# Patient Record
Sex: Female | Born: 1975 | Race: Black or African American | Hispanic: No | Marital: Married | Smoking: Never smoker
Health system: Southern US, Community
[De-identification: ages and names within clinical notes are randomized; demographics above are authoritative.]

## PROBLEM LIST (undated history)

## (undated) DIAGNOSIS — R011 Cardiac murmur, unspecified: Secondary | ICD-10-CM

## (undated) DIAGNOSIS — I10 Essential (primary) hypertension: Secondary | ICD-10-CM

## (undated) DIAGNOSIS — I341 Nonrheumatic mitral (valve) prolapse: Secondary | ICD-10-CM

## (undated) DIAGNOSIS — R87619 Unspecified abnormal cytological findings in specimens from cervix uteri: Secondary | ICD-10-CM

## (undated) DIAGNOSIS — K219 Gastro-esophageal reflux disease without esophagitis: Secondary | ICD-10-CM

## (undated) DIAGNOSIS — E669 Obesity, unspecified: Secondary | ICD-10-CM

## (undated) DIAGNOSIS — IMO0002 Reserved for concepts with insufficient information to code with codable children: Secondary | ICD-10-CM

## (undated) DIAGNOSIS — D649 Anemia, unspecified: Secondary | ICD-10-CM

## (undated) DIAGNOSIS — D259 Leiomyoma of uterus, unspecified: Secondary | ICD-10-CM

## (undated) HISTORY — DX: Anemia, unspecified: D64.9

## (undated) HISTORY — DX: Cardiac murmur, unspecified: R01.1

## (undated) HISTORY — DX: Unspecified abnormal cytological findings in specimens from cervix uteri: R87.619

## (undated) HISTORY — DX: Obesity, unspecified: E66.9

## (undated) HISTORY — DX: Essential (primary) hypertension: I10

## (undated) HISTORY — PX: LEEP: SHX91

## (undated) HISTORY — DX: Reserved for concepts with insufficient information to code with codable children: IMO0002

## (undated) HISTORY — DX: Nonrheumatic mitral (valve) prolapse: I34.1

## (undated) HISTORY — DX: Leiomyoma of uterus, unspecified: D25.9

## (undated) HISTORY — PX: WISDOM TOOTH EXTRACTION: SHX21

## (undated) HISTORY — DX: Gastro-esophageal reflux disease without esophagitis: K21.9

---

## 2005-03-28 ENCOUNTER — Observation Stay: Payer: Self-pay | Admitting: Obstetrics and Gynecology

## 2005-03-28 ENCOUNTER — Ambulatory Visit: Payer: Self-pay | Admitting: Obstetrics and Gynecology

## 2005-05-17 ENCOUNTER — Observation Stay: Payer: Self-pay | Admitting: Obstetrics and Gynecology

## 2005-05-20 ENCOUNTER — Inpatient Hospital Stay: Payer: Self-pay | Admitting: Obstetrics and Gynecology

## 2006-04-22 ENCOUNTER — Ambulatory Visit: Payer: Self-pay | Admitting: Family Medicine

## 2006-05-01 ENCOUNTER — Ambulatory Visit (HOSPITAL_COMMUNITY): Admission: RE | Admit: 2006-05-01 | Discharge: 2006-05-01 | Payer: Self-pay | Admitting: Gynecology

## 2006-05-19 ENCOUNTER — Ambulatory Visit: Payer: Self-pay | Admitting: Family Medicine

## 2006-06-11 ENCOUNTER — Ambulatory Visit (HOSPITAL_COMMUNITY): Admission: RE | Admit: 2006-06-11 | Discharge: 2006-06-11 | Payer: Self-pay | Admitting: Family Medicine

## 2006-06-16 ENCOUNTER — Ambulatory Visit: Payer: Self-pay | Admitting: Family Medicine

## 2006-07-10 ENCOUNTER — Ambulatory Visit: Payer: Self-pay | Admitting: Family Medicine

## 2006-07-14 ENCOUNTER — Ambulatory Visit: Payer: Self-pay | Admitting: Family Medicine

## 2006-07-17 ENCOUNTER — Ambulatory Visit (HOSPITAL_COMMUNITY): Admission: RE | Admit: 2006-07-17 | Discharge: 2006-07-17 | Payer: Self-pay | Admitting: Gynecology

## 2006-08-11 ENCOUNTER — Ambulatory Visit: Payer: Self-pay | Admitting: Family Medicine

## 2006-09-10 ENCOUNTER — Ambulatory Visit: Payer: Self-pay | Admitting: Obstetrics & Gynecology

## 2006-09-10 ENCOUNTER — Ambulatory Visit (HOSPITAL_COMMUNITY): Admission: RE | Admit: 2006-09-10 | Discharge: 2006-09-10 | Payer: Self-pay | Admitting: Family Medicine

## 2006-09-21 ENCOUNTER — Ambulatory Visit: Payer: Self-pay | Admitting: Gynecology

## 2006-09-30 ENCOUNTER — Ambulatory Visit: Payer: Self-pay | Admitting: Obstetrics and Gynecology

## 2006-09-30 ENCOUNTER — Inpatient Hospital Stay (HOSPITAL_COMMUNITY): Admission: AD | Admit: 2006-09-30 | Discharge: 2006-09-30 | Payer: Self-pay | Admitting: Family Medicine

## 2006-10-06 ENCOUNTER — Ambulatory Visit: Payer: Self-pay | Admitting: Gynecology

## 2006-10-17 ENCOUNTER — Inpatient Hospital Stay (HOSPITAL_COMMUNITY): Admission: AD | Admit: 2006-10-17 | Discharge: 2006-10-18 | Payer: Self-pay | Admitting: Obstetrics & Gynecology

## 2006-10-17 ENCOUNTER — Ambulatory Visit: Payer: Self-pay | Admitting: Physician Assistant

## 2006-10-19 ENCOUNTER — Ambulatory Visit: Payer: Self-pay | Admitting: Gynecology

## 2006-11-11 ENCOUNTER — Ambulatory Visit: Payer: Self-pay | Admitting: Obstetrics & Gynecology

## 2006-11-18 ENCOUNTER — Ambulatory Visit: Payer: Self-pay | Admitting: Obstetrics & Gynecology

## 2006-11-26 ENCOUNTER — Ambulatory Visit: Payer: Self-pay | Admitting: Obstetrics & Gynecology

## 2006-12-03 ENCOUNTER — Ambulatory Visit: Payer: Self-pay | Admitting: Obstetrics & Gynecology

## 2006-12-04 ENCOUNTER — Ambulatory Visit (HOSPITAL_COMMUNITY): Admission: RE | Admit: 2006-12-04 | Discharge: 2006-12-04 | Payer: Self-pay | Admitting: Obstetrics & Gynecology

## 2006-12-08 ENCOUNTER — Ambulatory Visit: Payer: Self-pay | Admitting: Physician Assistant

## 2006-12-08 ENCOUNTER — Inpatient Hospital Stay (HOSPITAL_COMMUNITY): Admission: AD | Admit: 2006-12-08 | Discharge: 2006-12-10 | Payer: Self-pay | Admitting: Obstetrics & Gynecology

## 2007-01-21 ENCOUNTER — Ambulatory Visit: Payer: Self-pay | Admitting: Obstetrics & Gynecology

## 2007-02-16 ENCOUNTER — Ambulatory Visit: Payer: Self-pay | Admitting: Family Medicine

## 2007-02-25 ENCOUNTER — Ambulatory Visit: Payer: Self-pay | Admitting: Obstetrics & Gynecology

## 2008-01-10 ENCOUNTER — Ambulatory Visit: Payer: Self-pay | Admitting: Family Medicine

## 2008-04-03 ENCOUNTER — Ambulatory Visit: Payer: Self-pay | Admitting: Family Medicine

## 2008-04-13 ENCOUNTER — Encounter: Admission: RE | Admit: 2008-04-13 | Discharge: 2008-04-13 | Payer: Self-pay | Admitting: Family Medicine

## 2008-04-21 ENCOUNTER — Encounter: Admission: RE | Admit: 2008-04-21 | Discharge: 2008-04-21 | Payer: Self-pay | Admitting: Family Medicine

## 2008-04-21 ENCOUNTER — Ambulatory Visit: Payer: Self-pay | Admitting: Family Medicine

## 2008-07-19 ENCOUNTER — Ambulatory Visit: Payer: Self-pay | Admitting: Obstetrics and Gynecology

## 2008-10-05 ENCOUNTER — Ambulatory Visit: Payer: Self-pay | Admitting: Family Medicine

## 2008-11-06 ENCOUNTER — Ambulatory Visit: Payer: Self-pay | Admitting: Family Medicine

## 2009-01-03 ENCOUNTER — Ambulatory Visit: Payer: Self-pay | Admitting: Obstetrics and Gynecology

## 2009-04-10 ENCOUNTER — Ambulatory Visit: Payer: Self-pay | Admitting: Obstetrics & Gynecology

## 2009-05-22 ENCOUNTER — Ambulatory Visit: Payer: Self-pay | Admitting: Family Medicine

## 2010-06-12 ENCOUNTER — Ambulatory Visit: Payer: Self-pay | Admitting: Family Medicine

## 2010-06-17 ENCOUNTER — Ambulatory Visit: Payer: Self-pay | Admitting: Obstetrics and Gynecology

## 2010-07-18 ENCOUNTER — Ambulatory Visit: Payer: Self-pay

## 2010-11-19 NOTE — Assessment & Plan Note (Signed)
NAME:  [name], [name]                 ACCOUNT NO.:  [id number]   MEDICAL RECORD NO.:  [id number]          PATIENT TYPE:  POB   LOCATION:  [facility] at [facility]         FACILITY:  [facility]   PHYSICIAN:  Tinnie Gens, MD        DATE OF BIRTH:  [birth year]   DATE OF SERVICE:  06/12/2010                                  CLINIC NOTE   CHIEF COMPLAINT:  Yearly exam.   HISTORY OF PRESENT ILLNESS:  The patient is a 35 year old gravida 5,  para 5 who comes in today for yearly exam.  She reports that her cycles  are coming every 2-3 months and then she has heavy cycles, which she  says she has always had heavy cycles and these are not new, just is  skipping a month or two in between them is new.  She does have a known  history of fibroid uterus.  The patient also has a strong family history  of breast cancer.  The patient's partner has had a vasectomy and so  birth control is no longer needed.   PAST MEDICAL HISTORY:  She has a history of heart murmur with a normal  echo and cardiac eval.   PAST SURGICAL HISTORY:  Negative.   MEDICATIONS:  None.   ALLERGIES:  None known.   OBSTETRICAL HISTORY:  She is a G5, P5 with 5 vaginal deliveries.   GYNECOLOGIC HISTORY:  History of a LEEP in 1990 with subsequently normal  Pap smears.  No history of STDs.   FAMILY HISTORY:  Mom was diagnosed with breast cancer at age 15,  deceased at age 54.  Maternal aunt diagnosed with breast cancer, also  deceased at age 10.  The patient has regular mammograms, the last of  which was October 2010.  She is due for this again.   REVIEW OF SYSTEMS:  A 14-point review of systems reviewed.  She denies  chest pain, shortness of breath, nausea, vomiting, diarrhea,  constipation, trouble with her vision, trouble with hearing, swelling of  feet or ankles, masses in her breast.   PHYSICAL EXAMINATION:  VITAL SIGNS:  Her vitals are as noted in the  chart.  Weight is 171 pounds.  GENERAL:  She is well-developed, well-nourished  female in no acute  distress.  HEENT:  Normocephalic, atraumatic.  Sclerae anicteric.  NECK:  Supple.  Normal thyroid.  LUNGS:  Clear bilaterally.  CV:  Regular rate and rhythm without rubs, gallops, or murmurs.  ABDOMEN:  Soft, nontender, nondistended.  EXTREMITIES:  No cyanosis, clubbing, or edema.  BREASTS:  Symmetric with everted nipples.  No supraclavicular or  axillary adenopathy.  GU:  Normal external female genitalia.  BUS is normal.  Vagina is pink  and rugated.  Cervix is anterior.  The uterus is enlarged and firm, it  is probably about 8 weeks' size, was retroverted.  There are no adnexal  mass or tenderness.   IMPRESSION:  1. Gynecologic exam.  2. Strong family history of breast cancer.  3. Oligomenorrhea of unclear etiology.   PLAN:  1. Fasting TSH, lipid, CBC, and CMP.  2. Pap smear today.  3. Routine mammogram.  4.  Provera 10 mg one p.o. daily for 5 days every other month to ensure      she has a cycle.  5. After lengthy discussion has had with her, I think she should have      BRCA testing.  She is given information on this and hopefully, she      will return for that testing.           ______________________________  Tinnie Gens, MD     TP/MEDQ  D:  06/12/2010  T:  06/13/2010  Job:  [id number]

## 2010-11-19 NOTE — Assessment & Plan Note (Signed)
[name], [name]                 ACCOUNT NO.:  [id number]   MEDICAL RECORD NO.:  [id number]          PATIENT TYPE:  POB   LOCATION:  [facility]         FACILITY:  [facility]   PHYSICIAN:  Jaynie Collins, MD     DATE OF BIRTH:  1975/08/28   DATE OF SERVICE:  04/10/2009                                  CLINIC NOTE   The patient is a 35 year old gravida 5, para 5 who is here for her  annual examination.  The patient was last seen in January 03, 2009 when she  received her Depo-Provera injection that was her last ejection given  that her husband received a vasectomy in July.  Since around September,  the patient had noted that which her last menstrual period has started  on March 12, 2009, she had 3 weeks of spotting and increased  abdominal cramping and she continues to have abdominal cramping right  now.  No rebound or guarding but the spotting has resolved.  She is  worried about this irregular bleeding.  The patient is scheduled for her  next mammogram on April 27, 2009.  She does have significant family  history of early onset breast cancer.   PAST OB/GYN HISTORY:  The patient is a gravida 5, para 5 with five  vaginal deliveries.  She had regular menstrual periods until she has  been off Depo-Provera in the last few months.  She has a history of a  LEEP in 1990 and subsequent Pap smears were normal including the one on  April 03, 2008.  Her husband got a vasectomy for contraception.  She  is sexually active only with her husband and denies any history of  sexually transmitted infections.   PAST MEDICAL HISTORY:  History of heart murmur but had a normal  echocardiogram and cardiac evaluation.   PAST SURGICAL HISTORY:  Negative.   MEDICATIONS:  None.   ALLERGIES:  No known drug allergies.   SOCIAL HISTORY:  The patient works for [employer].  She denies any tobacco,  alcohol or illicit drug use.  She denies any physical, emotional or  sexual abuse.   FAMILY HISTORY:   Remarkable for her mom being diagnosed with breast  cancer in her 29s and passing away at age 69 and her maternal aunt being  diagnosed with breast cancer and also died of it at age 57.  The patient  has been having regular mammograms.  Her last one was in April 13, 2008, which was normal.  She is scheduled for one on April 27, 2009.  The patient denies any other family history of ovarian cancer or colon  cancer or any other gynecologic cancer.  There is also an extensive  family history of diabetes and hypertension.   REVIEW OF SYSTEMS:  A comprehensive review of systems was reviewed with  the patient and was entirely negative apart from what was mentioned in  the history of present illness.   PHYSICAL EXAMINATION:  VITAL SIGNS:  Pulse is 88, blood pressure 140/77,  weight 169, height 5 feet.  GENERAL:  No apparent distress.  HEENT:  Normocephalic, atraumatic.  NECK:  Supple.  No masses.  Normal thyroid.  LUNGS:  Clear to auscultation bilaterally.  HEART:  Regular rate and rhythm.  BREASTS:  Symmetric and soft, nontender, no abnormal masses, skin  changes, nipple discharge or lymphadenopathy noted.  ABDOMEN:  Soft,  nontender, nondistended.  No organomegaly.  There was mild suprapubic  tenderness on palpation.  EXTREMITIES:  No cyanosis, clubbing or edema.  PELVIC:  Normal external genitalia, pink, well-rugated vagina,  multiparous cervix noted.  Pap smear was obtained.  We will check a  reflex gonorrhea and chlamydia from the Pap smear sample.  On bimanual  exam, the patient has small retroverted mobile uterus and nontender  adnexae.  No abnormal masses palpated.  There was some mild tenderness  on palpation of her uterus.   ASSESSMENT AND PLAN:  The patient is a 33-year gravida 5, para 5, here  for annual exam.  The patient had a normal breast examination.  We will  follow up on the mammogram especially giving her family history of  breast cancer.  The patient is  contemplating BRCA analysis but has not  made a discussion about this yet.  We will also follow up on the Pap  smear and gonorrhea and chlamydia screen and the patient's abnormal  bleeding is likely due to her bodies getting back to normal after being  on Depo-Provera for a while.  She was told that it was an unusual to  have abnormal bleeding or cramping as the body tries to get back to  normal after a prolonged course of hormonal therapy.  However, we will  check a urinalysis and also check a pelvic ultrasound to rule out any  structural anomalies that he causing her symptoms.  The patient is  agreeable to this plan.  The patient was told to follow up for any  further gynecologic concerns or in 1 year for annual examination.           ______________________________  Jaynie Collins, MD     UA/MEDQ  D:  04/10/2009  T:  04/11/2009  Job:  [id number]

## 2010-11-19 NOTE — Assessment & Plan Note (Signed)
[name], [name]                 ACCOUNT NO.:  [id number]   MEDICAL RECORD NO.:  [id number]           PATIENT TYPE:   LOCATION:  [facility]           FACILITY:   PHYSICIAN:  Tinnie Gens, MD        DATE OF BIRTH:  11-22-1975   DATE OF SERVICE:  11/06/2008                                  CLINIC NOTE   CHIEF COMPLAINT:  Tubal consult.   HISTORY OF PRESENT ILLNESS:  The patient is a 35 year old, gravida 5,  para 5, who desires permanent sterility.  She has been counseled  regarding risks and benefits of procedure and desires to have it done.  The patient has been on Depo, her last shot was on October 05, 2008.   PAST MEDICAL HISTORY:  History of heart murmur with a normal echo.   PAST SURGICAL HISTORY:  Negative.   MEDICATIONS:  Depo-Provera 150 mg every 3 months.   ALLERGIES:  None known.   OBSTETRICAL HISTORY:  G5, P5, 5 vaginal deliveries.   GYNECOLOGIC HISTORY:  History of LEEP in 1990.  Last Pap on September  2009 which was within normal limits.   FAMILY HISTORY:  Diabetes, hypertension, breast cancer.   SOCIAL HISTORY:  No tobacco, alcohol, or drug use.  She works for  [employer].  She has her own insurance.   On exam, vitals are as noted in the chart.  She is a well-developed,  well-nourished female in no acute distress.  Blood pressure 140/75.  Her  abdomen is soft, nontender, nondistended.   IMPRESSION:  Tubal consult.   PLAN:  We will schedule for tubal.  The patient was counseled regarding  risks and benefits of the procedure including risk of bleeding,  infection, injury to surrounding organs, what to expect during surgery,  risk of failure as 1  in 100, increased risk of ectopic should it fail, and the permanency of  the procedure.  The patient understand these risks.  She will be  scheduled in the next 1-2 weeks prior to __________.            ______________________________  Tinnie Gens, MD     TP/MEDQ  D:  11/06/2008  T:  11/07/2008  Job:   [id number]

## 2010-11-19 NOTE — Assessment & Plan Note (Signed)
NAMEJASMEN, [name]                 ACCOUNT NO.:  [id number]   MEDICAL RECORD NO.:  [id number]          PATIENT TYPE:  POB   LOCATION:  [facility]         FACILITY:  [facility]   PHYSICIAN:  Tinnie Gens, MD        DATE OF BIRTH:  02/01/1976   DATE OF SERVICE:  04/03/2008                                  CLINIC NOTE   CHIEF COMPLAINT:  Yearly exam.   HISTORY OF PRESENT ILLNESS:  The patient is a 35 year old gravida 5,  para 5 who is here for yearly exam.  She is without significant  complaint today.  The patient is using Depo for contraception which  seems to be working well since her IUD fell out in July 2009.  The  patient has a strong family history of breast cancer in her mother and a  maternal aunt, both of whom died over diagnosed prior to age 37.   PAST MEDICAL HISTORY:  History of heart murmur.  She had an echo which  was reportedly normal.   PAST SURGICAL HISTORY:  Negative.   MEDICATIONS:  She is on iron one p.o. daily, Depo-Provera 150 mg every 3  months.   ALLERGIES:  None known.   OBSTETRICAL HISTORY:  She is a G5, P 5 with 5 vaginal deliveries.   GYN HISTORY:  History of LEEP in 1990, last Pap was May 19, 2006  which was within normal limits.   FAMILY HISTORY:  Diabetes, hypertension, breast cancer.  Mom is deceased  at age 74 diagnosed at 36-15 years of age.  Aunt deceased at age 62 also  from her mother's side.  No ovarian cancer.  No BRCA testing done.   SOCIAL HISTORY:  No tobacco, alcohol, or drug use.  She works for  [employer].   REVIEW OF SYSTEMS:  A 14-point review of systems is reviewed and is  positive for constipation, hair falling out for approximately 1 year,  hot and cold flashes with increased fatigue recently.  The patient is  unsure if her constipation is related to iron shots.  The patient denies  chest pain, shortness of breath, breast lump or masses, vaginal  discharge.  She has had no cycle since starting her Depo-Provera.  She  denies dysuria, hematuria, blood in her stools, numbness, weakness,  fevers, chills.   PHYSICAL EXAMINATION:  VITAL SIGNS:  As noted in the chart.  Weight is  156 which is down 8 pounds from her IUD was inserted.  Blood pressure  139/75.  GENERAL:  She is a well-nourished and well-nourished female in no acute  distress.  NECK:  Supple.  Normal thyroid.  LUNGS:  Clear bilaterally.  CV:  Regular rate and rhythm. No rubs, gallops, or murmurs.  ABDOMEN:  Soft, nontender, and nondistended.  EXTREMITIES:  No cyanosis, clubbing, or edema.  BREASTS:  Symmetric with everted nipples.  No masses.  No  supraclavicular or axillary adenopathy.  GU:  Normal external female genitalia.  BUS is normal.  Vagina is pink  and rugated.  Cervix is parous without lesions.  Uterus is small,  anteverted.  No adnexal mass or tenderness with good exam possible.  IMPRESSION:  1. Yearly exam.  2. Fatigue and alopecia  3. Strong family history of breast cancer.   PLAN:  1. Pap smear today.  2. Check CBC and TSH.  3. Screening mammogram.  4. The patient should continue with yearly pelvic so she was given      information about BRCA testing.  5. The patient was told strictly to do self-breast check monthly.           ______________________________  Tinnie Gens, MD     TP/MEDQ  D:  04/03/2008  T:  04/04/2008  Job:  [id number]

## 2010-11-19 NOTE — Assessment & Plan Note (Signed)
NAMEELISABEL, [name]                 ACCOUNT NO.:  [id number]   MEDICAL RECORD NO.:  [id number]          PATIENT TYPE:  POB   LOCATION:  [facility]         FACILITY:  [facility]   PHYSICIAN:  Elsie Lincoln, MD      DATE OF BIRTH:  1975-12-20   DATE OF SERVICE:  01/21/2007                                  CLINIC NOTE   HISTORY OF PRESENT ILLNESS:  The patient is a 35 year old female who  delivered vaginally, 8+ pound infant approximately 6 weeks ago. She had,  what we thought was gestational hypertension, however, now her blood  pressure is 137/85, so she could be a borderline hypertensive. The  patient is instructed to followup yearly with her primary care physician  to make sure that she does not develop hypertension. She is also  supposed to get a tubal ligation, however, she kept getting bumped for  emergency cesarean section, if she opted for a Mirena. We discussed that  today in detail and she thinks that it would be a good option for her.  The patient is not experiencing any depression. She is not breast  feeding. She is amenorrheic secondary to having a shot of Depo before  discharge.   PHYSICAL EXAMINATION:  ABDOMEN:  Soft, nontender, and nondistended. No  rebound, no guarding.  PELVIC:  Genitalia tender at 5. Vagina slightly atrophic. Cervix closed  and nontender .Uterus slightly enlarged, nontender. Adnexa, no masses,  nontender.   ASSESSMENT/PLAN:  A 35 year old female with normal postpartum  examination.  1. The patient for Mirena IUD. GC and chlamydia cultures were negative      in May. We will not repeat those.  2. Return to clinic in 2 to 3 weeks and pre-medicate with Motrin prior      to getting the Mirena.           ______________________________  Elsie Lincoln, MD     KL/MEDQ  D:  01/21/2007  T:  01/21/2007  Job:  [id number]

## 2010-11-19 NOTE — Assessment & Plan Note (Signed)
NAMEROBINA, HAMOR                 ACCOUNT NO.:  [id number]   MEDICAL RECORD NO.:  [id number]          PATIENT TYPE:  POB   LOCATION:  [facility]         FACILITY:  [facility]   PHYSICIAN:  Tinnie Gens, MD        DATE OF BIRTH:  05/17/1976   DATE OF SERVICE:  05/22/2009                                  CLINIC NOTE   CHIEF COMPLAINT:  Followup results.   HISTORY OF PRESENT ILLNESS:  The patient is a 35 year old gravida 5,  para 5 who had come in previously with some cramping and spotting that  was thought to be may be related to her Depo-Provera.  She did undergo  pelvic sonogram and is here for followup of results of that.  The  patient additionally had a TSH checked which was normal, hemoglobin was  normal at 12.7.  Pelvic sonography showed 2 small fibroids one in the  mid posterior uterus measuring 2.8 x 2.0 x 2.2 and the second lying in  the mid posterior body that measured 2.1 x 1.5 x 1.9.  It is unclear if  this is the exact cause of her cramping.  She reports that she is having  to take ibuprofen every day.  She is having painful intercourse.  Lengthy discussion was had with the patient regarding the possibility of  this being the cause.  It may be that the Depo-Provera had previously  masked the symptoms of leiomyomata or maybe this is just related to the  Depo-Provera wearing off.  Discussion of different treatment options  were discussed with her including myomectomy, hysterectomy, uterine  artery embolization, MRI guided ultrasound and lots of information was  printed about te different techniques, different treatment options as  well as just doing nothing and watchful waiting attitude which the  patient has elected to do for now.  She will follow up in 2 to 3 months  to see how things are going.  Additionally, she can call back in and  return sooner.  Medical treatments were also discussed although the  futility of those were discussed at length.     ______________________________  Tinnie Gens, MD     TP/MEDQ  D:  05/22/2009  T:  05/23/2009  Job:  [id number]

## 2010-11-19 NOTE — Assessment & Plan Note (Signed)
[name], [name]                 ACCOUNT NO.:  [id number]   MEDICAL RECORD NO.:  [id number]          PATIENT TYPE:  POB   LOCATION:  [facility]         FACILITY:  [facility]   PHYSICIAN:  Tinnie Gens, MD        DATE OF BIRTH:  Jun 25, 1976   DATE OF SERVICE:                                  CLINIC NOTE   CHIEF COMPLAINT:  IUD insertion.   HISTORY OF PRESENT ILLNESS:  The patient is a 34 year old gravida 5,  para 5 who is 8 weeks postpartum from a vaginal delivery.  She had Depo-  Provera prior to discharge from the hospital.  She had received her  postpartum exam.  She desires long term sterilization including IUD  insertion  she has been counseled on the risks and  benefits of the  procedure and agrees to proceed.   PROCEDURE:  The speculum is placed in the vagina and the cervix is  visualized, cleaned with Betadine x2, grasped anteriorly with the single  tooth tenaculum. The uterus was sounded to 7 cm.  IUD is inserted  without difficulty.  Strings are trimmed to 1.5 cm.  All instruments are  removed from the vagina. The patient tolerated the procedure well.   IMPRESSION:  Undesired fertility, status post IUD insertion.   PLAN:  Followup in 2 weeks for string check.           ______________________________  Tinnie Gens, MD     TP/MEDQ  D:  02/16/2007  T:  02/17/2007  Job:  [id number]

## 2010-11-22 NOTE — Assessment & Plan Note (Signed)
NAMEDOSHIE, MAGGI                 ACCOUNT NO.:  [id number]   MEDICAL RECORD NO.:  [id number]          PATIENT TYPE:  POB   LOCATION:  [facility]         FACILITY:  [facility]   PHYSICIAN:  Tinnie Gens, MD        DATE OF BIRTH:  07-06-1976   DATE OF SERVICE:                                    CLINIC NOTE   Real time transvaginal ultrasound was performed on this patient.  The uterus  was noted to have a single living intrauterine pregnancy with a __________  consistent with her dating, measuring 10 weeks 6 days.  There was no free  fluid.  The uterus was 8.6 cm, cervix 4.1 cm together with a measurement of  13 cm.  The patient's right ovary measured 3.6 x 3.5 cm.  The patient's left  ovary measured 2.6 x 2.5 cm.  There was no free fluid in the cul-de-sac.  IUP was noted to have spontaneous movement with regular fetal heart rate.           ______________________________  Tinnie Gens, MD     TP/MEDQ  D:  05/19/2006  T:  05/19/2006  Job:  [id number]

## 2011-04-24 LAB — CBC
HCT: 33.4 — ABNORMAL LOW
Hemoglobin: 10.9 — ABNORMAL LOW
MCHC: 32.5
MCV: 88.7
Platelets: 298
RBC: 3.77 — ABNORMAL LOW
RDW: 15.4 — ABNORMAL HIGH
WBC: 14.7 — ABNORMAL HIGH

## 2011-04-24 LAB — RPR: RPR Ser Ql: NONREACTIVE

## 2011-06-10 ENCOUNTER — Telehealth: Payer: Self-pay | Admitting: Family Medicine

## 2011-06-10 NOTE — Telephone Encounter (Signed)
[name] has a yeast infection and would like to have some diflucan called in.

## 2011-06-11 MED ORDER — FLUCONAZOLE 150 MG PO TABS: 150.0000 mg | ORAL_TABLET | Freq: Once | ORAL | Status: AC

## 2011-06-11 NOTE — Telephone Encounter (Signed)
Addended by: Barbara Cower on: 06/11/2011 11:38 AM   Modules accepted: Orders

## 2011-06-11 NOTE — Telephone Encounter (Signed)
Rx called to pharmacy for patient.

## 2011-06-16 LAB — LIPID PANEL
Cholesterol: 183 mg/dL (ref 0–200)
HDL: 55 mg/dL (ref 35–70)
LDL Cholesterol: 20 mg/dL

## 2011-12-29 ENCOUNTER — Other Ambulatory Visit: Payer: Self-pay | Admitting: Family Medicine

## 2011-12-29 ENCOUNTER — Ambulatory Visit (INDEPENDENT_AMBULATORY_CARE_PROVIDER_SITE_OTHER): Payer: Private Health Insurance - Indemnity | Admitting: Family Medicine

## 2011-12-29 ENCOUNTER — Encounter: Payer: Self-pay | Admitting: Family Medicine

## 2011-12-29 VITALS — BP 131/88 | HR 80 | Ht 60.0 in | Wt 162.0 lb

## 2011-12-29 DIAGNOSIS — Z1151 Encounter for screening for human papillomavirus (HPV): Secondary | ICD-10-CM

## 2011-12-29 DIAGNOSIS — D259 Leiomyoma of uterus, unspecified: Secondary | ICD-10-CM | POA: Insufficient documentation

## 2011-12-29 DIAGNOSIS — Z124 Encounter for screening for malignant neoplasm of cervix: Secondary | ICD-10-CM

## 2011-12-29 DIAGNOSIS — R011 Cardiac murmur, unspecified: Secondary | ICD-10-CM | POA: Insufficient documentation

## 2011-12-29 DIAGNOSIS — N946 Dysmenorrhea, unspecified: Secondary | ICD-10-CM

## 2011-12-29 DIAGNOSIS — Z01419 Encounter for gynecological examination (general) (routine) without abnormal findings: Secondary | ICD-10-CM

## 2011-12-29 DIAGNOSIS — I10 Essential (primary) hypertension: Secondary | ICD-10-CM

## 2011-12-29 DIAGNOSIS — Z803 Family history of malignant neoplasm of breast: Secondary | ICD-10-CM

## 2011-12-29 NOTE — Progress Notes (Signed)
  Subjective:     [name] is a 36 y.o. female and is here for a comprehensive physical exam. The patient reports problems - shooting pain in vagina and lower abdomen for several days prior to onset menses.  Cycles are slightly heavy but not increasing and only heavy since off hormonal contraception.Marland Kitchen  History   Social History  . Marital Status: Married    Spouse Name: N/A    Number of Children: N/A  . Years of Education: N/A   Occupational History  . Not on file.   Social History Main Topics  . Smoking status: Never Smoker   . Smokeless tobacco: Not on file  . Alcohol Use: No  . Drug Use: No  . Sexually Active: Yes -- Female partner(s)     vasectomy   Other Topics Concern  . Not on file   Social History Narrative  . No narrative on file   Health Maintenance  Topic Date Due  . Pap Smear  03/12/1992  . Tetanus/tdap  03/12/1993  . Influenza Vaccine  04/06/2012    The following portions of the patient's history were reviewed and updated as appropriate: allergies, current medications, past family history, past medical history, past social history, past surgical history and problem list.  Review of Systems A comprehensive review of systems was negative.   Objective:    BP 131/88  Pulse 80  Ht 5' (1.524 m)  Wt 162 lb (73.483 kg)  BMI 31.64 kg/m2  LMP 12/15/2011 General appearance: alert, cooperative and appears stated age Head: Normocephalic, without obvious abnormality, atraumatic Neck: no adenopathy, supple, symmetrical, trachea midline and thyroid not enlarged, symmetric, no tenderness/mass/nodules Lungs: clear to auscultation bilaterally Breasts: normal appearance, no masses or tenderness Heart: regular rate and rhythm, S1, S2 normal, no murmur, click, rub or gallop Abdomen: soft, non-tender; bowel sounds normal; no masses,  no organomegaly Pelvic: cervix normal in appearance, external genitalia normal, no adnexal masses or tenderness, no cervical motion  tenderness, vagina normal without discharge and uterus firm and 8 wk size Extremities: extremities normal, atraumatic, no cyanosis or edema Pulses: 2+ and symmetric Skin: Skin color, texture, turgor normal. No rashes or lesions Lymph nodes: Cervical, supraclavicular, and axillary nodes normal. Neurologic: Grossly normal    Assessment:    Healthy female exam.   Fibroids.  Minimal dysmenorrhea.  Family h/o breast Ca-BRCA -ve.   Plan:    Pap smear and mammogram Trial of NSAIDS when onset of menses.  RTC if no improvement. See After Visit Summary for Counseling Recommendations

## 2011-12-29 NOTE — Patient Instructions (Signed)
Preventive Care for Adults, Female A healthy lifestyle and preventive care can promote health and wellness. Preventive health guidelines for women include the following key practices.  A routine yearly physical is a good way to check with your caregiver about your health and preventive screening. It is a chance to share any concerns and updates on your health, and to receive a thorough exam.   Visit your dentist for a routine exam and preventive care every 6 months. Brush your teeth twice a day and floss once a day. Good oral hygiene prevents tooth decay and gum disease.   The frequency of eye exams is based on your age, health, family medical history, use of contact lenses, and other factors. Follow your caregiver's recommendations for frequency of eye exams.   Eat a healthy diet. Foods like vegetables, fruits, whole grains, low-fat dairy products, and lean protein foods contain the nutrients you need without too many calories. Decrease your intake of foods high in solid fats, added sugars, and salt. Eat the right amount of calories for you.Get information about a proper diet from your caregiver, if necessary.   Regular physical exercise is one of the most important things you can do for your health. Most adults should get at least 150 minutes of moderate-intensity exercise (any activity that increases your heart rate and causes you to sweat) each week. In addition, most adults need muscle-strengthening exercises on 2 or more days a week.   Maintain a healthy weight. The body mass index (BMI) is a screening tool to identify possible weight problems. It provides an estimate of body fat based on height and weight. Your caregiver can help determine your BMI, and can help you achieve or maintain a healthy weight.For adults 20 years and older:   A BMI below 18.5 is considered underweight.   A BMI of 18.5 to 24.9 is normal.   A BMI of 25 to 29.9 is considered overweight.   A BMI of 30 and above is  considered obese.   Maintain normal blood lipids and cholesterol levels by exercising and minimizing your intake of saturated fat. Eat a balanced diet with plenty of fruit and vegetables. Blood tests for lipids and cholesterol should begin at age 20 and be repeated every 5 years. If your lipid or cholesterol levels are high, you are over 50, or you are at high risk for heart disease, you may need your cholesterol levels checked more frequently.Ongoing high lipid and cholesterol levels should be treated with medicines if diet and exercise are not effective.   If you smoke, find out from your caregiver how to quit. If you do not use tobacco, do not start.   If you are pregnant, do not drink alcohol. If you are breastfeeding, be very cautious about drinking alcohol. If you are not pregnant and choose to drink alcohol, do not exceed 1 drink per day. One drink is considered to be 12 ounces (355 mL) of beer, 5 ounces (148 mL) of wine, or 1.5 ounces (44 mL) of liquor.   Avoid use of street drugs. Do not share needles with anyone. Ask for help if you need support or instructions about stopping the use of drugs.   High blood pressure causes heart disease and increases the risk of stroke. Your blood pressure should be checked at least every 1 to 2 years. Ongoing high blood pressure should be treated with medicines if weight loss and exercise are not effective.   If you are 55 to 38   years old, ask your caregiver if you should take aspirin to prevent strokes.   Diabetes screening involves taking a blood sample to check your fasting blood sugar level. This should be done once every 3 years, after age 45, if you are within normal weight and without risk factors for diabetes. Testing should be considered at a younger age or be carried out more frequently if you are overweight and have at least 1 risk factor for diabetes.   Breast cancer screening is essential preventive care for women. You should practice "breast  self-awareness." This means understanding the normal appearance and feel of your breasts and may include breast self-examination. Any changes detected, no matter how small, should be reported to a caregiver. Women in their 20s and 30s should have a clinical breast exam (CBE) by a caregiver as part of a regular health exam every 1 to 3 years. After age 40, women should have a CBE every year. Starting at age 40, women should consider having a mammography (breast X-Sjogren test) every year. Women who have a family history of breast cancer should talk to their caregiver about genetic screening. Women at a high risk of breast cancer should talk to their caregivers about having magnetic resonance imaging (MRI) and a mammography every year.   The Pap test is a screening test for cervical cancer. A Pap test can show cell changes on the cervix that might become cervical cancer if left untreated. A Pap test is a procedure in which cells are obtained and examined from the lower end of the uterus (cervix).   Women should have a Pap test starting at age 21.   Between ages 21 and 29, Pap tests should be repeated every 2 years.   Beginning at age 30, you should have a Pap test every 3 years as long as the past 3 Pap tests have been normal.   Some women have medical problems that increase the chance of getting cervical cancer. Talk to your caregiver about these problems. It is especially important to talk to your caregiver if a new problem develops soon after your last Pap test. In these cases, your caregiver may recommend more frequent screening and Pap tests.   The above recommendations are the same for women who have or have not gotten the vaccine for human papillomavirus (HPV).   If you had a hysterectomy for a problem that was not cancer or a condition that could lead to cancer, then you no longer need Pap tests. Even if you no longer need a Pap test, a regular exam is a good idea to make sure no other problems are  starting.   If you are between ages 65 and 70, and you have had normal Pap tests going back 10 years, you no longer need Pap tests. Even if you no longer need a Pap test, a regular exam is a good idea to make sure no other problems are starting.   If you have had past treatment for cervical cancer or a condition that could lead to cancer, you need Pap tests and screening for cancer for at least 20 years after your treatment.   If Pap tests have been discontinued, risk factors (such as a new sexual partner) need to be reassessed to determine if screening should be resumed.   The HPV test is an additional test that may be used for cervical cancer screening. The HPV test looks for the virus that can cause the cell changes on the cervix.   The cells collected during the Pap test can be tested for HPV. The HPV test could be used to screen women aged 30 years and older, and should be used in women of any age who have unclear Pap test results. After the age of 30, women should have HPV testing at the same frequency as a Pap test.   Colorectal cancer can be detected and often prevented. Most routine colorectal cancer screening begins at the age of 50 and continues through age 75. However, your caregiver may recommend screening at an earlier age if you have risk factors for colon cancer. On a yearly basis, your caregiver may provide home test kits to check for hidden blood in the stool. Use of a small camera at the end of a tube, to directly examine the colon (sigmoidoscopy or colonoscopy), can detect the earliest forms of colorectal cancer. Talk to your caregiver about this at age 50, when routine screening begins. Direct examination of the colon should be repeated every 5 to 10 years through age 75, unless early forms of pre-cancerous polyps or small growths are found.   Hepatitis C blood testing is recommended for all people born from 1945 through 1965 and any individual with known risks for hepatitis C.    Practice safe sex. Use condoms and avoid high-risk sexual practices to reduce the spread of sexually transmitted infections (STIs). STIs include gonorrhea, chlamydia, syphilis, trichomonas, herpes, HPV, and human immunodeficiency virus (HIV). Herpes, HIV, and HPV are viral illnesses that have no cure. They can result in disability, cancer, and death. Sexually active women aged 25 and younger should be checked for chlamydia. Older women with new or multiple partners should also be tested for chlamydia. Testing for other STIs is recommended if you are sexually active and at increased risk.   Osteoporosis is a disease in which the bones lose minerals and strength with aging. This can result in serious bone fractures. The risk of osteoporosis can be identified using a bone density scan. Women ages 65 and over and women at risk for fractures or osteoporosis should discuss screening with their caregivers. Ask your caregiver whether you should take a calcium supplement or vitamin D to reduce the rate of osteoporosis.   Menopause can be associated with physical symptoms and risks. Hormone replacement therapy is available to decrease symptoms and risks. You should talk to your caregiver about whether hormone replacement therapy is right for you.   Use sunscreen with sun protection factor (SPF) of 30 or more. Apply sunscreen liberally and repeatedly throughout the day. You should seek shade when your shadow is shorter than you. Protect yourself by wearing long sleeves, pants, a wide-brimmed hat, and sunglasses year round, whenever you are outdoors.   Once a month, do a whole body skin exam, using a mirror to look at the skin on your back. Notify your caregiver of new moles, moles that have irregular borders, moles that are larger than a pencil eraser, or moles that have changed in shape or color.   Stay current with required immunizations.   Influenza. You need a dose every fall (or winter). The composition of  the flu vaccine changes each year, so being vaccinated once is not enough.   Pneumococcal polysaccharide. You need 1 to 2 doses if you smoke cigarettes or if you have certain chronic medical conditions. You need 1 dose at age 65 (or older) if you have never been vaccinated.   Tetanus, diphtheria, pertussis (Tdap, Td). Get 1 dose of   Tdap vaccine if you are younger than age 65, are over 65 and have contact with an infant, are a healthcare worker, are pregnant, or simply want to be protected from whooping cough. After that, you need a Td booster dose every 10 years. Consult your caregiver if you have not had at least 3 tetanus and diphtheria-containing shots sometime in your life or have a deep or dirty wound.   HPV. You need this vaccine if you are a woman age 26 or younger. The vaccine is given in 3 doses over 6 months.   Measles, mumps, rubella (MMR). You need at least 1 dose of MMR if you were born in 1957 or later. You may also need a second dose.   Meningococcal. If you are age 19 to 21 and a first-year college student living in a residence hall, or have one of several medical conditions, you need to get vaccinated against meningococcal disease. You may also need additional booster doses.   Zoster (shingles). If you are age 60 or older, you should get this vaccine.   Varicella (chickenpox). If you have never had chickenpox or you were vaccinated but received only 1 dose, talk to your caregiver to find out if you need this vaccine.   Hepatitis A. You need this vaccine if you have a specific risk factor for hepatitis A virus infection or you simply wish to be protected from this disease. The vaccine is usually given as 2 doses, 6 to 18 months apart.   Hepatitis B. You need this vaccine if you have a specific risk factor for hepatitis B virus infection or you simply wish to be protected from this disease. The vaccine is given in 3 doses, usually over 6 months.  Preventive Services /  Frequency Ages 19 to 39  Blood pressure check.** / Every 1 to 2 years.   Lipid and cholesterol check.** / Every 5 years beginning at age 20.   Clinical breast exam.** / Every 3 years for women in their 20s and 30s.   Pap test.** / Every 2 years from ages 21 through 29. Every 3 years starting at age 30 through age 65 or 70 with a history of 3 consecutive normal Pap tests.   HPV screening.** / Every 3 years from ages 30 through ages 65 to 70 with a history of 3 consecutive normal Pap tests.   Hepatitis C blood test.** / For any individual with known risks for hepatitis C.   Skin self-exam. / Monthly.   Influenza immunization.** / Every year.   Pneumococcal polysaccharide immunization.** / 1 to 2 doses if you smoke cigarettes or if you have certain chronic medical conditions.   Tetanus, diphtheria, pertussis (Tdap, Td) immunization. / A one-time dose of Tdap vaccine. After that, you need a Td booster dose every 10 years.   HPV immunization. / 3 doses over 6 months, if you are 26 and younger.   Measles, mumps, rubella (MMR) immunization. / You need at least 1 dose of MMR if you were born in 1957 or later. You may also need a second dose.   Meningococcal immunization. / 1 dose if you are age 19 to 21 and a first-year college student living in a residence hall, or have one of several medical conditions, you need to get vaccinated against meningococcal disease. You may also need additional booster doses.   Varicella immunization.** / Consult your caregiver.   Hepatitis A immunization.** / Consult your caregiver. 2 doses, 6 to 18 months   apart.   Hepatitis B immunization.** / Consult your caregiver. 3 doses usually over 6 months.  Ages 40 to 64  Blood pressure check.** / Every 1 to 2 years.   Lipid and cholesterol check.** / Every 5 years beginning at age 20.   Clinical breast exam.** / Every year after age 40.   Mammogram.** / Every year beginning at age 40 and continuing for as  long as you are in good health. Consult with your caregiver.   Pap test.** / Every 3 years starting at age 30 through age 65 or 70 with a history of 3 consecutive normal Pap tests.   HPV screening.** / Every 3 years from ages 30 through ages 65 to 70 with a history of 3 consecutive normal Pap tests.   Fecal occult blood test (FOBT) of stool. / Every year beginning at age 50 and continuing until age 75. You may not need to do this test if you get a colonoscopy every 10 years.   Flexible sigmoidoscopy or colonoscopy.** / Every 5 years for a flexible sigmoidoscopy or every 10 years for a colonoscopy beginning at age 50 and continuing until age 75.   Hepatitis C blood test.** / For all people born from 1945 through 1965 and any individual with known risks for hepatitis C.   Skin self-exam. / Monthly.   Influenza immunization.** / Every year.   Pneumococcal polysaccharide immunization.** / 1 to 2 doses if you smoke cigarettes or if you have certain chronic medical conditions.   Tetanus, diphtheria, pertussis (Tdap, Td) immunization.** / A one-time dose of Tdap vaccine. After that, you need a Td booster dose every 10 years.   Measles, mumps, rubella (MMR) immunization. / You need at least 1 dose of MMR if you were born in 1957 or later. You may also need a second dose.   Varicella immunization.** / Consult your caregiver.   Meningococcal immunization.** / Consult your caregiver.   Hepatitis A immunization.** / Consult your caregiver. 2 doses, 6 to 18 months apart.   Hepatitis B immunization.** / Consult your caregiver. 3 doses, usually over 6 months.  Ages 65 and over  Blood pressure check.** / Every 1 to 2 years.   Lipid and cholesterol check.** / Every 5 years beginning at age 20.   Clinical breast exam.** / Every year after age 40.   Mammogram.** / Every year beginning at age 40 and continuing for as long as you are in good health. Consult with your caregiver.   Pap test.** /  Every 3 years starting at age 30 through age 65 or 70 with a 3 consecutive normal Pap tests. Testing can be stopped between 65 and 70 with 3 consecutive normal Pap tests and no abnormal Pap or HPV tests in the past 10 years.   HPV screening.** / Every 3 years from ages 30 through ages 65 or 70 with a history of 3 consecutive normal Pap tests. Testing can be stopped between 65 and 70 with 3 consecutive normal Pap tests and no abnormal Pap or HPV tests in the past 10 years.   Fecal occult blood test (FOBT) of stool. / Every year beginning at age 50 and continuing until age 75. You may not need to do this test if you get a colonoscopy every 10 years.   Flexible sigmoidoscopy or colonoscopy.** / Every 5 years for a flexible sigmoidoscopy or every 10 years for a colonoscopy beginning at age 50 and continuing until age 75.   Hepatitis   C blood test.** / For all people born from 1945 through 1965 and any individual with known risks for hepatitis C.   Osteoporosis screening.** / A one-time screening for women ages 65 and over and women at risk for fractures or osteoporosis.   Skin self-exam. / Monthly.   Influenza immunization.** / Every year.   Pneumococcal polysaccharide immunization.** / 1 dose at age 65 (or older) if you have never been vaccinated.   Tetanus, diphtheria, pertussis (Tdap, Td) immunization. / A one-time dose of Tdap vaccine if you are over 65 and have contact with an infant, are a healthcare worker, or simply want to be protected from whooping cough. After that, you need a Td booster dose every 10 years.   Varicella immunization.** / Consult your caregiver.   Meningococcal immunization.** / Consult your caregiver.   Hepatitis A immunization.** / Consult your caregiver. 2 doses, 6 to 18 months apart.   Hepatitis B immunization.** / Check with your caregiver. 3 doses, usually over 6 months.  ** Family history and personal history of risk and conditions may change your caregiver's  recommendations. Document Released: 08/19/2001 Document Revised: 06/12/2011 Document Reviewed: 11/18/2010 [employer] Patient Information 2012 [employer]. 

## 2012-01-06 LAB — HPV, LOW VOLUME (REFLEX): HPV low volume reflex: NEGATIVE

## 2012-01-06 LAB — PAP LB AND HPV HIGH-RISK: PAP Smear Comment: 0

## 2012-10-19 ENCOUNTER — Ambulatory Visit: Payer: Private Health Insurance - Indemnity | Admitting: Obstetrics and Gynecology

## 2012-10-26 ENCOUNTER — Ambulatory Visit (INDEPENDENT_AMBULATORY_CARE_PROVIDER_SITE_OTHER): Payer: Private Health Insurance - Indemnity | Admitting: Family Medicine

## 2012-10-26 ENCOUNTER — Encounter: Payer: Self-pay | Admitting: Family Medicine

## 2012-10-26 VITALS — BP 122/84 | HR 93 | Ht 60.0 in | Wt 168.0 lb

## 2012-10-26 DIAGNOSIS — R102 Pelvic and perineal pain: Secondary | ICD-10-CM | POA: Insufficient documentation

## 2012-10-26 DIAGNOSIS — N949 Unspecified condition associated with female genital organs and menstrual cycle: Secondary | ICD-10-CM

## 2012-10-26 NOTE — Progress Notes (Signed)
  Subjective:    Patient ID: [name], female    DOB: Jun 02, 1976, 37 y.o.   MRN: [id number]  HPI  1 mo. H/o crampy, midline, lower abdominal pain.  No change with cycle.  LMP 09/27/12, WNL.  Has not taken anything for pain.  Notes no relieving or exacerbating circumstances.  Has h/o fibroid uterus.  No inter-menstrual bleeding.   Review of Systems  Constitutional: Negative for fever.  HENT: Negative for congestion.   Respiratory: Negative for shortness of breath.   Cardiovascular: Negative for chest pain.  Gastrointestinal: Negative for abdominal pain.  Genitourinary: Positive for urgency.  Musculoskeletal: Negative for joint swelling.       Objective:   Physical Exam  Vitals reviewed. Constitutional: She appears well-developed and well-nourished. No distress.  HENT:  Head: Normocephalic and atraumatic.  Eyes: No scleral icterus.  Neck: Neck supple.  Cardiovascular: Normal rate.   Pulmonary/Chest: Effort normal.  Abdominal: Soft. There is no tenderness.  Genitourinary:  NEFG, BUS is normal, vagina is pink and ruggated.  Uterus is firm, enlarged, non-tender, approx. 14 wk size.  No adnexal mass or tenderness.          Assessment & Plan:

## 2012-10-26 NOTE — Patient Instructions (Addendum)
Pelvic Pain Pelvic pain is pain below the belly button and located between your hips. Acute pain may last a few hours or days. Chronic pelvic pain may last weeks and months. The cause may be different for different types of pain. The pain may be dull or sharp, mild or severe and can interfere with your daily activities. Write down and tell your caregiver:   Exactly where the pain is located.  If it comes and goes or is there all the time.  When it happens (with sex, urination, bowel movement, etc.)  If the pain is related to your menstrual period or stress. Your caregiver will take a full history and do a complete physical exam and Pap test. CAUSES   Painful menstrual periods (dysmenorrhea).  Normal ovulation (Mittelschmertz) that occurs in the middle of the menstrual cycle every month.  The pelvic organs get engorged with blood just before the menstrual period (pelvic congestive syndrome).  Scar tissue from an infection or past surgery (pelvic adhesions).  Cancer of the female pelvic organs. When there is pain with cancer, it has been there for a long time.  The lining of the uterus (endometrium) abnormally grows in places like the pelvis and on the pelvic organs (endometriosis).  A form of endometriosis with the lining of the uterus present inside of the muscle tissue of the uterus (adenomyosis).  Fibroid tumor (noncancerous) in the uterus.  Bladder problems such as infection, bladder spasms of the muscle tissue of the bladder.  Intestinal problems (irritable bowel syndrome, colitis, an ulcer or gastrointestinal infection).  Polyps of the cervix or uterus.  Pregnancy in the tube (ectopic pregnancy).  The opening of the cervix is too small for the menstrual blood to flow through it (cervical stenosis).  Physical or sexual abuse (past or present).  Musculo-skeletal problems from poor posture, problems with the vertebrae of the lower back or the uterine pelvic muscles falling  (prolapse).  Psychological problems such as depression or stress.  IUD (intrauterine device) in the uterus. DIAGNOSIS  Tests to make a diagnosis depends on the type, location, severity and what causes the pain to occur. Tests that may be needed include:  Blood tests.  Urine tests  Ultrasound.  X-rays.  CT Scan.  MRI.  Laparoscopy.  Major surgery. TREATMENT  Treatment will depend on the cause of the pain, which includes:  Prescription or over-the-counter pain medication.  Antibiotics.  Birth control pills.  Hormone treatment.  Nerve blocking injections.  Physical therapy.  Antidepressants.  Counseling with a psychiatrist or psychologist.  Minor or major surgery. HOME CARE INSTRUCTIONS   Only take over-the-counter or prescription medicines for pain, discomfort or fever as directed by your caregiver.  Follow your caregiver's advice to treat your pain.  Rest.  Avoid sexual intercourse if it causes the pain.  Apply warm or cold compresses (which ever works best) to the pain area.  Do relaxation exercises such as yoga or meditation.  Try acupuncture.  Avoid stressful situations.  Try group therapy.  If the pain is because of a stomach/intestinal upset, drink clear liquids, eat a bland light food diet until the symptoms go away. SEEK MEDICAL CARE IF:   You need stronger prescription pain medication.  You develop pain with sexual intercourse.  You have pain with urination.  You develop a temperature of 102 F (38.9 C) with the pain.  You are still in pain after 4 hours of taking prescription medication for the pain.  You need depression medication.    Your IUD is causing pain and you want it removed. SEEK IMMEDIATE MEDICAL CARE IF:  You develop very severe pain or tenderness.  You faint, have chills, severe weakness or dehydration.  You develop heavy vaginal bleeding or passing solid tissue.  You develop a temperature of 102 F (38.9 C)  with the pain.  You have blood in the urine.  You are being physically or sexually abused.  You have uncontrolled vomiting and diarrhea.  You are depressed and afraid of harming yourself or someone else. Document Released: 07/31/2004 Document Revised: 09/15/2011 Document Reviewed: 04/27/2008 [facility] Patient Information 2013 [place], [place]. Fibroids Fibroids are lumps (tumors) that can occur any place in a woman's body. These lumps are not cancerous. Fibroids vary in size, weight, and where they grow. HOME CARE  Do not take aspirin.  Write down the number of pads or tampons you use during your period. Tell your doctor. This can help determine the best treatment for you. GET HELP RIGHT AWAY IF:  You have pain in your lower belly (abdomen) that is not helped with medicine.  You have cramps that are not helped with medicine.  You have more bleeding between or during your period.  You feel lightheaded or pass out (faint).  Your lower belly pain gets worse. MAKE SURE YOU:  Understand these instructions.  Will watch your condition.  Will get help right away if you are not doing well or get worse. Document Released: 07/26/2010 Document Revised: 09/15/2011 Document Reviewed: 07/26/2010 [facility] Patient Information 2013 [place], [place].

## 2012-10-26 NOTE — Progress Notes (Signed)
Pelvic pain x one month, no vaginal discharge.

## 2012-10-26 NOTE — Assessment & Plan Note (Signed)
?   Enlarging fibroids vs. Degeneration.  Will schedule pelvic sono.

## 2012-10-28 LAB — URINE CULTURE: Colony Count: 15000

## 2012-11-04 ENCOUNTER — Ambulatory Visit (HOSPITAL_COMMUNITY): Payer: Private Health Insurance - Indemnity

## 2012-11-08 ENCOUNTER — Other Ambulatory Visit: Payer: Self-pay | Admitting: Family Medicine

## 2012-11-08 ENCOUNTER — Ambulatory Visit (HOSPITAL_COMMUNITY)
Admission: RE | Admit: 2012-11-08 | Discharge: 2012-11-08 | Disposition: A | Discharge status: Home / Self Care | Payer: Private Health Insurance - Indemnity | Source: Ambulatory Visit | Attending: Family Medicine | Admitting: Family Medicine

## 2012-11-08 DIAGNOSIS — R102 Pelvic and perineal pain: Secondary | ICD-10-CM

## 2012-11-08 DIAGNOSIS — N949 Unspecified condition associated with female genital organs and menstrual cycle: Secondary | ICD-10-CM | POA: Insufficient documentation

## 2012-11-08 DIAGNOSIS — N854 Malposition of uterus: Secondary | ICD-10-CM | POA: Insufficient documentation

## 2013-05-12 ENCOUNTER — Other Ambulatory Visit: Payer: Self-pay

## 2013-06-23 ENCOUNTER — Ambulatory Visit: Payer: Self-pay | Admitting: Unknown Physician Specialty

## 2014-02-14 ENCOUNTER — Ambulatory Visit (INDEPENDENT_AMBULATORY_CARE_PROVIDER_SITE_OTHER): Payer: Private Health Insurance - Indemnity | Admitting: Family Medicine

## 2014-02-14 ENCOUNTER — Encounter: Payer: Self-pay | Admitting: Family Medicine

## 2014-02-14 VITALS — BP 124/74 | HR 88 | Ht 60.0 in | Wt 164.2 lb

## 2014-02-14 DIAGNOSIS — N898 Other specified noninflammatory disorders of vagina: Secondary | ICD-10-CM

## 2014-02-14 MED ORDER — METRONIDAZOLE 500 MG PO TABS: 500.0000 mg | ORAL_TABLET | Freq: Two times a day (BID) | ORAL | Status: DC

## 2014-02-14 NOTE — Patient Instructions (Signed)

## 2014-02-14 NOTE — Progress Notes (Signed)
Patient is having increased vaginal irritation and yellowish discharge for one month.

## 2014-02-14 NOTE — Progress Notes (Addendum)
    Subjective:    Patient ID: [name] is a 38 y.o. female presenting with Vaginal Discharge  on 02/14/2014  HPI: Vaginal discharge with odor x 1 month.  Has douched. No irritation.  No new sex partners. Married. He has no symptoms.  Has not tried any treatment other than douching.  Review of Systems  Constitutional: Negative for fever and chills.  Gastrointestinal: Negative for nausea, vomiting and abdominal pain.  Genitourinary: Negative for menstrual problem and pelvic pain.      Objective:    BP 124/74  Pulse 88  Ht 5' (1.524 m)  Wt 164 lb 3.2 oz (74.481 kg)  BMI 32.07 kg/m2  LMP 01/24/2014 Physical Exam  Constitutional: She appears well-developed and well-nourished.  Eyes: No scleral icterus.  Neck: Neck supple.  Cardiovascular: Normal rate.   Pulmonary/Chest: Effort normal.  Abdominal: Soft.  Genitourinary: Uterus is enlarged. Uterus is not tender. Cervix exhibits no motion tenderness. Vaginal discharge (white) found.  Musculoskeletal: She exhibits no edema.        Assessment & Plan:   Vaginal discharge - Plan: Wet prep, genital, metroNIDAZOLE (FLAGYL) 500 MG tablet   Return if symptoms worsen or fail to improve.

## 2014-02-15 LAB — WET PREP, GENITAL
Clue Cells Wet Prep HPF POC: NONE SEEN
Trich, Wet Prep: NONE SEEN

## 2014-03-21 ENCOUNTER — Encounter: Payer: Private Health Insurance - Indemnity | Admitting: Family Medicine

## 2014-03-21 ENCOUNTER — Encounter: Payer: Self-pay | Admitting: Family Medicine

## 2014-03-21 NOTE — Progress Notes (Signed)
This encounter was created in error - please disregard.

## 2014-04-04 ENCOUNTER — Ambulatory Visit (INDEPENDENT_AMBULATORY_CARE_PROVIDER_SITE_OTHER): Payer: Private Health Insurance - Indemnity | Admitting: Family Medicine

## 2014-04-04 ENCOUNTER — Encounter: Payer: Self-pay | Admitting: Family Medicine

## 2014-04-04 VITALS — BP 127/87 | HR 79 | Ht 60.0 in | Wt 165.0 lb

## 2014-04-04 DIAGNOSIS — N898 Other specified noninflammatory disorders of vagina: Secondary | ICD-10-CM

## 2014-04-04 DIAGNOSIS — Z23 Encounter for immunization: Secondary | ICD-10-CM

## 2014-04-04 DIAGNOSIS — Z124 Encounter for screening for malignant neoplasm of cervix: Secondary | ICD-10-CM

## 2014-04-04 DIAGNOSIS — Z01419 Encounter for gynecological examination (general) (routine) without abnormal findings: Secondary | ICD-10-CM

## 2014-04-04 NOTE — Patient Instructions (Signed)
Preventive Care for Adults A healthy lifestyle and preventive care can promote health and wellness. Preventive health guidelines for women include the following key practices.  A routine yearly physical is a good way to check with your health care provider about your health and preventive screening. It is a chance to share any concerns and updates on your health and to receive a thorough exam.  Visit your dentist for a routine exam and preventive care every 6 months. Brush your teeth twice a day and floss once a day. Good oral hygiene prevents tooth decay and gum disease.  The frequency of eye exams is based on your age, health, family medical history, use of contact lenses, and other factors. Follow your health care provider's recommendations for frequency of eye exams.  Eat a healthy diet. Foods like vegetables, fruits, whole grains, low-fat dairy products, and lean protein foods contain the nutrients you need without too many calories. Decrease your intake of foods high in solid fats, added sugars, and salt. Eat the right amount of calories for you.Get information about a proper diet from your health care provider, if necessary.  Regular physical exercise is one of the most important things you can do for your health. Most adults should get at least 150 minutes of moderate-intensity exercise (any activity that increases your heart rate and causes you to sweat) each week. In addition, most adults need muscle-strengthening exercises on 2 or more days a week.  Maintain a healthy weight. The body mass index (BMI) is a screening tool to identify possible weight problems. It provides an estimate of body fat based on height and weight. Your health care provider can find your BMI and can help you achieve or maintain a healthy weight.For adults 20 years and older:  A BMI below 18.5 is considered underweight.  A BMI of 18.5 to 24.9 is normal.  A BMI of 25 to 29.9 is considered overweight.  A BMI of  30 and above is considered obese.  Maintain normal blood lipids and cholesterol levels by exercising and minimizing your intake of saturated fat. Eat a balanced diet with plenty of fruit and vegetables. Blood tests for lipids and cholesterol should begin at age 76 and be repeated every 5 years. If your lipid or cholesterol levels are high, you are over 50, or you are at high risk for heart disease, you may need your cholesterol levels checked more frequently.Ongoing high lipid and cholesterol levels should be treated with medicines if diet and exercise are not working.  If you smoke, find out from your health care provider how to quit. If you do not use tobacco, do not start.  Lung cancer screening is recommended for adults aged 22-80 years who are at high risk for developing lung cancer because of a history of smoking. A yearly low-dose CT scan of the lungs is recommended for people who have at least a 30-pack-year history of smoking and are a current smoker or have quit within the past 15 years. A pack year of smoking is smoking an average of 1 pack of cigarettes a day for 1 year (for example: 1 pack a day for 30 years or 2 packs a day for 15 years). Yearly screening should continue until the smoker has stopped smoking for at least 15 years. Yearly screening should be stopped for people who develop a health problem that would prevent them from having lung cancer treatment.  If you are pregnant, do not drink alcohol. If you are breastfeeding,  be very cautious about drinking alcohol. If you are not pregnant and choose to drink alcohol, do not have more than 1 drink per day. One drink is considered to be 12 ounces (355 mL) of beer, 5 ounces (148 mL) of wine, or 1.5 ounces (44 mL) of liquor.  Avoid use of street drugs. Do not share needles with anyone. Ask for help if you need support or instructions about stopping the use of drugs.  High blood pressure causes heart disease and increases the risk of  stroke. Your blood pressure should be checked at least every 1 to 2 years. Ongoing high blood pressure should be treated with medicines if weight loss and exercise do not work.  If you are 75-52 years old, ask your health care provider if you should take aspirin to prevent strokes.  Diabetes screening involves taking a blood sample to check your fasting blood sugar level. This should be done once every 3 years, after age 15, if you are within normal weight and without risk factors for diabetes. Testing should be considered at a younger age or be carried out more frequently if you are overweight and have at least 1 risk factor for diabetes.  Breast cancer screening is essential preventive care for women. You should practice "breast self-awareness." This means understanding the normal appearance and feel of your breasts and may include breast self-examination. Any changes detected, no matter how small, should be reported to a health care provider. Women in their 58s and 30s should have a clinical breast exam (CBE) by a health care provider as part of a regular health exam every 1 to 3 years. After age 16, women should have a CBE every year. Starting at age 53, women should consider having a mammogram (breast X-Bouillon test) every year. Women who have a family history of breast cancer should talk to their health care provider about genetic screening. Women at a high risk of breast cancer should talk to their health care providers about having an MRI and a mammogram every year.  Breast cancer gene (BRCA)-related cancer risk assessment is recommended for women who have family members with BRCA-related cancers. BRCA-related cancers include breast, ovarian, tubal, and peritoneal cancers. Having family members with these cancers may be associated with an increased risk for harmful changes (mutations) in the breast cancer genes BRCA1 and BRCA2. Results of the assessment will determine the need for genetic counseling and  BRCA1 and BRCA2 testing.  Routine pelvic exams to screen for cancer are no longer recommended for nonpregnant women who are considered low risk for cancer of the pelvic organs (ovaries, uterus, and vagina) and who do not have symptoms. Ask your health care provider if a screening pelvic exam is right for you.  If you have had past treatment for cervical cancer or a condition that could lead to cancer, you need Pap tests and screening for cancer for at least 20 years after your treatment. If Pap tests have been discontinued, your risk factors (such as having a new sexual partner) need to be reassessed to determine if screening should be resumed. Some women have medical problems that increase the chance of getting cervical cancer. In these cases, your health care provider may recommend more frequent screening and Pap tests.  The HPV test is an additional test that may be used for cervical cancer screening. The HPV test looks for the virus that can cause the cell changes on the cervix. The cells collected during the Pap test can be  tested for HPV. The HPV test could be used to screen women aged 30 years and older, and should be used in women of any age who have unclear Pap test results. After the age of 30, women should have HPV testing at the same frequency as a Pap test.  Colorectal cancer can be detected and often prevented. Most routine colorectal cancer screening begins at the age of 50 years and continues through age 75 years. However, your health care provider may recommend screening at an earlier age if you have risk factors for colon cancer. On a yearly basis, your health care provider may provide home test kits to check for hidden blood in the stool. Use of a small camera at the end of a tube, to directly examine the colon (sigmoidoscopy or colonoscopy), can detect the earliest forms of colorectal cancer. Talk to your health care provider about this at age 50, when routine screening begins. Direct  exam of the colon should be repeated every 5-10 years through age 75 years, unless early forms of pre-cancerous polyps or small growths are found.  People who are at an increased risk for hepatitis B should be screened for this virus. You are considered at high risk for hepatitis B if:  You were born in a country where hepatitis B occurs often. Talk with your health care provider about which countries are considered high risk.  Your parents were born in a high-risk country and you have not received a shot to protect against hepatitis B (hepatitis B vaccine).  You have HIV or AIDS.  You use needles to inject street drugs.  You live with, or have sex with, someone who has hepatitis B.  You get hemodialysis treatment.  You take certain medicines for conditions like cancer, organ transplantation, and autoimmune conditions.  Hepatitis C blood testing is recommended for all people born from 1945 through 1965 and any individual with known risks for hepatitis C.  Practice safe sex. Use condoms and avoid high-risk sexual practices to reduce the spread of sexually transmitted infections (STIs). STIs include gonorrhea, chlamydia, syphilis, trichomonas, herpes, HPV, and human immunodeficiency virus (HIV). Herpes, HIV, and HPV are viral illnesses that have no cure. They can result in disability, cancer, and death.  You should be screened for sexually transmitted illnesses (STIs) including gonorrhea and chlamydia if:  You are sexually active and are younger than 24 years.  You are older than 24 years and your health care provider tells you that you are at risk for this type of infection.  Your sexual activity has changed since you were last screened and you are at an increased risk for chlamydia or gonorrhea. Ask your health care provider if you are at risk.  If you are at risk of being infected with HIV, it is recommended that you take a prescription medicine daily to prevent HIV infection. This is  called preexposure prophylaxis (PrEP). You are considered at risk if:  You are a heterosexual woman, are sexually active, and are at increased risk for HIV infection.  You take drugs by injection.  You are sexually active with a partner who has HIV.  Talk with your health care provider about whether you are at high risk of being infected with HIV. If you choose to begin PrEP, you should first be tested for HIV. You should then be tested every 3 months for as long as you are taking PrEP.  Osteoporosis is a disease in which the bones lose minerals and strength   with aging. This can result in serious bone fractures or breaks. The risk of osteoporosis can be identified using a bone density scan. Women ages 66 years and over and women at risk for fractures or osteoporosis should discuss screening with their health care providers. Ask your health care provider whether you should take a calcium supplement or vitamin D to reduce the rate of osteoporosis.  Menopause can be associated with physical symptoms and risks. Hormone replacement therapy is available to decrease symptoms and risks. You should talk to your health care provider about whether hormone replacement therapy is right for you.  Use sunscreen. Apply sunscreen liberally and repeatedly throughout the day. You should seek shade when your shadow is shorter than you. Protect yourself by wearing long sleeves, pants, a wide-brimmed hat, and sunglasses year round, whenever you are outdoors.  Once a month, do a whole body skin exam, using a mirror to look at the skin on your back. Tell your health care provider of new moles, moles that have irregular borders, moles that are larger than a pencil eraser, or moles that have changed in shape or color.  Stay current with required vaccines (immunizations).  Influenza vaccine. All adults should be immunized every year.  Tetanus, diphtheria, and acellular pertussis (Td, Tdap) vaccine. Pregnant women should  receive 1 dose of Tdap vaccine during each pregnancy. The dose should be obtained regardless of the length of time since the last dose. Immunization is preferred during the 27th-36th week of gestation. An adult who has not previously received Tdap or who does not know her vaccine status should receive 1 dose of Tdap. This initial dose should be followed by tetanus and diphtheria toxoids (Td) booster doses every 10 years. Adults with an unknown or incomplete history of completing a 3-dose immunization series with Td-containing vaccines should begin or complete a primary immunization series including a Tdap dose. Adults should receive a Td booster every 10 years.  Varicella vaccine. An adult without evidence of immunity to varicella should receive 2 doses or a second dose if she has previously received 1 dose. Pregnant females who do not have evidence of immunity should receive the first dose after pregnancy. This first dose should be obtained before leaving the health care facility. The second dose should be obtained 4-8 weeks after the first dose.  Human papillomavirus (HPV) vaccine. Females aged 13-26 years who have not received the vaccine previously should obtain the 3-dose series. The vaccine is not recommended for use in pregnant females. However, pregnancy testing is not needed before receiving a dose. If a female is found to be pregnant after receiving a dose, no treatment is needed. In that case, the remaining doses should be delayed until after the pregnancy. Immunization is recommended for any person with an immunocompromised condition through the age of 47 years if she did not get any or all doses earlier. During the 3-dose series, the second dose should be obtained 4-8 weeks after the first dose. The third dose should be obtained 24 weeks after the first dose and 16 weeks after the second dose.  Zoster vaccine. One dose is recommended for adults aged 25 years or older unless certain conditions are  present.  Measles, mumps, and rubella (MMR) vaccine. Adults born before 42 generally are considered immune to measles and mumps. Adults born in [birth year] or later should have 1 or more doses of MMR vaccine unless there is a contraindication to the vaccine or there is laboratory evidence of immunity to  each of the three diseases. A routine second dose of MMR vaccine should be obtained at least 28 days after the first dose for students attending postsecondary schools, health care workers, or international travelers. People who received inactivated measles vaccine or an unknown type of measles vaccine during 1963-1967 should receive 2 doses of MMR vaccine. People who received inactivated mumps vaccine or an unknown type of mumps vaccine before 1979 and are at high risk for mumps infection should consider immunization with 2 doses of MMR vaccine. For females of childbearing age, rubella immunity should be determined. If there is no evidence of immunity, females who are not pregnant should be vaccinated. If there is no evidence of immunity, females who are pregnant should delay immunization until after pregnancy. Unvaccinated health care workers born before 1957 who lack laboratory evidence of measles, mumps, or rubella immunity or laboratory confirmation of disease should consider measles and mumps immunization with 2 doses of MMR vaccine or rubella immunization with 1 dose of MMR vaccine.  Pneumococcal 13-valent conjugate (PCV13) vaccine. When indicated, a person who is uncertain of her immunization history and has no record of immunization should receive the PCV13 vaccine. An adult aged 19 years or older who has certain medical conditions and has not been previously immunized should receive 1 dose of PCV13 vaccine. This PCV13 should be followed with a dose of pneumococcal polysaccharide (PPSV23) vaccine. The PPSV23 vaccine dose should be obtained at least 8 weeks after the dose of PCV13 vaccine. An adult aged 19  years or older who has certain medical conditions and previously received 1 or more doses of PPSV23 vaccine should receive 1 dose of PCV13. The PCV13 vaccine dose should be obtained 1 or more years after the last PPSV23 vaccine dose.  Pneumococcal polysaccharide (PPSV23) vaccine. When PCV13 is also indicated, PCV13 should be obtained first. All adults aged 65 years and older should be immunized. An adult younger than age 65 years who has certain medical conditions should be immunized. Any person who resides in a nursing home or long-term care facility should be immunized. An adult smoker should be immunized. People with an immunocompromised condition and certain other conditions should receive both PCV13 and PPSV23 vaccines. People with human immunodeficiency virus (HIV) infection should be immunized as soon as possible after diagnosis. Immunization during chemotherapy or radiation therapy should be avoided. Routine use of PPSV23 vaccine is not recommended for American Indians, [place] Natives, or people younger than 65 years unless there are medical conditions that require PPSV23 vaccine. When indicated, people who have unknown immunization and have no record of immunization should receive PPSV23 vaccine. One-time revaccination 5 years after the first dose of PPSV23 is recommended for people aged 19-64 years who have chronic kidney failure, nephrotic syndrome, asplenia, or immunocompromised conditions. People who received 1-2 doses of PPSV23 before age 65 years should receive another dose of PPSV23 vaccine at age 65 years or later if at least 5 years have passed since the previous dose. Doses of PPSV23 are not needed for people immunized with PPSV23 at or after age 65 years.  Meningococcal vaccine. Adults with asplenia or persistent complement component deficiencies should receive 2 doses of quadrivalent meningococcal conjugate (MenACWY-D) vaccine. The doses should be obtained at least 2 months apart.  Microbiologists working with certain meningococcal bacteria, military recruits, people at risk during an outbreak, and people who travel to or live in countries with a high rate of meningitis should be immunized. A first-year college student up through age   21 years who is living in a residence hall should receive a dose if she did not receive a dose on or after her 16th birthday. Adults who have certain high-risk conditions should receive one or more doses of vaccine.  Hepatitis A vaccine. Adults who wish to be protected from this disease, have certain high-risk conditions, work with hepatitis A-infected animals, work in hepatitis A research labs, or travel to or work in countries with a high rate of hepatitis A should be immunized. Adults who were previously unvaccinated and who anticipate close contact with an international adoptee during the first 60 days after arrival in the [place] from a country with a high rate of hepatitis A should be immunized.  Hepatitis B vaccine. Adults who wish to be protected from this disease, have certain high-risk conditions, may be exposed to blood or other infectious body fluids, are household contacts or sex partners of hepatitis B positive people, are clients or workers in certain care facilities, or travel to or work in countries with a high rate of hepatitis B should be immunized.  Haemophilus influenzae type b (Hib) vaccine. A previously unvaccinated person with asplenia or sickle cell disease or having a scheduled splenectomy should receive 1 dose of Hib vaccine. Regardless of previous immunization, a recipient of a hematopoietic stem cell transplant should receive a 3-dose series 6-12 months after her successful transplant. Hib vaccine is not recommended for adults with HIV infection. Preventive Services / Frequency Ages 38 to 34 years  Blood pressure check.** / Every 1 to 2 years.  Lipid and cholesterol check.** / Every 5 years beginning at age  61.  Clinical breast exam.** / Every 3 years for women in their 75s and 70s.  BRCA-related cancer risk assessment.** / For women who have family members with a BRCA-related cancer (breast, ovarian, tubal, or peritoneal cancers).  Pap test.** / Every 2 years from ages 46 through 43. Every 3 years starting at age 71 through age 44 or 48 with a history of 3 consecutive normal Pap tests.  HPV screening.** / Every 3 years from ages 4 through ages 91 to 57 with a history of 3 consecutive normal Pap tests.  Hepatitis C blood test.** / For any individual with known risks for hepatitis C.  Skin self-exam. / Monthly.  Influenza vaccine. / Every year.  Tetanus, diphtheria, and acellular pertussis (Tdap, Td) vaccine.** / Consult your health care provider. Pregnant women should receive 1 dose of Tdap vaccine during each pregnancy. 1 dose of Td every 10 years.  Varicella vaccine.** / Consult your health care provider. Pregnant females who do not have evidence of immunity should receive the first dose after pregnancy.  HPV vaccine. / 3 doses over 6 months, if 4 and younger. The vaccine is not recommended for use in pregnant females. However, pregnancy testing is not needed before receiving a dose.  Measles, mumps, rubella (MMR) vaccine.** / You need at least 1 dose of MMR if you were born in 1957 or later. You may also need a 2nd dose. For females of childbearing age, rubella immunity should be determined. If there is no evidence of immunity, females who are not pregnant should be vaccinated. If there is no evidence of immunity, females who are pregnant should delay immunization until after pregnancy.  Pneumococcal 13-valent conjugate (PCV13) vaccine.** / Consult your health care provider.  Pneumococcal polysaccharide (PPSV23) vaccine.** / 1 to 2 doses if you smoke cigarettes or if you have certain conditions.  Meningococcal vaccine.** /  1 dose if you are age 19 to 21 years and a first-year college  student living in a residence hall, or have one of several medical conditions, you need to get vaccinated against meningococcal disease. You may also need additional booster doses.  Hepatitis A vaccine.** / Consult your health care provider.  Hepatitis B vaccine.** / Consult your health care provider.  Haemophilus influenzae type b (Hib) vaccine.** / Consult your health care provider. Ages 40 to 64 years  Blood pressure check.** / Every 1 to 2 years.  Lipid and cholesterol check.** / Every 5 years beginning at age 20 years.  Lung cancer screening. / Every year if you are aged 55-80 years and have a 30-pack-year history of smoking and currently smoke or have quit within the past 15 years. Yearly screening is stopped once you have quit smoking for at least 15 years or develop a health problem that would prevent you from having lung cancer treatment.  Clinical breast exam.** / Every year after age 40 years.  BRCA-related cancer risk assessment.** / For women who have family members with a BRCA-related cancer (breast, ovarian, tubal, or peritoneal cancers).  Mammogram.** / Every year beginning at age 40 years and continuing for as long as you are in good health. Consult with your health care provider.  Pap test.** / Every 3 years starting at age 30 years through age 65 or 70 years with a history of 3 consecutive normal Pap tests.  HPV screening.** / Every 3 years from ages 30 years through ages 65 to 70 years with a history of 3 consecutive normal Pap tests.  Fecal occult blood test (FOBT) of stool. / Every year beginning at age 50 years and continuing until age 75 years. You may not need to do this test if you get a colonoscopy every 10 years.  Flexible sigmoidoscopy or colonoscopy.** / Every 5 years for a flexible sigmoidoscopy or every 10 years for a colonoscopy beginning at age 50 years and continuing until age 75 years.  Hepatitis C blood test.** / For all people born from 1945 through  1965 and any individual with known risks for hepatitis C.  Skin self-exam. / Monthly.  Influenza vaccine. / Every year.  Tetanus, diphtheria, and acellular pertussis (Tdap/Td) vaccine.** / Consult your health care provider. Pregnant women should receive 1 dose of Tdap vaccine during each pregnancy. 1 dose of Td every 10 years.  Varicella vaccine.** / Consult your health care provider. Pregnant females who do not have evidence of immunity should receive the first dose after pregnancy.  Zoster vaccine.** / 1 dose for adults aged 60 years or older.  Measles, mumps, rubella (MMR) vaccine.** / You need at least 1 dose of MMR if you were born in 1957 or later. You may also need a 2nd dose. For females of childbearing age, rubella immunity should be determined. If there is no evidence of immunity, females who are not pregnant should be vaccinated. If there is no evidence of immunity, females who are pregnant should delay immunization until after pregnancy.  Pneumococcal 13-valent conjugate (PCV13) vaccine.** / Consult your health care provider.  Pneumococcal polysaccharide (PPSV23) vaccine.** / 1 to 2 doses if you smoke cigarettes or if you have certain conditions.  Meningococcal vaccine.** / Consult your health care provider.  Hepatitis A vaccine.** / Consult your health care provider.  Hepatitis B vaccine.** / Consult your health care provider.  Haemophilus influenzae type b (Hib) vaccine.** / Consult your health care provider. Ages 65   years and over  Blood pressure check.** / Every 1 to 2 years.  Lipid and cholesterol check.** / Every 5 years beginning at age 93 years.  Lung cancer screening. / Every year if you are aged 34-80 years and have a 30-pack-year history of smoking and currently smoke or have quit within the past 15 years. Yearly screening is stopped once you have quit smoking for at least 15 years or develop a health problem that would prevent you from having lung cancer  treatment.  Clinical breast exam.** / Every year after age 31 years.  BRCA-related cancer risk assessment.** / For women who have family members with a BRCA-related cancer (breast, ovarian, tubal, or peritoneal cancers).  Mammogram.** / Every year beginning at age 26 years and continuing for as long as you are in good health. Consult with your health care provider.  Pap test.** / Every 3 years starting at age 59 years through age 36 or 68 years with 3 consecutive normal Pap tests. Testing can be stopped between 65 and 70 years with 3 consecutive normal Pap tests and no abnormal Pap or HPV tests in the past 10 years.  HPV screening.** / Every 3 years from ages 38 years through ages 78 or 6 years with a history of 3 consecutive normal Pap tests. Testing can be stopped between 65 and 70 years with 3 consecutive normal Pap tests and no abnormal Pap or HPV tests in the past 10 years.  Fecal occult blood test (FOBT) of stool. / Every year beginning at age 53 years and continuing until age 46 years. You may not need to do this test if you get a colonoscopy every 10 years.  Flexible sigmoidoscopy or colonoscopy.** / Every 5 years for a flexible sigmoidoscopy or every 10 years for a colonoscopy beginning at age 27 years and continuing until age 90 years.  Hepatitis C blood test.** / For all people born from [birth year] through 1967 and any individual with known risks for hepatitis C.  Osteoporosis screening.** / A one-time screening for women ages 72 years and over and women at risk for fractures or osteoporosis.  Skin self-exam. / Monthly.  Influenza vaccine. / Every year.  Tetanus, diphtheria, and acellular pertussis (Tdap/Td) vaccine.** / 1 dose of Td every 10 years.  Varicella vaccine.** / Consult your health care provider.  Zoster vaccine.** / 1 dose for adults aged 65 years or older.  Pneumococcal 13-valent conjugate (PCV13) vaccine.** / Consult your health care provider.  Pneumococcal  polysaccharide (PPSV23) vaccine.** / 1 dose for all adults aged 46 years and older.  Meningococcal vaccine.** / Consult your health care provider.  Hepatitis A vaccine.** / Consult your health care provider.  Hepatitis B vaccine.** / Consult your health care provider.  Haemophilus influenzae type b (Hib) vaccine.** / Consult your health care provider. ** Family history and personal history of risk and conditions may change your health care provider's recommendations. Document Released: 08/19/2001 Document Revised: 11/07/2013 Document Reviewed: 11/18/2010 [facility] Patient Information 2015 [place], [place]. This information is not intended to replace advice given to you by your health care provider. Make sure you discuss any questions you have with your health care provider.

## 2014-04-04 NOTE — Progress Notes (Signed)
  Subjective:     [name] is a 38 y.o. female and is here for a comprehensive physical exam. The patient reports problems - vaginal irritation. Previously treated with flagyl but did not complete course secondary to . Blood work through her work. Desires flu shot.  History   Social History  . Marital Status: Married    Spouse Name: N/A    Number of Children: N/A  . Years of Education: N/A   Occupational History  . Not on file.   Social History Main Topics  . Smoking status: Never Smoker   . Smokeless tobacco: Never Used  . Alcohol Use: No  . Drug Use: No  . Sexual Activity: Yes    Partners: Male    Birth Control/ Protection: None     Comment: vasectomy   Other Topics Concern  . Not on file   Social History Narrative  . No narrative on file   Health Maintenance  Topic Date Due  . Tetanus/tdap  03/12/1993  . Influenza Vaccine  04/06/2014 (Originally 02/04/2014)  . Pap Smear  12/29/2014    The following portions of the patient's history were reviewed and updated as appropriate: allergies, current medications, past family history, past medical history, past social history, past surgical history and problem list.  Review of Systems A comprehensive review of systems was negative.   Objective:    BP 127/87  Pulse 79  Ht 5' (1.524 m)  Wt 165 lb (74.844 kg)  BMI 32.22 kg/m2  LMP 03/20/2014 General appearance: alert, cooperative and appears stated age Neck: no adenopathy, supple, symmetrical, trachea midline and thyroid not enlarged, symmetric, no tenderness/mass/nodules Lungs: clear to auscultation bilaterally Breasts: normal appearance, no masses or tenderness Heart: regular rate and rhythm and systolic murmur: early systolic 1/6, blowing at 2nd left intercostal space Abdomen: soft, non-tender; bowel sounds normal; no masses,  no organomegaly Pelvic: cervix normal in appearance, external genitalia normal, no adnexal masses or tenderness, no cervical motion  tenderness, uterus normal size, shape, and consistency, vagina normal without discharge and uterus is retroverted Extremities: extremities normal, atraumatic, no cyanosis or edema Pulses: 2+ and symmetric Skin: Skin color, texture, turgor normal. No rashes or lesions Lymph nodes: Cervical, supraclavicular, and axillary nodes normal. Neurologic: Grossly normal    Assessment:    Healthy female exam.      Plan:      Problem List Items Addressed This Visit   None    Visit Diagnoses   Screening for malignant neoplasm of the cervix    -  Primary    Relevant Orders       Pap Lb, rfx HPV all pth    Routine gynecological examination        Relevant Orders       MM DIGITAL SCREENING BILATERAL    Vaginal discharge        Relevant Orders       Wet Prep w/ Trich Cult Reflex    Needs flu shot        Relevant Orders       Flu Vaccine QUAD 36+ mos IM (Fluarix, Quad PF)       See After Visit Summary for Counseling Recommendations

## 2014-04-06 LAB — PAP LB, RFX HPV ALL PTH: PAP Smear Comment: 0

## 2014-04-06 LAB — WET PREP W/ TRICH CULT REFLEX

## 2014-04-11 LAB — NUSWAB VAGINITIS PLUS (VG+)
Chlamydia trachomatis, NAA: NEGATIVE
Neisseria gonorrhoeae, NAA: NEGATIVE

## 2014-04-11 LAB — SPECIMEN STATUS REPORT

## 2014-05-08 ENCOUNTER — Encounter: Payer: Self-pay | Admitting: Family Medicine

## 2014-05-24 ENCOUNTER — Ambulatory Visit: Payer: Self-pay

## 2014-05-24 LAB — HM MAMMOGRAPHY: HM Mammogram: NORMAL

## 2014-10-03 ENCOUNTER — Ambulatory Visit (INDEPENDENT_AMBULATORY_CARE_PROVIDER_SITE_OTHER): Payer: Private Health Insurance - Indemnity | Admitting: Family Medicine

## 2014-10-03 ENCOUNTER — Encounter: Payer: Self-pay | Admitting: Family Medicine

## 2014-10-03 VITALS — BP 127/81 | HR 94 | Wt 168.0 lb

## 2014-10-03 DIAGNOSIS — R102 Pelvic and perineal pain: Secondary | ICD-10-CM

## 2014-10-03 DIAGNOSIS — N912 Amenorrhea, unspecified: Secondary | ICD-10-CM

## 2014-10-03 DIAGNOSIS — N915 Oligomenorrhea, unspecified: Secondary | ICD-10-CM

## 2014-10-03 DIAGNOSIS — Z113 Encounter for screening for infections with a predominantly sexual mode of transmission: Secondary | ICD-10-CM

## 2014-10-03 LAB — HM PAP SMEAR: HM Pap smear: NORMAL

## 2014-10-03 LAB — POCT URINE PREGNANCY: Preg Test, Ur: NEGATIVE

## 2014-10-03 NOTE — Progress Notes (Signed)
    Subjective:    Patient ID: [name] is a 39 y.o. female presenting with Pelvic Pain  on 10/03/2014  HPI: Reports midline crampy abdominal pain.  Pain x 1 month. Painful intercourse.  No vaginal discharge. Reports skipping months between cycles. LNMP 08/2014. Partner with vasectomy. No vaginal discharge.  Denies fever, chills. Not cyclically related, no change with change in position. Trial ibuprofen which helps but takes infrequently.  Review of Systems  Constitutional: Negative for fever and chills.  Respiratory: Negative for shortness of breath.   Cardiovascular: Negative for chest pain.  Gastrointestinal: Negative for nausea, vomiting and abdominal pain.  Genitourinary: Negative for dysuria.  Skin: Negative for rash.      Objective:    BP 127/81 mmHg  Pulse 94  Wt 168 lb (76.204 kg)  LMP 08/11/2014 Physical Exam  Constitutional: She is oriented to person, place, and time. She appears well-developed and well-nourished. No distress.  HENT:  Head: Normocephalic and atraumatic.  Eyes: No scleral icterus.  Neck: Neck supple.  Cardiovascular: Normal rate.   Pulmonary/Chest: Effort normal.  Abdominal: Soft.  Genitourinary: Uterus is not tender. Cervix exhibits no motion tenderness. Right adnexum displays no mass and no tenderness. Left adnexum displays no mass and no tenderness. No vaginal discharge found.  Neurological: She is alert and oriented to person, place, and time.  Skin: Skin is warm and dry.  Psychiatric: She has a normal mood and affect.        Assessment & Plan:   Problem List Items Addressed This Visit    None    Visit Diagnoses    Hypomenorrhea/oligomenorrhea    -  Primary    unclear etiology--will check labs    Relevant Orders    POCT urine pregnancy (Completed)    HIV antibody (with reflex)    Prolactin    TSH    Chlamydia/GC NAA, Confirmation    Pelvic pain in female        Check cultures and pelvic sono    Relevant Orders    US  Transvaginal Non-OB    HIV antibody (with reflex)    Prolactin    TSH    Chlamydia/GC NAA, Confirmation    Ultrasound pelvis complete    Screen for STD (sexually transmitted disease)        Relevant Orders    HIV antibody (with reflex)    Prolactin    TSH    Chlamydia/GC NAA, Confirmation       Return in about 3 months (around 01/03/2015).

## 2014-10-04 LAB — PROLACTIN: Prolactin: 16.9 ng/mL (ref 4.8–23.3)

## 2014-10-04 LAB — HIV ANTIBODY (ROUTINE TESTING W REFLEX): HIV Screen 4th Generation wRfx: NONREACTIVE

## 2014-10-04 LAB — TSH: TSH: 2.8 u[IU]/mL (ref 0.450–4.500)

## 2014-10-05 LAB — CHLAMYDIA/GC NAA, CONFIRMATION
Chlamydia trachomatis, NAA: NEGATIVE
Neisseria gonorrhoeae, NAA: NEGATIVE

## 2014-10-11 ENCOUNTER — Ambulatory Visit (HOSPITAL_COMMUNITY)
Admission: RE | Admit: 2014-10-11 | Discharge: 2014-10-11 | Disposition: A | Discharge status: Home / Self Care | Payer: Private Health Insurance - Indemnity | Source: Ambulatory Visit | Attending: Family Medicine | Admitting: Family Medicine

## 2014-10-11 DIAGNOSIS — R109 Unspecified abdominal pain: Secondary | ICD-10-CM | POA: Diagnosis not present

## 2014-10-11 DIAGNOSIS — R102 Pelvic and perineal pain: Secondary | ICD-10-CM

## 2014-10-11 DIAGNOSIS — N941 Dyspareunia: Secondary | ICD-10-CM | POA: Diagnosis present

## 2014-10-19 ENCOUNTER — Telehealth: Payer: Self-pay | Admitting: *Deleted

## 2014-10-19 NOTE — Telephone Encounter (Signed)
Patient called to get results of ultrasound.  Her pain has subsided for about three days now.  Informed of normal pelvic ultrasound and she will call us back for follow up if her pain returns.

## 2015-01-09 ENCOUNTER — Other Ambulatory Visit: Payer: Self-pay | Admitting: Family Medicine

## 2015-01-09 DIAGNOSIS — R5383 Other fatigue: Secondary | ICD-10-CM

## 2015-01-09 DIAGNOSIS — Z131 Encounter for screening for diabetes mellitus: Secondary | ICD-10-CM

## 2015-01-09 DIAGNOSIS — Z13220 Encounter for screening for lipoid disorders: Secondary | ICD-10-CM

## 2015-01-10 LAB — CBC WITH DIFFERENTIAL/PLATELET
Basophils Absolute: 0 10*3/uL (ref 0.0–0.2)
Basos: 0 %
EOS (ABSOLUTE): 0.1 10*3/uL (ref 0.0–0.4)
Eos: 2 %
Hematocrit: 38.1 % (ref 34.0–46.6)
Hemoglobin: 12.5 g/dL (ref 11.1–15.9)
Immature Grans (Abs): 0 10*3/uL (ref 0.0–0.1)
Immature Granulocytes: 0 %
Lymphocytes Absolute: 2.1 10*3/uL (ref 0.7–3.1)
Lymphs: 40 %
MCH: 28 pg (ref 26.6–33.0)
MCHC: 32.8 g/dL (ref 31.5–35.7)
MCV: 85 fL (ref 79–97)
Monocytes Absolute: 0.5 10*3/uL (ref 0.1–0.9)
Monocytes: 10 %
Neutrophils Absolute: 2.5 10*3/uL (ref 1.4–7.0)
Neutrophils: 48 %
Platelets: 356 10*3/uL (ref 150–379)
RBC: 4.46 x10E6/uL (ref 3.77–5.28)
RDW: 12.6 % (ref 12.3–15.4)
WBC: 5.3 10*3/uL (ref 3.4–10.8)

## 2015-01-10 LAB — VITAMIN B12: Vitamin B-12: 274 pg/mL (ref 211–946)

## 2015-01-10 LAB — VITAMIN D 25 HYDROXY (VIT D DEFICIENCY, FRACTURES): Vit D, 25-Hydroxy: 13.9 ng/mL — ABNORMAL LOW (ref 30.0–100.0)

## 2015-01-10 LAB — LIPID PANEL
Chol/HDL Ratio: 3.5 ratio units (ref 0.0–4.4)
Cholesterol, Total: 188 mg/dL (ref 100–199)
HDL: 53 mg/dL (ref >39)
LDL Calculated: 108 mg/dL — ABNORMAL HIGH (ref 0–99)
Triglycerides: 136 mg/dL (ref 0–149)
VLDL Cholesterol Cal: 27 mg/dL (ref 5–40)

## 2015-01-10 LAB — TSH: TSH: 2.72 u[IU]/mL (ref 0.450–4.500)

## 2015-01-10 LAB — HEMOGLOBIN A1C
Est. average glucose Bld gHb Est-mCnc: 108 mg/dL
Hgb A1c MFr Bld: 5.4 % (ref 4.8–5.6)

## 2015-01-11 ENCOUNTER — Encounter: Payer: Self-pay | Admitting: Family Medicine

## 2015-01-11 ENCOUNTER — Other Ambulatory Visit: Payer: Self-pay | Admitting: Family Medicine

## 2015-01-11 DIAGNOSIS — E538 Deficiency of other specified B group vitamins: Secondary | ICD-10-CM | POA: Insufficient documentation

## 2015-01-11 DIAGNOSIS — E559 Vitamin D deficiency, unspecified: Secondary | ICD-10-CM | POA: Insufficient documentation

## 2015-01-11 MED ORDER — VITAMIN D (ERGOCALCIFEROL) 1.25 MG (50000 UNIT) PO CAPS: 50000.0000 [IU] | ORAL_CAPSULE | ORAL | Status: DC

## 2015-01-16 ENCOUNTER — Ambulatory Visit: Payer: Private Health Insurance - Indemnity | Admitting: Podiatry

## 2015-01-25 ENCOUNTER — Ambulatory Visit (INDEPENDENT_AMBULATORY_CARE_PROVIDER_SITE_OTHER): Payer: Private Health Insurance - Indemnity | Admitting: Podiatry

## 2015-01-25 ENCOUNTER — Ambulatory Visit (INDEPENDENT_AMBULATORY_CARE_PROVIDER_SITE_OTHER): Payer: Private Health Insurance - Indemnity

## 2015-01-25 VITALS — BP 129/86 | HR 94 | Resp 16

## 2015-01-25 DIAGNOSIS — M722 Plantar fascial fibromatosis: Secondary | ICD-10-CM | POA: Diagnosis not present

## 2015-01-25 DIAGNOSIS — R52 Pain, unspecified: Secondary | ICD-10-CM

## 2015-01-25 DIAGNOSIS — M216X1 Other acquired deformities of right foot: Secondary | ICD-10-CM

## 2015-01-25 NOTE — Progress Notes (Signed)
Subjective:     Patient ID: [name], female   DOB: 09/17/1975, 39 y.o.   MRN: [id number]  HPI  40 year old female presents the office with concerns of painful ball of her foot on the right side for which she mustn't point submetatarsal 2. She states this been ongoing for greater than one month and seems to worsen after she went to [employer] and doing a lot of walking in June. She states the pain is intermittent in nature. Most of her pain is after prolonged ambulation and standing. She denies any history of injury or trauma. Denies any swelling or redness. S denies any numbness or tingling. The pain does not wake her up at night. he's had no prior treatment. No other complaints at this time.  Review of Systems  All other systems reviewed and are negative.      Objective:   Physical Exam AAO x3, NAD DP/PT pulses palpable bilaterally, CRT less than 3 seconds Protective sensation intact with Simms Weinstein monofilament, vibratory sensation intact, Achilles tendon reflex intact There is mild discomfort along the right foot submetatarsal 2. There is, submetatarsal heads plantarly. There is no pain along the dorsal aspect of the metatarsal. There is no pain with vibratory sensation.  No other areas of tenderness to bilateral lower extremities. MMT 5/5, ROM WNL.  No open lesions or pre-ulcerative lesions.  No overlying edema, erythema, increase in warmth to bilateral lower extremities.  No pain with calf compression, swelling, warmth, erythema bilaterally.      Assessment:     39 year old female with right foot submetatarsal to pain likely biomechanical in nature with prominence of the metatarsal head plantarly.    Plan:     -Treatment options discussed including all alternatives, risks, and complications -X-rays were obtained and reviewed with the patient. Met adductus is present. -Discussed etiology.  -Dispensed metatarsal offloading pads. -Discussed that she'll likely benefit  from orthotics. She'll consider an over-the-counter.. I discussed what to look from purchasing these. -Shoe gear modifications. -Follow-up of symptoms are not resolved in the next 3-4 weeks or sooner if any problems are to arise. In the meantime call the office with any questions, concerns, change in symptoms.  Celesta Gentile, DPM

## 2015-01-29 ENCOUNTER — Other Ambulatory Visit: Payer: Self-pay | Admitting: Unknown Physician Specialty

## 2015-01-29 DIAGNOSIS — E041 Nontoxic single thyroid nodule: Secondary | ICD-10-CM

## 2015-01-31 ENCOUNTER — Ambulatory Visit: Payer: Private Health Insurance - Indemnity

## 2015-02-13 ENCOUNTER — Telehealth: Payer: Self-pay | Admitting: Family Medicine

## 2015-02-13 ENCOUNTER — Other Ambulatory Visit (INDEPENDENT_AMBULATORY_CARE_PROVIDER_SITE_OTHER): Payer: Managed Care, Other (non HMO) | Admitting: *Deleted

## 2015-02-13 DIAGNOSIS — R309 Painful micturition, unspecified: Secondary | ICD-10-CM

## 2015-02-13 LAB — POCT URINALYSIS DIPSTICK
Bilirubin, UA: NEGATIVE
Glucose, UA: NEGATIVE
Ketones, UA: NEGATIVE
Leukocytes, UA: NEGATIVE
Nitrite, UA: NEGATIVE
Protein, UA: NEGATIVE
Spec Grav, UA: 1.01
Urobilinogen, UA: NEGATIVE
pH, UA: 6

## 2015-02-13 NOTE — Telephone Encounter (Signed)
Pt wants to know if Dr. Kennon Rounds will call her in something for a UTI. She uses the [facility] on [address] in [place].

## 2015-02-13 NOTE — Addendum Note (Signed)
Addended by: Ricka Burdock on: 02/13/2015 02:08 PM   Modules accepted: Orders

## 2015-02-13 NOTE — Progress Notes (Signed)
Patient here today to drop off a urine sample.  I will send off for culture.

## 2015-02-15 ENCOUNTER — Encounter: Payer: Self-pay | Admitting: Family Medicine

## 2015-02-15 ENCOUNTER — Ambulatory Visit (INDEPENDENT_AMBULATORY_CARE_PROVIDER_SITE_OTHER): Payer: Managed Care, Other (non HMO) | Admitting: Family Medicine

## 2015-02-15 VITALS — BP 126/68 | HR 112 | Temp 98.2°F | Resp 18 | Ht 60.0 in | Wt 166.3 lb

## 2015-02-15 DIAGNOSIS — Z23 Encounter for immunization: Secondary | ICD-10-CM | POA: Diagnosis not present

## 2015-02-15 DIAGNOSIS — I341 Nonrheumatic mitral (valve) prolapse: Secondary | ICD-10-CM | POA: Insufficient documentation

## 2015-02-15 DIAGNOSIS — R319 Hematuria, unspecified: Secondary | ICD-10-CM

## 2015-02-15 DIAGNOSIS — E538 Deficiency of other specified B group vitamins: Secondary | ICD-10-CM | POA: Diagnosis not present

## 2015-02-15 DIAGNOSIS — Z862 Personal history of diseases of the blood and blood-forming organs and certain disorders involving the immune mechanism: Secondary | ICD-10-CM | POA: Insufficient documentation

## 2015-02-15 LAB — POCT URINALYSIS DIPSTICK
Bilirubin, UA: NEGATIVE
Glucose, UA: NEGATIVE
Ketones, UA: NEGATIVE
Leukocytes, UA: NEGATIVE
Nitrite, UA: NEGATIVE
Protein, UA: NEGATIVE
Spec Grav, UA: <=1.005
Urobilinogen, UA: 0.2
pH, UA: 6

## 2015-02-15 LAB — URINE CULTURE

## 2015-02-15 MED ORDER — CYANOCOBALAMIN 1000 MCG/ML IJ SOLN
1000.0000 ug | Freq: Once | INTRAMUSCULAR | Status: AC
  Administered 2015-02-15: 1000 ug via INTRAMUSCULAR

## 2015-02-15 MED ORDER — CIPROFLOXACIN HCL 250 MG PO TABS
250.0000 mg | ORAL_TABLET | Freq: Two times a day (BID) | ORAL | Status: DC
Start: 2015-02-15 — End: 2015-03-14

## 2015-02-15 NOTE — Progress Notes (Signed)
Name: [name]   MRN: [id number]    DOB: [birth year]   Date:02/15/2015       Progress Note  Subjective  Chief Complaint  Chief Complaint  Patient presents with  . Hematuria    Onset 5 days, having no other urinary symptoms. Period stopped on February 06, 2015  . Back Pain    Lower back pain on both side, onset 5 days same time patient noticied blood in urine.     HPI   Hematuria: she states that for the past five days she has hematuria and low back pain. She states has blood when she wipes after voiding, not during the voiding episode. Denies any vulva and vaginal lesions. LMP 02/01/2015 and ended on the 2nd. She has some hesitancy, but denies urinary frequency or urgency.   B12 deficiency: on labs done in July, never started supplementation, would like an injection today.  Patient Active Problem List   Diagnosis Date Noted  . MVP (mitral valve prolapse) 02/15/2015  . History of iron deficiency anemia 02/15/2015  . Vitamin D deficiency 01/11/2015  . Vitamin B12 deficiency 01/11/2015  . Heart murmur 12/29/2011  . Fibroid uterus 12/29/2011  . Family history of breast cancer 12/29/2011    Past Surgical History  Procedure Laterality Date  . Leep    . Wisdom tooth extraction      x4    Family History  Problem Relation Age of Onset  . Cancer Mother 64    deceased at age 69 (breast)  . Diabetes Mother   . Hypertension Mother   . Cancer Maternal Aunt 32    breast  . Cancer Maternal Grandmother 70    pancreatic  . Cancer Maternal Grandfather 67    throat  . Hypertension Father   . Cancer Father 70    prostate  . Hypertension Brother   . Heart disease Paternal Grandmother     heart attack  . Heart disease Paternal Grandfather     heart attack.    Social History   Social History  . Marital Status: Married    Spouse Name: N/A  . Number of Children: N/A  . Years of Education: N/A   Occupational History  . Not on file.   Social History Main Topics  .  Smoking status: Never Smoker   . Smokeless tobacco: Never Used  . Alcohol Use: No  . Drug Use: No  . Sexual Activity:    Partners: Male    Birth Control/ Protection: Surgical     Comment: vasectomy   Other Topics Concern  . Not on file   Social History Narrative     Current outpatient prescriptions:  .  omeprazole (PRILOSEC) 20 MG capsule, Take 1 capsule by mouth daily., Disp: , Rfl:  .  Vitamin D, Ergocalciferol, (DRISDOL) 50000 UNITS CAPS capsule, Take 1 capsule (50,000 Units total) by mouth every 7 (seven) days., Disp: 12 capsule, Rfl: 0 .  ciprofloxacin (CIPRO) 250 MG tablet, Take 1 tablet (250 mg total) by mouth 2 (two) times daily., Disp: 10 tablet, Rfl: 0  No Known Allergies   ROS  Constitutional: Negative for fever or weight change.  Respiratory: Negative for cough and shortness of breath.   Cardiovascular: Negative for chest pain or palpitations.  Gastrointestinal: Negative for abdominal pain, no bowel changes.  Musculoskeletal: Negative for gait problem or joint swelling.  Skin: Negative for rash.  Neurological: Negative for dizziness or headache.  No other specific complaints in a complete review  of systems (except as listed in HPI above).  Objective  Filed Vitals:   02/15/15 1104  BP: 126/68  Pulse: 112  Temp: 98.2 F (36.8 C)  TempSrc: Oral  Resp: 18  Height: 5' (1.524 m)  Weight: 166 lb 4.8 oz (75.433 kg)  SpO2: 98%    Body mass index is 32.48 kg/(m^2).  Physical Exam  Constitutional: Patient appears well-developed and well-nourished. Obese  No distress.  HEENT: head atraumatic, normocephalic, pupils equal and reactive to light, ears neck supple, throat within normal limits Cardiovascular: Normal rate, regular rhythm and normal heart sounds.  No murmur heard. No BLE edema. Pulmonary/Chest: Effort normal and breath sounds normal. No respiratory distress. Abdominal: Soft.  There is no tenderness. Negative CVA Genitalia: patient refused  exam Psychiatric: Patient has a normal mood and affect. behavior is normal. Judgment and thought content normal.  Recent Results (from the past 2160 hour(s))  CBC with Differential/Platelet     Status: None   Collection Time: 01/09/15 10:39 AM  Result Value Ref Range   WBC 5.3 3.4 - 10.8 x10E3/uL   RBC 4.46 3.77 - 5.28 x10E6/uL   Hemoglobin 12.5 11.1 - 15.9 g/dL   Hematocrit 38.1 34.0 - 46.6 %   MCV 85 79 - 97 fL   MCH 28.0 26.6 - 33.0 pg   MCHC 32.8 31.5 - 35.7 g/dL   RDW 12.6 12.3 - 15.4 %   Platelets 356 150 - 379 x10E3/uL   Neutrophils 48 %   Lymphs 40 %   Monocytes 10 %   Eos 2 %   Basos 0 %   Neutrophils Absolute 2.5 1.4 - 7.0 x10E3/uL   Lymphocytes Absolute 2.1 0.7 - 3.1 x10E3/uL   Monocytes Absolute 0.5 0.1 - 0.9 x10E3/uL   EOS (ABSOLUTE) 0.1 0.0 - 0.4 x10E3/uL   Basophils Absolute 0.0 0.0 - 0.2 x10E3/uL   Immature Granulocytes 0 %   Immature Grans (Abs) 0.0 0.0 - 0.1 x10E3/uL  Lipid panel     Status: Abnormal   Collection Time: 01/09/15 10:39 AM  Result Value Ref Range   Cholesterol, Total 188 100 - 199 mg/dL   Triglycerides 136 0 - 149 mg/dL   HDL 53 >39 mg/dL    Comment: According to ATP-III Guidelines, HDL-C >59 mg/dL is considered a negative risk factor for CHD.    VLDL Cholesterol Cal 27 5 - 40 mg/dL   LDL Calculated 108 (H) 0 - 99 mg/dL   Chol/HDL Ratio 3.5 0.0 - 4.4 ratio units    Comment:                                   T. Chol/HDL Ratio                                             Men  Women                               1/2 Avg.Risk  3.4    3.3                                   Avg.Risk  5.0    4.4  2X Avg.Risk  9.6    7.1                                3X Avg.Risk 23.4   11.0   Hemoglobin A1c     Status: None   Collection Time: 01/09/15 10:39 AM  Result Value Ref Range   Hgb A1c MFr Bld 5.4 4.8 - 5.6 %    Comment:          Pre-diabetes: 5.7 - 6.4          Diabetes: >6.4          Glycemic control for adults with  diabetes: <7.0    Est. average glucose Bld gHb Est-mCnc 108 mg/dL  TSH     Status: None   Collection Time: 01/09/15 10:39 AM  Result Value Ref Range   TSH 2.720 0.450 - 4.500 uIU/mL  Vit D  25 hydroxy (rtn osteoporosis monitoring)     Status: Abnormal   Collection Time: 01/09/15 10:39 AM  Result Value Ref Range   Vit D, 25-Hydroxy 13.9 (L) 30.0 - 100.0 ng/mL    Comment: Vitamin D deficiency has been defined by the [place] and an Endocrine Society practice guideline as a level of serum 25-OH vitamin D less than 20 ng/mL (1,2). The Endocrine Society went on to further define vitamin D insufficiency as a level between 21 and 29 ng/mL (2). 1. IOM (Institute of Medicine). 2010. Dietary reference    intakes for calcium and D. [place]: The    [employer]. 2. Holick MF, Binkley [place], Bischoff-Ferrari HA, et al.    Evaluation, treatment, and prevention of vitamin D    deficiency: an Endocrine Society clinical practice    guideline. JCEM. 2011 Jul; 96(7):1911-30.   Vitamin B12     Status: None   Collection Time: 01/09/15 10:39 AM  Result Value Ref Range   Vitamin B-12 274 211 - 946 pg/mL  Urine Culture     Status: None   Collection Time: 02/13/15 11:16 AM  Result Value Ref Range   Urine Culture, Routine Final report    Result 1 Comment     Comment: Mixed urogenital flora 10,000-25,000 colony forming units per mL   POCT Urinalysis Dipstick     Status: Abnormal   Collection Time: 02/13/15 11:22 AM  Result Value Ref Range   Color, UA YELLOW    Clarity, UA AMBER    Glucose, UA NEGATIVE    Bilirubin, UA NEGATIVE    Ketones, UA NEGATIVE    Spec Grav, UA 1.010    Blood, UA 3+    pH, UA 6.0    Protein, UA NEGATIVE    Urobilinogen, UA negative    Nitrite, UA NEGATIVE    Leukocytes, UA Negative Negative  POCT Urinalysis Dipstick     Status: None   Collection Time: 02/15/15 11:09 AM  Result Value Ref Range   Color, UA dark yellow    Clarity, UA cloudy     Glucose, UA neg    Bilirubin, UA neg    Ketones, UA neg    Spec Grav, UA <=1.005    Blood, UA Large    pH, UA 6.0    Protein, UA neg    Urobilinogen, UA 0.2    Nitrite, UA neg    Leukocytes, UA Negative Negative     PHQ2/9: Depression screen [facility] 2/9 02/15/2015  Decreased Interest  0  Down, Depressed, Hopeless 0  PHQ - 2 Score 0    Fall Risk: Fall Risk  02/15/2015  Falls in the past year? No     Assessment & Plan  1. Hematuria It may be from an vulva lesion or urethritis , possible cystitis. Patient refused gynecological exam. We will treat with Cipro and send for culture if negative refer to Urologist - POCT Urinalysis Dipstick - Urine Culture - ciprofloxacin (CIPRO) 250 MG tablet; Take 1 tablet (250 mg total) by mouth 2 (two) times daily.  Dispense: 10 tablet; Refill: 0  2. Vitamin B12 deficiency B12 injection today after that may take otc SL 1000 mg daily or return monthly for injection  3. Needs flu shot  - Flu Vaccine QUAD 36+ mos PF IM (Fluarix Quad PF)

## 2015-02-16 ENCOUNTER — Other Ambulatory Visit: Payer: Self-pay | Admitting: Family Medicine

## 2015-02-16 DIAGNOSIS — R319 Hematuria, unspecified: Secondary | ICD-10-CM

## 2015-02-16 LAB — URINE CULTURE: Organism ID, Bacteria: NO GROWTH

## 2015-03-14 ENCOUNTER — Encounter: Payer: Self-pay | Admitting: Obstetrics and Gynecology

## 2015-03-14 ENCOUNTER — Ambulatory Visit: Payer: Managed Care, Other (non HMO)

## 2015-03-14 ENCOUNTER — Ambulatory Visit (INDEPENDENT_AMBULATORY_CARE_PROVIDER_SITE_OTHER): Payer: Managed Care, Other (non HMO) | Admitting: Obstetrics and Gynecology

## 2015-03-14 VITALS — BP 139/89 | HR 98 | Ht 60.0 in | Wt 167.3 lb

## 2015-03-14 DIAGNOSIS — R312 Other microscopic hematuria: Secondary | ICD-10-CM

## 2015-03-14 DIAGNOSIS — R3129 Other microscopic hematuria: Secondary | ICD-10-CM

## 2015-03-14 LAB — MICROSCOPIC EXAMINATION: Bacteria, UA: NONE SEEN

## 2015-03-14 LAB — URINALYSIS, COMPLETE
Bilirubin, UA: NEGATIVE
Glucose, UA: NEGATIVE
Ketones, UA: NEGATIVE
Leukocytes, UA: NEGATIVE
Nitrite, UA: NEGATIVE
Protein, UA: NEGATIVE
Specific Gravity, UA: 1.025 (ref 1.005–1.030)
Urobilinogen, Ur: 0.2 mg/dL (ref 0.2–1.0)
pH, UA: 6 (ref 5.0–7.5)

## 2015-03-14 NOTE — Patient Instructions (Signed)
Follow up in 3 months to recheck a urinalysis. If symptoms return please come to our office and drop a urine specimen for microanalysis.

## 2015-03-14 NOTE — Progress Notes (Signed)
03/14/2015 4:13 PM   [name] 1976/02/11 [id number]  Referring provider: Steele Sizer, MD [address] [place], [place] [address]  Chief Complaint  Patient presents with  . Hematuria    New Patient    HPI: Patient is a 39 year old African-American female presenting today as a referral for hematuria. Patient states that she noticed blood when she wiped after voiding approximately 1 month ago. She also noted blood dripping into the toilet after she finished voiding. The symptoms continued for about a week. She did experience some lower back pain during this time and some urinary frequency. She was seen by her gynecologist as well as her primary care provider at that time. Urine dips was positive for blood but no microanalysis was performed. Urine cultures were sent and both were negative for infection. Patient denies any dysuria or fevers. No history of kidney stones. Patient was never a smoker. She reports all symptoms have resolved.  She reports her menstrual cycles are regular. She is not using any hormonal birth control. Her partner has had a vasectomy.  Fluid intake consist of at least 3 sodas per day as well as coffee and tea. She states that she usually only drinks 1-2 bottles of water per day.   PMH: Past Medical History  Diagnosis Date  . Uterine fibroid   . Heart murmur   . Hypertension     boderline  . Anemia     during pregnancy  . Abnormal Pap smear   . MVP (mitral valve prolapse)   . Obesity   . GERD (gastroesophageal reflux disease)     Surgical History: Past Surgical History  Procedure Laterality Date  . Leep    . Wisdom tooth extraction      x4    Home Medications:    Medication List       This list is accurate as of: 03/14/15  4:13 PM.  Always use your most recent med list.               omeprazole 20 MG capsule  Commonly known as:  PRILOSEC  Take 1 capsule by mouth daily.     Vitamin D (Ergocalciferol) 50000 UNITS  Caps capsule  Commonly known as:  DRISDOL  Take 1 capsule (50,000 Units total) by mouth every 7 (seven) days.        Allergies: No Known Allergies  Family History: Family History  Problem Relation Age of Onset  . Cancer Mother 56    deceased at age 87 (breast)  . Diabetes Mother   . Hypertension Mother   . Cancer Maternal Aunt 32    breast  . Cancer Maternal Grandmother 70    pancreatic  . Cancer Maternal Grandfather 67    throat  . Hypertension Father   . Cancer Father 39    prostate  . Hypertension Brother   . Heart disease Paternal Grandmother     heart attack  . Heart disease Paternal Grandfather     heart attack.    Social History:  reports that she has never smoked. She has never used smokeless tobacco. She reports that she does not drink alcohol or use illicit drugs.  ROS: UROLOGY Frequent Urination?: No Hard to postpone urination?: No Burning/pain with urination?: No Get up at night to urinate?: No Leakage of urine?: No Urine stream starts and stops?: No Trouble starting stream?: No Do you have to strain to urinate?: No Blood in urine?: Yes Urinary tract infection?: No  Sexually transmitted disease?: No Injury to kidneys or bladder?: No Painful intercourse?: No Weak stream?: No Currently pregnant?: No Vaginal bleeding?: No Last menstrual period?: 03/05/15  Gastrointestinal Nausea?: No Vomiting?: No Indigestion/heartburn?: Yes Diarrhea?: No Constipation?: No  Constitutional Fever: No Night sweats?: No Weight loss?: No Fatigue?: Yes  Skin Skin rash/lesions?: No Itching?: No  Eyes Blurred vision?: No Double vision?: No  Ears/Nose/Throat Sore throat?: No Sinus problems?: Yes  Hematologic/Lymphatic Swollen glands?: Yes Easy bruising?: Yes  Cardiovascular Leg swelling?: No Chest pain?: No  Respiratory Cough?: No Shortness of breath?: No  Endocrine Excessive thirst?: No  Musculoskeletal Back pain?: Yes Joint pain?:  No  Neurological Headaches?: Yes Dizziness?: No  Psychologic Depression?: No Anxiety?: No  Physical Exam: BP 139/89 mmHg  Pulse 98  Ht 5' (1.524 m)  Wt 167 lb 4.8 oz (75.887 kg)  BMI 32.67 kg/m2  LMP 03/05/2015  Constitutional:  Alert and oriented, No acute distress. HEENT: [place] AT, moist mucus membranes.  Trachea midline, no masses. Cardiovascular: No clubbing, cyanosis, or edema. Respiratory: Normal respiratory effort, no increased work of breathing. GI: Abdomen is soft, nontender, nondistended, no abdominal masses GU: No CVA tenderness.No vaginal lesions or discharge. Normal urethral meatus. No urethral discharge, masses or tenderness. Normal anus and perineum.  Vaginal mucosa pink, with good moisture and normal rugae.  No tenderness or inflammation. Vaginal vault with good support.  No significant pelvic organ prolapse with Valsalva Cervix present and normal in appearance.  No discharge or motion tenderness. No adnexal tenderness or palpable masses.  Skin: No rashes, bruises or suspicious lesions. Lymph: No cervical or inguinal adenopathy. Neurologic: Grossly intact, no focal deficits, moving all 4 extremities. Psychiatric: Normal mood and affect.  Laboratory Data: Lab Results  Component Value Date   WBC 5.3 01/09/2015   HGB 10.9* 12/08/2006   HCT 38.1 01/09/2015   MCV 88.7 12/08/2006   PLT 298 12/08/2006    No results found for: CREATININE  No results found for: PSA  No results found for: TESTOSTERONE  Lab Results  Component Value Date   HGBA1C 5.4 01/09/2015    Urinalysis    Component Value Date/Time   GLUCOSEU Negative 03/14/2015 1103   BILIRUBINUR Negative 03/14/2015 1103   BILIRUBINUR neg 02/15/2015 1109   PROTEINUR neg 02/15/2015 1109   UROBILINOGEN 0.2 02/15/2015 1109   NITRITE Negative 03/14/2015 1103   NITRITE neg 02/15/2015 1109   LEUKOCYTESUR Negative 03/14/2015 1103   LEUKOCYTESUR Negative 02/15/2015 1109    Pertinent  Imaging: 10/11/14 CLINICAL DATA: Midline abdominal cramping, dyspareunia x1 month EXAM: TRANSABDOMINAL AND TRANSVAGINAL ULTRASOUND OF PELVIS TECHNIQUE: Both transabdominal and transvaginal ultrasound examinations of the pelvis were performed. Transabdominal technique was performed for global imaging of the pelvis including uterus, ovaries, adnexal regions, and pelvic cul-de-sac. It was necessary to proceed with endovaginal exam following the transabdominal exam to visualize the Endometrium. COMPARISON: None FINDINGS: Uterus Measurements: 8.4 x 5.7 x 6.2 cm. No fibroids or other mass visualized. Endometrium Thickness: 5 mm. No focal abnormality visualized. Right ovary Measurements: 2.7 x 2.7 x 2.8 cm. Normal appearance/no adnexal mass. Left ovary Measurements: 2.9 x 2.2 x 2.2 cm. Normal appearance/no adnexal mass. Other findings No free fluid. IMPRESSION: Negative pelvic ultrasound.  Assessment & Plan:  39 year old African-American female with possible gross hematuria history most consistent with vaginal bleeding. She is experiencing no urinary symptoms currently and bleeding has resolved  1. Microscopic hematuria- Patient had positive blood on two previous urine dips as well as 2 negative urine cultures. No  microscopic urinalysis were performed. No microscopic hematuria noted on exam today. I discussed AUA guidelines for hematuria workup with patient. Given that symptoms have completely resolved I recommend the patient follow-up in 3 months and we will recheck a micro-urinalysis. I instructed patient that if symptoms return she needs to come to our office to provide a urine specimen at that time.  Dr. Pilar Jarvis was consulted regarding patient history and plan of care.  - Urinalysis, Complete   Return in about 3 months (around 06/13/2015) for Recheck UA/hematuria.  Herbert Moors, [facility] [address] [place], [place]  [address] [phone]

## 2015-03-30 ENCOUNTER — Other Ambulatory Visit: Payer: Self-pay | Admitting: Family Medicine

## 2015-03-30 MED ORDER — OMEPRAZOLE 20 MG PO CPDR: 20.0000 mg | DELAYED_RELEASE_CAPSULE | Freq: Every day | ORAL | Status: DC

## 2015-03-30 NOTE — Telephone Encounter (Signed)
Patient is requesting a refill on Prilosec. Please send to [facility]

## 2015-06-06 ENCOUNTER — Ambulatory Visit: Payer: Managed Care, Other (non HMO) | Admitting: Family Medicine

## 2015-06-13 ENCOUNTER — Ambulatory Visit: Payer: Managed Care, Other (non HMO) | Admitting: Obstetrics and Gynecology

## 2015-06-15 ENCOUNTER — Ambulatory Visit: Payer: Managed Care, Other (non HMO) | Admitting: Family Medicine

## 2015-06-25 ENCOUNTER — Encounter: Payer: Self-pay | Admitting: Family Medicine

## 2015-06-25 ENCOUNTER — Ambulatory Visit (INDEPENDENT_AMBULATORY_CARE_PROVIDER_SITE_OTHER): Payer: Managed Care, Other (non HMO) | Admitting: Family Medicine

## 2015-06-25 VITALS — BP 126/84 | HR 99 | Resp 18 | Ht 60.0 in | Wt 172.0 lb

## 2015-06-25 DIAGNOSIS — Z124 Encounter for screening for malignant neoplasm of cervix: Secondary | ICD-10-CM

## 2015-06-25 DIAGNOSIS — Z01419 Encounter for gynecological examination (general) (routine) without abnormal findings: Secondary | ICD-10-CM

## 2015-06-25 NOTE — Progress Notes (Signed)
  Subjective:     [name] is a 39 y.o. female and is here for a comprehensive physical exam. The patient reports problems - cramping in the 2 wks prior to her cycle.  She reports improvement with Tylenol.  Social History   Social History  . Marital Status: Married    Spouse Name: N/A  . Number of Children: N/A  . Years of Education: N/A   Occupational History  . Not on file.   Social History Main Topics  . Smoking status: Never Smoker   . Smokeless tobacco: Never Used  . Alcohol Use: No  . Drug Use: No  . Sexual Activity:    Partners: Male    Birth Control/ Protection: Surgical     Comment: vasectomy   Other Topics Concern  . Not on file   Social History Narrative   Health Maintenance  Topic Date Due  . INFLUENZA VACCINE  02/05/2016  . PAP SMEAR  10/02/2017  . TETANUS/TDAP  06/15/2021  . HIV Screening  Completed    The following portions of the patient's history were reviewed and updated as appropriate: allergies, current medications, past family history, past medical history, past social history, past surgical history and problem list.  Review of Systems Pertinent items noted in HPI and remainder of comprehensive ROS otherwise negative.   Objective:    BP 126/84 mmHg  Pulse 99  Resp 18  Ht 5' (1.524 m)  Wt 172 lb (78.019 kg)  BMI 33.59 kg/m2  LMP 06/10/2015 (Approximate) General appearance: alert, cooperative and appears stated age Head: Normocephalic, without obvious abnormality, atraumatic Neck: no adenopathy, supple, symmetrical, trachea midline and thyroid not enlarged, symmetric, no tenderness/mass/nodules Lungs: clear to auscultation bilaterally Breasts: normal appearance, no masses or tenderness Heart: regular rate and rhythm, S1, S2 normal, no murmur, click, rub or gallop Abdomen: soft, non-tender; bowel sounds normal; no masses,  no organomegaly Pelvic: cervix normal in appearance, external genitalia normal, no adnexal masses or  tenderness, no cervical motion tenderness, uterus normal size, shape, and consistency and vagina normal without discharge Extremities: extremities normal, atraumatic, no cyanosis or edema Pulses: 2+ and symmetric Skin: Skin color, texture, turgor normal. No rashes or lesions Lymph nodes: Cervical, supraclavicular, and axillary nodes normal. Neurologic: Grossly normal    Assessment:    Healthy female exam.      Plan:      Problem List Items Addressed This Visit    None    Visit Diagnoses    Encounter for routine gynecological examination    -  Primary    Relevant Orders    MM DIGITAL SCREENING BILATERAL    Screening for malignant neoplasm of cervix        Relevant Orders    Pap Lb, rfx HPV all pth      Return in 1 year (on 06/24/2016) for a CPE.  See After Visit Summary for Counseling Recommendations

## 2015-06-25 NOTE — Patient Instructions (Signed)
Preventive Care for Adults, Female A healthy lifestyle and preventive care can promote health and wellness. Preventive health guidelines for women include the following key practices.  A routine yearly physical is a good way to check with your health care provider about your health and preventive screening. It is a chance to share any concerns and updates on your health and to receive a thorough exam.  Visit your dentist for a routine exam and preventive care every 6 months. Brush your teeth twice a day and floss once a day. Good oral hygiene prevents tooth decay and gum disease.  The frequency of eye exams is based on your age, health, family medical history, use of contact lenses, and other factors. Follow your health care provider's recommendations for frequency of eye exams.  Eat a healthy diet. Foods like vegetables, fruits, whole grains, low-fat dairy products, and lean protein foods contain the nutrients you need without too many calories. Decrease your intake of foods high in solid fats, added sugars, and salt. Eat the right amount of calories for you.Get information about a proper diet from your health care provider, if necessary.  Regular physical exercise is one of the most important things you can do for your health. Most adults should get at least 150 minutes of moderate-intensity exercise (any activity that increases your heart rate and causes you to sweat) each week. In addition, most adults need muscle-strengthening exercises on 2 or more days a week.  Maintain a healthy weight. The body mass index (BMI) is a screening tool to identify possible weight problems. It provides an estimate of body fat based on height and weight. Your health care provider can find your BMI and can help you achieve or maintain a healthy weight.For adults 20 years and older:  A BMI below 18.5 is considered underweight.  A BMI of 18.5 to 24.9 is normal.  A BMI of 25 to 29.9 is considered overweight.  A  BMI of 30 and above is considered obese.  Maintain normal blood lipids and cholesterol levels by exercising and minimizing your intake of saturated fat. Eat a balanced diet with plenty of fruit and vegetables. Blood tests for lipids and cholesterol should begin at age 45 and be repeated every 5 years. If your lipid or cholesterol levels are high, you are over 50, or you are at high risk for heart disease, you may need your cholesterol levels checked more frequently.Ongoing high lipid and cholesterol levels should be treated with medicines if diet and exercise are not working.  If you smoke, find out from your health care provider how to quit. If you do not use tobacco, do not start.  Lung cancer screening is recommended for adults aged 45-80 years who are at high risk for developing lung cancer because of a history of smoking. A yearly low-dose CT scan of the lungs is recommended for people who have at least a 30-pack-year history of smoking and are a current smoker or have quit within the past 15 years. A pack year of smoking is smoking an average of 1 pack of cigarettes a day for 1 year (for example: 1 pack a day for 30 years or 2 packs a day for 15 years). Yearly screening should continue until the smoker has stopped smoking for at least 15 years. Yearly screening should be stopped for people who develop a health problem that would prevent them from having lung cancer treatment.  If you are pregnant, do not drink alcohol. If you are  breastfeeding, be very cautious about drinking alcohol. If you are not pregnant and choose to drink alcohol, do not have more than 1 drink per day. One drink is considered to be 12 ounces (355 mL) of beer, 5 ounces (148 mL) of wine, or 1.5 ounces (44 mL) of liquor.  Avoid use of street drugs. Do not share needles with anyone. Ask for help if you need support or instructions about stopping the use of drugs.  High blood pressure causes heart disease and increases the risk  of stroke. Your blood pressure should be checked at least every 1 to 2 years. Ongoing high blood pressure should be treated with medicines if weight loss and exercise do not work.  If you are 73-40 years old, ask your health care provider if you should take aspirin to prevent strokes.  Diabetes screening is done by taking a blood sample to check your blood glucose level after you have not eaten for a certain period of time (fasting). If you are not overweight and you do not have risk factors for diabetes, you should be screened once every 3 years starting at age 23. If you are overweight or obese and you are 69-68 years of age, you should be screened for diabetes every year as part of your cardiovascular risk assessment.  Breast cancer screening is essential preventive care for women. You should practice "breast self-awareness." This means understanding the normal appearance and feel of your breasts and may include breast self-examination. Any changes detected, no matter how small, should be reported to a health care provider. Women in their 74s and 30s should have a clinical breast exam (CBE) by a health care provider as part of a regular health exam every 1 to 3 years. After age 35, women should have a CBE every year. Starting at age 16, women should consider having a mammogram (breast X-Gartley test) every year. Women who have a family history of breast cancer should talk to their health care provider about genetic screening. Women at a high risk of breast cancer should talk to their health care providers about having an MRI and a mammogram every year.  Breast cancer gene (BRCA)-related cancer risk assessment is recommended for women who have family members with BRCA-related cancers. BRCA-related cancers include breast, ovarian, tubal, and peritoneal cancers. Having family members with these cancers may be associated with an increased risk for harmful changes (mutations) in the breast cancer genes BRCA1 and  BRCA2. Results of the assessment will determine the need for genetic counseling and BRCA1 and BRCA2 testing.  Your health care provider may recommend that you be screened regularly for cancer of the pelvic organs (ovaries, uterus, and vagina). This screening involves a pelvic examination, including checking for microscopic changes to the surface of your cervix (Pap test). You may be encouraged to have this screening done every 3 years, beginning at age 30.  For women ages 30-65, health care providers may recommend pelvic exams and Pap testing every 3 years, or they may recommend the Pap and pelvic exam, combined with testing for human papilloma virus (HPV), every 5 years. Some types of HPV increase your risk of cervical cancer. Testing for HPV may also be done on women of any age with unclear Pap test results.  Other health care providers may not recommend any screening for nonpregnant women who are considered low risk for pelvic cancer and who do not have symptoms. Ask your health care provider if a screening pelvic exam is right for  you.  If you have had past treatment for cervical cancer or a condition that could lead to cancer, you need Pap tests and screening for cancer for at least 20 years after your treatment. If Pap tests have been discontinued, your risk factors (such as having a new sexual partner) need to be reassessed to determine if screening should resume. Some women have medical problems that increase the chance of getting cervical cancer. In these cases, your health care provider may recommend more frequent screening and Pap tests.  Colorectal cancer can be detected and often prevented. Most routine colorectal cancer screening begins at the age of 57 years and continues through age 21 years. However, your health care provider may recommend screening at an earlier age if you have risk factors for colon cancer. On a yearly basis, your health care provider may provide home test kits to check  for hidden blood in the stool. Use of a small camera at the end of a tube, to directly examine the colon (sigmoidoscopy or colonoscopy), can detect the earliest forms of colorectal cancer. Talk to your health care provider about this at age 66, when routine screening begins. Direct exam of the colon should be repeated every 5-10 years through age 33 years, unless early forms of precancerous polyps or small growths are found.  People who are at an increased risk for hepatitis B should be screened for this virus. You are considered at high risk for hepatitis B if:  You were born in a country where hepatitis B occurs often. Talk with your health care provider about which countries are considered high risk.  Your parents were born in a high-risk country and you have not received a shot to protect against hepatitis B (hepatitis B vaccine).  You have HIV or AIDS.  You use needles to inject street drugs.  You live with, or have sex with, someone who has hepatitis B.  You get hemodialysis treatment.  You take certain medicines for conditions like cancer, organ transplantation, and autoimmune conditions.  Hepatitis C blood testing is recommended for all people born from [birth year] through 1965 and any individual with known risks for hepatitis C.  Practice safe sex. Use condoms and avoid high-risk sexual practices to reduce the spread of sexually transmitted infections (STIs). STIs include gonorrhea, chlamydia, syphilis, trichomonas, herpes, HPV, and human immunodeficiency virus (HIV). Herpes, HIV, and HPV are viral illnesses that have no cure. They can result in disability, cancer, and death.  You should be screened for sexually transmitted illnesses (STIs) including gonorrhea and chlamydia if:  You are sexually active and are younger than 24 years.  You are older than 24 years and your health care provider tells you that you are at risk for this type of infection.  Your sexual activity has changed  since you were last screened and you are at an increased risk for chlamydia or gonorrhea. Ask your health care provider if you are at risk.  If you are at risk of being infected with HIV, it is recommended that you take a prescription medicine daily to prevent HIV infection. This is called preexposure prophylaxis (PrEP). You are considered at risk if:  You are sexually active and do not regularly use condoms or know the HIV status of your partner(s).  You take drugs by injection.  You are sexually active with a partner who has HIV.  Talk with your health care provider about whether you are at high risk of being infected with HIV. If  you choose to begin PrEP, you should first be tested for HIV. You should then be tested every 3 months for as long as you are taking PrEP.  Osteoporosis is a disease in which the bones lose minerals and strength with aging. This can result in serious bone fractures or breaks. The risk of osteoporosis can be identified using a bone density scan. Women ages 62 years and over and women at risk for fractures or osteoporosis should discuss screening with their health care providers. Ask your health care provider whether you should take a calcium supplement or vitamin D to reduce the rate of osteoporosis.  Menopause can be associated with physical symptoms and risks. Hormone replacement therapy is available to decrease symptoms and risks. You should talk to your health care provider about whether hormone replacement therapy is right for you.  Use sunscreen. Apply sunscreen liberally and repeatedly throughout the day. You should seek shade when your shadow is shorter than you. Protect yourself by wearing long sleeves, pants, a wide-brimmed hat, and sunglasses year round, whenever you are outdoors.  Once a month, do a whole body skin exam, using a mirror to look at the skin on your back. Tell your health care provider of new moles, moles that have irregular borders, moles that  are larger than a pencil eraser, or moles that have changed in shape or color.  Stay current with required vaccines (immunizations).  Influenza vaccine. All adults should be immunized every year.  Tetanus, diphtheria, and acellular pertussis (Td, Tdap) vaccine. Pregnant women should receive 1 dose of Tdap vaccine during each pregnancy. The dose should be obtained regardless of the length of time since the last dose. Immunization is preferred during the 27th-36th week of gestation. An adult who has not previously received Tdap or who does not know her vaccine status should receive 1 dose of Tdap. This initial dose should be followed by tetanus and diphtheria toxoids (Td) booster doses every 10 years. Adults with an unknown or incomplete history of completing a 3-dose immunization series with Td-containing vaccines should begin or complete a primary immunization series including a Tdap dose. Adults should receive a Td booster every 10 years.  Varicella vaccine. An adult without evidence of immunity to varicella should receive 2 doses or a second dose if she has previously received 1 dose. Pregnant females who do not have evidence of immunity should receive the first dose after pregnancy. This first dose should be obtained before leaving the health care facility. The second dose should be obtained 4-8 weeks after the first dose.  Human papillomavirus (HPV) vaccine. Females aged 13-26 years who have not received the vaccine previously should obtain the 3-dose series. The vaccine is not recommended for use in pregnant females. However, pregnancy testing is not needed before receiving a dose. If a female is found to be pregnant after receiving a dose, no treatment is needed. In that case, the remaining doses should be delayed until after the pregnancy. Immunization is recommended for any person with an immunocompromised condition through the age of 85 years if she did not get any or all doses earlier. During the  3-dose series, the second dose should be obtained 4-8 weeks after the first dose. The third dose should be obtained 24 weeks after the first dose and 16 weeks after the second dose.  Zoster vaccine. One dose is recommended for adults aged 41 years or older unless certain conditions are present.  Measles, mumps, and rubella (MMR) vaccine. Adults born  before 1957 generally are considered immune to measles and mumps. Adults born in 1957 or later should have 1 or more doses of MMR vaccine unless there is a contraindication to the vaccine or there is laboratory evidence of immunity to each of the three diseases. A routine second dose of MMR vaccine should be obtained at least 28 days after the first dose for students attending postsecondary schools, health care workers, or international travelers. People who received inactivated measles vaccine or an unknown type of measles vaccine during 1963-1967 should receive 2 doses of MMR vaccine. People who received inactivated mumps vaccine or an unknown type of mumps vaccine before 1979 and are at high risk for mumps infection should consider immunization with 2 doses of MMR vaccine. For females of childbearing age, rubella immunity should be determined. If there is no evidence of immunity, females who are not pregnant should be vaccinated. If there is no evidence of immunity, females who are pregnant should delay immunization until after pregnancy. Unvaccinated health care workers born before 1957 who lack laboratory evidence of measles, mumps, or rubella immunity or laboratory confirmation of disease should consider measles and mumps immunization with 2 doses of MMR vaccine or rubella immunization with 1 dose of MMR vaccine.  Pneumococcal 13-valent conjugate (PCV13) vaccine. When indicated, a person who is uncertain of his immunization history and has no record of immunization should receive the PCV13 vaccine. All adults 65 years of age and older should receive this  vaccine. An adult aged 19 years or older who has certain medical conditions and has not been previously immunized should receive 1 dose of PCV13 vaccine. This PCV13 should be followed with a dose of pneumococcal polysaccharide (PPSV23) vaccine. Adults who are at high risk for pneumococcal disease should obtain the PPSV23 vaccine at least 8 weeks after the dose of PCV13 vaccine. Adults older than 41 years of age who have normal immune system function should obtain the PPSV23 vaccine dose at least 1 year after the dose of PCV13 vaccine.  Pneumococcal polysaccharide (PPSV23) vaccine. When PCV13 is also indicated, PCV13 should be obtained first. All adults aged 65 years and older should be immunized. An adult younger than age 65 years who has certain medical conditions should be immunized. Any person who resides in a nursing home or long-term care facility should be immunized. An adult smoker should be immunized. People with an immunocompromised condition and certain other conditions should receive both PCV13 and PPSV23 vaccines. People with human immunodeficiency virus (HIV) infection should be immunized as soon as possible after diagnosis. Immunization during chemotherapy or radiation therapy should be avoided. Routine use of PPSV23 vaccine is not recommended for American Indians, [place] Natives, or people younger than 65 years unless there are medical conditions that require PPSV23 vaccine. When indicated, people who have unknown immunization and have no record of immunization should receive PPSV23 vaccine. One-time revaccination 5 years after the first dose of PPSV23 is recommended for people aged 19-64 years who have chronic kidney failure, nephrotic syndrome, asplenia, or immunocompromised conditions. People who received 1-2 doses of PPSV23 before age 65 years should receive another dose of PPSV23 vaccine at age 65 years or later if at least 5 years have passed since the previous dose. Doses of PPSV23 are not  needed for people immunized with PPSV23 at or after age 65 years.  Meningococcal vaccine. Adults with asplenia or persistent complement component deficiencies should receive 2 doses of quadrivalent meningococcal conjugate (MenACWY-D) vaccine. The doses should be obtained   at least 2 months apart. Microbiologists working with certain meningococcal bacteria, [employer] recruits, people at risk during an outbreak, and people who travel to or live in countries with a high rate of meningitis should be immunized. A first-year college student up through age 53 years who is living in a residence hall should receive a dose if she did not receive a dose on or after her 16th birthday. Adults who have certain high-risk conditions should receive one or more doses of vaccine.  Hepatitis A vaccine. Adults who wish to be protected from this disease, have certain high-risk conditions, work with hepatitis A-infected animals, work in hepatitis A research labs, or travel to or work in countries with a high rate of hepatitis A should be immunized. Adults who were previously unvaccinated and who anticipate close contact with an international adoptee during the first 60 days after arrival in the [place] from a country with a high rate of hepatitis A should be immunized.  Hepatitis B vaccine. Adults who wish to be protected from this disease, have certain high-risk conditions, may be exposed to blood or other infectious body fluids, are household contacts or sex partners of hepatitis B positive people, are clients or workers in certain care facilities, or travel to or work in countries with a high rate of hepatitis B should be immunized.  Haemophilus influenzae type b (Hib) vaccine. A previously unvaccinated person with asplenia or sickle cell disease or having a scheduled splenectomy should receive 1 dose of Hib vaccine. Regardless of previous immunization, a recipient of a hematopoietic stem cell transplant should receive a  3-dose series 6-12 months after her successful transplant. Hib vaccine is not recommended for adults with HIV infection. Preventive Services / Frequency Ages 88 to 28 years  Blood pressure check.** / Every 3-5 years.  Lipid and cholesterol check.** / Every 5 years beginning at age 73.  Clinical breast exam.** / Every 3 years for women in their 20s and 68s.  BRCA-related cancer risk assessment.** / For women who have family members with a BRCA-related cancer (breast, ovarian, tubal, or peritoneal cancers).  Pap test.** / Every 2 years from ages 6 through 27. Every 3 years starting at age 16 through age 8 or 51 with a history of 3 consecutive normal Pap tests.  HPV screening.** / Every 3 years from ages 41 through ages 57 to 43 with a history of 3 consecutive normal Pap tests.  Hepatitis C blood test.** / For any individual with known risks for hepatitis C.  Skin self-exam. / Monthly.  Influenza vaccine. / Every year.  Tetanus, diphtheria, and acellular pertussis (Tdap, Td) vaccine.** / Consult your health care provider. Pregnant women should receive 1 dose of Tdap vaccine during each pregnancy. 1 dose of Td every 10 years.  Varicella vaccine.** / Consult your health care provider. Pregnant females who do not have evidence of immunity should receive the first dose after pregnancy.  HPV vaccine. / 3 doses over 6 months, if 48 and younger. The vaccine is not recommended for use in pregnant females. However, pregnancy testing is not needed before receiving a dose.  Measles, mumps, rubella (MMR) vaccine.** / You need at least 1 dose of MMR if you were born in 1957 or later. You may also need a 2nd dose. For females of childbearing age, rubella immunity should be determined. If there is no evidence of immunity, females who are not pregnant should be vaccinated. If there is no evidence of immunity, females who are  pregnant should delay immunization until after pregnancy.  Pneumococcal  13-valent conjugate (PCV13) vaccine.** / Consult your health care provider.  Pneumococcal polysaccharide (PPSV23) vaccine.** / 1 to 2 doses if you smoke cigarettes or if you have certain conditions.  Meningococcal vaccine.** / 1 dose if you are age 68 to 8 years and a Market researcher living in a residence hall, or have one of several medical conditions, you need to get vaccinated against meningococcal disease. You may also need additional booster doses.  Hepatitis A vaccine.** / Consult your health care provider.  Hepatitis B vaccine.** / Consult your health care provider.  Haemophilus influenzae type b (Hib) vaccine.** / Consult your health care provider. Ages 7 to 53 years  Blood pressure check.** / Every year.  Lipid and cholesterol check.** / Every 5 years beginning at age 25 years.  Lung cancer screening. / Every year if you are aged 11-80 years and have a 30-pack-year history of smoking and currently smoke or have quit within the past 15 years. Yearly screening is stopped once you have quit smoking for at least 15 years or develop a health problem that would prevent you from having lung cancer treatment.  Clinical breast exam.** / Every year after age 48 years.  BRCA-related cancer risk assessment.** / For women who have family members with a BRCA-related cancer (breast, ovarian, tubal, or peritoneal cancers).  Mammogram.** / Every year beginning at age 41 years and continuing for as long as you are in good health. Consult with your health care provider.  Pap test.** / Every 3 years starting at age 65 years through age 37 or 70 years with a history of 3 consecutive normal Pap tests.  HPV screening.** / Every 3 years from ages 72 years through ages 60 to 40 years with a history of 3 consecutive normal Pap tests.  Fecal occult blood test (FOBT) of stool. / Every year beginning at age 21 years and continuing until age 5 years. You may not need to do this test if you get  a colonoscopy every 10 years.  Flexible sigmoidoscopy or colonoscopy.** / Every 5 years for a flexible sigmoidoscopy or every 10 years for a colonoscopy beginning at age 35 years and continuing until age 48 years.  Hepatitis C blood test.** / For all people born from [birth year] through 1967 and any individual with known risks for hepatitis C.  Skin self-exam. / Monthly.  Influenza vaccine. / Every year.  Tetanus, diphtheria, and acellular pertussis (Tdap/Td) vaccine.** / Consult your health care provider. Pregnant women should receive 1 dose of Tdap vaccine during each pregnancy. 1 dose of Td every 10 years.  Varicella vaccine.** / Consult your health care provider. Pregnant females who do not have evidence of immunity should receive the first dose after pregnancy.  Zoster vaccine.** / 1 dose for adults aged 30 years or older.  Measles, mumps, rubella (MMR) vaccine.** / You need at least 1 dose of MMR if you were born in 1957 or later. You may also need a second dose. For females of childbearing age, rubella immunity should be determined. If there is no evidence of immunity, females who are not pregnant should be vaccinated. If there is no evidence of immunity, females who are pregnant should delay immunization until after pregnancy.  Pneumococcal 13-valent conjugate (PCV13) vaccine.** / Consult your health care provider.  Pneumococcal polysaccharide (PPSV23) vaccine.** / 1 to 2 doses if you smoke cigarettes or if you have certain conditions.  Meningococcal vaccine.** /  Consult your health care provider.  Hepatitis A vaccine.** / Consult your health care provider.  Hepatitis B vaccine.** / Consult your health care provider.  Haemophilus influenzae type b (Hib) vaccine.** / Consult your health care provider. Ages 64 years and over  Blood pressure check.** / Every year.  Lipid and cholesterol check.** / Every 5 years beginning at age 23 years.  Lung cancer screening. / Every year if you  are aged 16-80 years and have a 30-pack-year history of smoking and currently smoke or have quit within the past 15 years. Yearly screening is stopped once you have quit smoking for at least 15 years or develop a health problem that would prevent you from having lung cancer treatment.  Clinical breast exam.** / Every year after age 74 years.  BRCA-related cancer risk assessment.** / For women who have family members with a BRCA-related cancer (breast, ovarian, tubal, or peritoneal cancers).  Mammogram.** / Every year beginning at age 44 years and continuing for as long as you are in good health. Consult with your health care provider.  Pap test.** / Every 3 years starting at age 58 years through age 22 or 39 years with 3 consecutive normal Pap tests. Testing can be stopped between 65 and 70 years with 3 consecutive normal Pap tests and no abnormal Pap or HPV tests in the past 10 years.  HPV screening.** / Every 3 years from ages 64 years through ages 70 or 61 years with a history of 3 consecutive normal Pap tests. Testing can be stopped between 65 and 70 years with 3 consecutive normal Pap tests and no abnormal Pap or HPV tests in the past 10 years.  Fecal occult blood test (FOBT) of stool. / Every year beginning at age 40 years and continuing until age 27 years. You may not need to do this test if you get a colonoscopy every 10 years.  Flexible sigmoidoscopy or colonoscopy.** / Every 5 years for a flexible sigmoidoscopy or every 10 years for a colonoscopy beginning at age 7 years and continuing until age 32 years.  Hepatitis C blood test.** / For all people born from [birth year] through 1967 and any individual with known risks for hepatitis C.  Osteoporosis screening.** / A one-time screening for women ages 30 years and over and women at risk for fractures or osteoporosis.  Skin self-exam. / Monthly.  Influenza vaccine. / Every year.  Tetanus, diphtheria, and acellular pertussis (Tdap/Td)  vaccine.** / 1 dose of Td every 10 years.  Varicella vaccine.** / Consult your health care provider.  Zoster vaccine.** / 1 dose for adults aged 35 years or older.  Pneumococcal 13-valent conjugate (PCV13) vaccine.** / Consult your health care provider.  Pneumococcal polysaccharide (PPSV23) vaccine.** / 1 dose for all adults aged 46 years and older.  Meningococcal vaccine.** / Consult your health care provider.  Hepatitis A vaccine.** / Consult your health care provider.  Hepatitis B vaccine.** / Consult your health care provider.  Haemophilus influenzae type b (Hib) vaccine.** / Consult your health care provider. ** Family history and personal history of risk and conditions may change your health care provider's recommendations.   This information is not intended to replace advice given to you by your health care provider. Make sure you discuss any questions you have with your health care provider.   Document Released: 08/19/2001 Document Revised: 07/14/2014 Document Reviewed: 11/18/2010 Elsevier Interactive Patient Education [employer].

## 2015-06-29 LAB — PAP LB, RFX HPV ALL PTH: PAP Smear Comment: 0

## 2015-07-18 ENCOUNTER — Ambulatory Visit: Payer: Managed Care, Other (non HMO) | Attending: Family Medicine

## 2015-12-10 ENCOUNTER — Telehealth: Payer: Self-pay | Admitting: *Deleted

## 2015-12-10 DIAGNOSIS — B379 Candidiasis, unspecified: Secondary | ICD-10-CM

## 2015-12-10 MED ORDER — FLUCONAZOLE 150 MG PO TABS: 150.0000 mg | ORAL_TABLET | Freq: Once | ORAL | Status: DC

## 2015-12-10 NOTE — Telephone Encounter (Signed)
Pt called c/o white vaginal discharge and itching persistent with a yeast infection.  Sent Diflucan to the pharmacy per protocol.

## 2016-03-12 ENCOUNTER — Encounter: Payer: Self-pay | Admitting: Family Medicine

## 2016-03-12 ENCOUNTER — Ambulatory Visit (INDEPENDENT_AMBULATORY_CARE_PROVIDER_SITE_OTHER): Payer: Managed Care, Other (non HMO) | Admitting: Family Medicine

## 2016-03-12 VITALS — BP 116/74 | HR 86 | Temp 98.4°F | Resp 16 | Ht 60.0 in | Wt 173.7 lb

## 2016-03-12 DIAGNOSIS — Z13220 Encounter for screening for lipoid disorders: Secondary | ICD-10-CM

## 2016-03-12 DIAGNOSIS — Z23 Encounter for immunization: Secondary | ICD-10-CM | POA: Diagnosis not present

## 2016-03-12 DIAGNOSIS — E559 Vitamin D deficiency, unspecified: Secondary | ICD-10-CM | POA: Diagnosis not present

## 2016-03-12 DIAGNOSIS — G253 Myoclonus: Secondary | ICD-10-CM

## 2016-03-12 DIAGNOSIS — K219 Gastro-esophageal reflux disease without esophagitis: Secondary | ICD-10-CM | POA: Diagnosis not present

## 2016-03-12 DIAGNOSIS — E049 Nontoxic goiter, unspecified: Secondary | ICD-10-CM

## 2016-03-12 DIAGNOSIS — Z862 Personal history of diseases of the blood and blood-forming organs and certain disorders involving the immune mechanism: Secondary | ICD-10-CM

## 2016-03-12 DIAGNOSIS — R1013 Epigastric pain: Secondary | ICD-10-CM | POA: Diagnosis not present

## 2016-03-12 DIAGNOSIS — E538 Deficiency of other specified B group vitamins: Secondary | ICD-10-CM | POA: Diagnosis not present

## 2016-03-12 DIAGNOSIS — R197 Diarrhea, unspecified: Secondary | ICD-10-CM | POA: Diagnosis not present

## 2016-03-12 NOTE — Addendum Note (Signed)
Addended by: Inda Coke on: 03/12/2016 08:54 AM   Modules accepted: Orders

## 2016-03-12 NOTE — Progress Notes (Signed)
Name: [name]   MRN: [id number]    DOB: 12-Nov-1975   Date:03/12/2016       Progress Note  Subjective  Chief Complaint  Chief Complaint  Patient presents with  . Medication Refill  . Gastroesophageal Reflux    Well controlled with medication, except pt states after every meal she is having abdominal cramping  . Blood work    Would like blood work   . Twisting    Fingers, Facial twisting has been going on and would like to discuss.     HPI  Diarrhea : she has noticed lose stools, no blood or mucus for the past year, it happens after every meal and almost every snack, associated with some abdominal cramping. No travel history prior to symptoms. No family history of GI cancer, no weight loss. Appetite is good.   GERD: started on Prilosec last year, symptoms of bloating had improved so she stopped medication , however over the past few weeks she has noticed worsening of symptom, she states there is no epigastric, no vomiting, it is mostly diarrhea after she eats and nausea. Smells does not seem to trigger symptoms  Anemia: he has a history of uterine fibroids, she is not on ocp, she states cycles are not as heavy but still lasts 5 days, cycles are regular. She feels tired, but denies pica.  Twitching: she states she has intermittent symptoms of myoclonus on hands or face almost daily, symptoms lats about 5 minutes and resolves by itself.   Patient Active Problem List   Diagnosis Date Noted  . GERD (gastroesophageal reflux disease) 03/12/2016  . MVP (mitral valve prolapse) 02/15/2015  . History of iron deficiency anemia 02/15/2015  . Vitamin D deficiency 01/11/2015  . Vitamin B12 deficiency 01/11/2015  . Heart murmur 12/29/2011  . Fibroid uterus 12/29/2011  . Family history of breast cancer 12/29/2011    Past Surgical History:  Procedure Laterality Date  . LEEP    . WISDOM TOOTH EXTRACTION     x4    Family History  Problem Relation Age of Onset  . Cancer Mother 77     deceased at age 47 (breast)  . Diabetes Mother   . Hypertension Mother   . Cancer Maternal Aunt 32    breast  . Cancer Maternal Grandmother 70    pancreatic  . Cancer Maternal Grandfather 67    throat  . Hypertension Father   . Cancer Father 13    prostate  . Hypertension Brother   . Heart disease Paternal Grandmother     heart attack  . Heart disease Paternal Grandfather     heart attack.    Social History   Social History  . Marital status: Married    Spouse name: N/A  . Number of children: N/A  . Years of education: N/A   Occupational History  . Not on file.   Social History Main Topics  . Smoking status: Never Smoker  . Smokeless tobacco: Never Used  . Alcohol use No  . Drug use: No  . Sexual activity: Yes    Partners: Male    Birth control/ protection: Surgical     Comment: vasectomy   Other Topics Concern  . Not on file   Social History Narrative  . No narrative on file     Current Outpatient Prescriptions:  .  omeprazole (PRILOSEC) 20 MG capsule, Take 1 capsule (20 mg total) by mouth daily., Disp: 30 capsule, Rfl: 2  No Known  Allergies   ROS  Constitutional: Negative for fever or weight change.  Respiratory: Negative for cough and shortness of breath.   Cardiovascular: Negative for chest pain or palpitations.  Gastrointestinal: Negative for abdominal pain, no bowel changes.  Musculoskeletal: Negative for gait problem or joint swelling.  Skin: Negative for rash.  Neurological: Negative for dizziness or headache.  No other specific complaints in a complete review of systems (except as listed in HPI above).  Objective  Vitals:   03/12/16 0822  BP: 116/74  Pulse: 86  Resp: 16  Temp: 98.4 F (36.9 C)  TempSrc: Oral  SpO2: 98%  Weight: 173 lb 11.2 oz (78.8 kg)  Height: 5' (1.524 m)    Body mass index is 33.92 kg/m.  Physical Exam  Constitutional: Patient appears well-developed and well-nourished. Obese No distress.  HEENT:  head atraumatic, normocephalic, pupils equal and reactive to light, neck supple, throat within normal limits, goiter Cardiovascular: Normal rate, regular rhythm and normal heart sounds.  No murmur heard. No BLE edema. Pulmonary/Chest: Effort normal and breath sounds normal. No respiratory distress. Abdominal: Soft.  There is no tenderness. Psychiatric: Patient has a normal mood and affect. behavior is normal. Judgment and thought content normal.   PHQ2/9: Depression screen [facility] 2/9 03/12/2016 02/15/2015  Decreased Interest 0 0  Down, Depressed, Hopeless 0 0  PHQ - 2 Score 0 0     Fall Risk: Fall Risk  03/12/2016 02/15/2015  Falls in the past year? No No     Functional Status Survey: Is the patient deaf or have difficulty hearing?: No Does the patient have difficulty seeing, even when wearing glasses/contacts?: No Does the patient have difficulty concentrating, remembering, or making decisions?: No Does the patient have difficulty walking or climbing stairs?: No Does the patient have difficulty dressing or bathing?: No Does the patient have difficulty doing errands alone such as visiting a doctor's office or shopping?: No    Assessment & Plan  1. Gastroesophageal reflux disease without esophagitis  Symptoms don't seem to be related to GERD we will try to find cause of diarrhea  2. Needs flu shot  - Flu Vaccine QUAD 36+ mos PF IM (Fluarix & Fluzone Quad PF)  3. Diarrhea, unspecified type  - TSH - Comprehensive metabolic panel - Cdiff NAA+O+P+Stool Culture - GI Profile, Stool, PCR  4. History of iron deficiency anemia  - CBC with Differential/Platelet - Iron and TIBC - Ferritin  5. Vitamin D deficiency  - VITAMIN D 25 Hydroxy (Vit-D Deficiency, Fractures)  6. Vitamin B12 deficiency  - Vitamin B12  7. Lipid screening  - Lipid panel  8. Dyspepsia  - H. pylori breath test  9. Goiter  Smooth, enlarged, seen by Dr. Tami Ribas in the past  10. Myoclonus  We  will check labs first, reassurance for now, if labs are normal and no improvement refer to neurologist

## 2016-03-12 NOTE — Patient Instructions (Signed)
Food Choices for Gastroesophageal Reflux Disease, Adult When you have gastroesophageal reflux disease (GERD), the foods you eat and your eating habits are very important. Choosing the right foods can help ease the discomfort of GERD. WHAT GENERAL GUIDELINES DO I NEED TO FOLLOW?  Choose fruits, vegetables, whole grains, low-fat dairy products, and low-fat meat, fish, and poultry.  Limit fats such as oils, salad dressings, butter, nuts, and avocado.  Keep a food diary to identify foods that cause symptoms.  Avoid foods that cause reflux. These may be different for different people.  Eat frequent small meals instead of three large meals each day.  Eat your meals slowly, in a relaxed setting.  Limit fried foods.  Cook foods using methods other than frying.  Avoid drinking alcohol.  Avoid drinking large amounts of liquids with your meals.  Avoid bending over or lying down until 2-3 hours after eating. WHAT FOODS ARE NOT RECOMMENDED? The following are some foods and drinks that may worsen your symptoms: Vegetables Tomatoes. Tomato juice. Tomato and spaghetti sauce. Chili peppers. Onion and garlic. Horseradish. Fruits Oranges, grapefruit, and lemon (fruit and juice). Meats High-fat meats, fish, and poultry. This includes hot dogs, ribs, ham, sausage, salami, and bacon. Dairy Whole milk and chocolate milk. Sour cream. Cream. Butter. Ice cream. Cream cheese.  Beverages Coffee and tea, with or without caffeine. Carbonated beverages or energy drinks. Condiments Hot sauce. Barbecue sauce.  Sweets/Desserts Chocolate and cocoa. Donuts. Peppermint and spearmint. Fats and Oils High-fat foods, including French fries and potato chips. Other Vinegar. Strong spices, such as black pepper, white pepper, red pepper, cayenne, curry powder, cloves, ginger, and chili powder. The items listed above may not be a complete list of foods and beverages to avoid. Contact your dietitian for more  information.   This information is not intended to replace advice given to you by your health care provider. Make sure you discuss any questions you have with your health care provider.   Document Released: 06/23/2005 Document Revised: 07/14/2014 Document Reviewed: 04/27/2013 Elsevier Interactive Patient Education 2016 Else[employer]  

## 2016-03-13 LAB — LIPID PANEL
Chol/HDL Ratio: 4 ratio units (ref 0.0–4.4)
Cholesterol, Total: 213 mg/dL — ABNORMAL HIGH (ref 100–199)
HDL: 53 mg/dL (ref >39)
LDL Calculated: 134 mg/dL — ABNORMAL HIGH (ref 0–99)
Triglycerides: 128 mg/dL (ref 0–149)
VLDL Cholesterol Cal: 26 mg/dL (ref 5–40)

## 2016-03-13 LAB — COMPREHENSIVE METABOLIC PANEL
ALT: 22 IU/L (ref 0–32)
AST: 18 IU/L (ref 0–40)
Albumin/Globulin Ratio: 1.4 (ref 1.2–2.2)
Albumin: 4.3 g/dL (ref 3.5–5.5)
Alkaline Phosphatase: 84 IU/L (ref 39–117)
BUN/Creatinine Ratio: 14 (ref 9–23)
BUN: 11 mg/dL (ref 6–24)
Bilirubin Total: 0.8 mg/dL (ref 0.0–1.2)
CO2: 24 mmol/L (ref 18–29)
Calcium: 9.3 mg/dL (ref 8.7–10.2)
Chloride: 102 mmol/L (ref 96–106)
Creatinine, Ser: 0.8 mg/dL (ref 0.57–1.00)
GFR calc Af Amer: 105 mL/min/{1.73_m2} (ref >59)
GFR calc non Af Amer: 91 mL/min/{1.73_m2} (ref >59)
Globulin, Total: 3 g/dL (ref 1.5–4.5)
Glucose: 88 mg/dL (ref 65–99)
Potassium: 4.2 mmol/L (ref 3.5–5.2)
Sodium: 140 mmol/L (ref 134–144)
Total Protein: 7.3 g/dL (ref 6.0–8.5)

## 2016-03-13 LAB — CBC WITH DIFFERENTIAL/PLATELET
Basophils Absolute: 0 10*3/uL (ref 0.0–0.2)
Basos: 0 %
EOS (ABSOLUTE): 0.1 10*3/uL (ref 0.0–0.4)
Eos: 1 %
Hematocrit: 40.2 % (ref 34.0–46.6)
Hemoglobin: 12.8 g/dL (ref 11.1–15.9)
Immature Grans (Abs): 0 10*3/uL (ref 0.0–0.1)
Immature Granulocytes: 0 %
Lymphocytes Absolute: 2 10*3/uL (ref 0.7–3.1)
Lymphs: 39 %
MCH: 27.2 pg (ref 26.6–33.0)
MCHC: 31.8 g/dL (ref 31.5–35.7)
MCV: 86 fL (ref 79–97)
Monocytes Absolute: 0.5 10*3/uL (ref 0.1–0.9)
Monocytes: 11 %
Neutrophils Absolute: 2.5 10*3/uL (ref 1.4–7.0)
Neutrophils: 49 %
Platelets: 374 10*3/uL (ref 150–379)
RBC: 4.7 x10E6/uL (ref 3.77–5.28)
RDW: 13.4 % (ref 12.3–15.4)
WBC: 5.1 10*3/uL (ref 3.4–10.8)

## 2016-03-13 LAB — IRON AND TIBC
Iron Saturation: 22 % (ref 15–55)
Iron: 74 ug/dL (ref 27–159)
Total Iron Binding Capacity: 330 ug/dL (ref 250–450)
UIBC: 256 ug/dL (ref 131–425)

## 2016-03-13 LAB — FERRITIN: Ferritin: 26 ng/mL (ref 15–150)

## 2016-03-13 LAB — TSH: TSH: 2.58 u[IU]/mL (ref 0.450–4.500)

## 2016-03-13 LAB — VITAMIN D 25 HYDROXY (VIT D DEFICIENCY, FRACTURES): Vit D, 25-Hydroxy: 13.3 ng/mL — ABNORMAL LOW (ref 30.0–100.0)

## 2016-03-13 LAB — VITAMIN B12: Vitamin B-12: 295 pg/mL (ref 211–946)

## 2016-03-15 LAB — H.PYLORI BREATH TEST (REFLEX): H. pylori Breath Test: NEGATIVE

## 2016-03-15 LAB — H. PYLORI BREATH TEST

## 2016-03-17 ENCOUNTER — Other Ambulatory Visit: Payer: Self-pay | Admitting: Family Medicine

## 2016-03-17 DIAGNOSIS — E559 Vitamin D deficiency, unspecified: Secondary | ICD-10-CM

## 2016-03-17 MED ORDER — VITAMIN D (ERGOCALCIFEROL) 1.25 MG (50000 UNIT) PO CAPS: 50000.0000 [IU] | ORAL_CAPSULE | ORAL | 0 refills | Status: DC

## 2016-03-24 ENCOUNTER — Telehealth: Payer: Self-pay | Admitting: Family Medicine

## 2016-03-24 NOTE — Telephone Encounter (Signed)
Patient notified via My Chart message.

## 2016-03-24 NOTE — Telephone Encounter (Signed)
I would prefer if she could come in , or to take B12 1000 mg otc SL

## 2016-04-01 ENCOUNTER — Ambulatory Visit (INDEPENDENT_AMBULATORY_CARE_PROVIDER_SITE_OTHER): Payer: Managed Care, Other (non HMO)

## 2016-04-01 DIAGNOSIS — E538 Deficiency of other specified B group vitamins: Secondary | ICD-10-CM

## 2016-04-01 MED ORDER — CYANOCOBALAMIN 1000 MCG/ML IJ SOLN
1000.0000 ug | Freq: Once | INTRAMUSCULAR | Status: AC
  Administered 2016-04-01: 1000 ug via INTRAMUSCULAR

## 2016-04-14 ENCOUNTER — Ambulatory Visit: Payer: Managed Care, Other (non HMO) | Admitting: Family Medicine

## 2016-06-18 ENCOUNTER — Other Ambulatory Visit: Payer: Self-pay | Admitting: Family Medicine

## 2016-06-18 DIAGNOSIS — E559 Vitamin D deficiency, unspecified: Secondary | ICD-10-CM

## 2016-06-18 NOTE — Telephone Encounter (Signed)
Patient requesting refill of Vitamin D to [facility]. 

## 2017-01-19 ENCOUNTER — Other Ambulatory Visit: Payer: Self-pay | Admitting: Unknown Physician Specialty

## 2017-01-19 ENCOUNTER — Encounter: Payer: Self-pay | Admitting: Family Medicine

## 2017-01-19 DIAGNOSIS — E041 Nontoxic single thyroid nodule: Secondary | ICD-10-CM

## 2017-01-30 ENCOUNTER — Ambulatory Visit: Payer: Managed Care, Other (non HMO) | Admitting: Family Medicine

## 2017-02-02 ENCOUNTER — Ambulatory Visit
Admission: RE | Admit: 2017-02-02 | Discharge: 2017-02-02 | Disposition: A | Discharge status: Home / Self Care | Payer: Commercial Managed Care - PPO | Source: Ambulatory Visit | Attending: Unknown Physician Specialty | Admitting: Unknown Physician Specialty

## 2017-02-02 DIAGNOSIS — E041 Nontoxic single thyroid nodule: Secondary | ICD-10-CM | POA: Diagnosis not present

## 2017-02-16 ENCOUNTER — Ambulatory Visit: Payer: Managed Care, Other (non HMO) | Admitting: Family Medicine

## 2017-03-19 NOTE — Progress Notes (Deleted)
Normal pap 06/2015 Normal MM 05/2014

## 2017-03-20 ENCOUNTER — Ambulatory Visit: Payer: Managed Care, Other (non HMO) | Admitting: Family Medicine

## 2017-03-23 NOTE — Progress Notes (Unsigned)
Patient did not keep appointment today. She may call to reschedule.  

## 2017-04-07 ENCOUNTER — Encounter: Payer: Self-pay | Admitting: Family Medicine

## 2017-04-07 ENCOUNTER — Ambulatory Visit (INDEPENDENT_AMBULATORY_CARE_PROVIDER_SITE_OTHER): Payer: Commercial Managed Care - PPO | Admitting: Family Medicine

## 2017-04-07 VITALS — BP 120/80 | HR 97 | Temp 98.9°F | Resp 16 | Ht 61.81 in | Wt 174.5 lb

## 2017-04-07 DIAGNOSIS — Z23 Encounter for immunization: Secondary | ICD-10-CM | POA: Diagnosis not present

## 2017-04-07 DIAGNOSIS — E049 Nontoxic goiter, unspecified: Secondary | ICD-10-CM | POA: Diagnosis not present

## 2017-04-07 DIAGNOSIS — E669 Obesity, unspecified: Secondary | ICD-10-CM | POA: Diagnosis not present

## 2017-04-07 DIAGNOSIS — Z862 Personal history of diseases of the blood and blood-forming organs and certain disorders involving the immune mechanism: Secondary | ICD-10-CM | POA: Diagnosis not present

## 2017-04-07 DIAGNOSIS — M545 Low back pain, unspecified: Secondary | ICD-10-CM

## 2017-04-07 DIAGNOSIS — E559 Vitamin D deficiency, unspecified: Secondary | ICD-10-CM | POA: Diagnosis not present

## 2017-04-07 DIAGNOSIS — E785 Hyperlipidemia, unspecified: Secondary | ICD-10-CM | POA: Diagnosis not present

## 2017-04-07 DIAGNOSIS — Z6832 Body mass index (BMI) 32.0-32.9, adult: Secondary | ICD-10-CM | POA: Diagnosis not present

## 2017-04-07 DIAGNOSIS — G8929 Other chronic pain: Secondary | ICD-10-CM

## 2017-04-07 DIAGNOSIS — Z0000 Encounter for general adult medical examination without abnormal findings: Secondary | ICD-10-CM

## 2017-04-07 DIAGNOSIS — Z01419 Encounter for gynecological examination (general) (routine) without abnormal findings: Secondary | ICD-10-CM

## 2017-04-07 DIAGNOSIS — E538 Deficiency of other specified B group vitamins: Secondary | ICD-10-CM | POA: Diagnosis not present

## 2017-04-07 NOTE — Patient Instructions (Addendum)
Semaglutide injection solution What is this medicine? SEMAGLUTIDE (Sem a GLOO tide) is used to improve blood sugar control in adults with type 2 diabetes. This medicine may be used with other diabetes medicines. This medicine may be used for other purposes; ask your health care provider or pharmacist if you have questions. COMMON BRAND NAME(S): OZEMPIC What should I tell my health care provider before I take this medicine? They need to know if you have any of these conditions: -endocrine tumors (MEN 2) or if someone in your family had these tumors -eye disease, vision problems -history of pancreatitis -kidney disease -stomach problems -thyroid cancer or if someone in your family had thyroid cancer -an unusual or allergic reaction to semaglutide, other medicines, foods, dyes, or preservatives -pregnant or trying to get pregnant -breast-feeding How should I use this medicine? This medicine is for injection under the skin of your upper leg (thigh), stomach area, or upper arm. It is given once every week (every 7 days). You will be taught how to prepare and give this medicine. Use exactly as directed. Take your medicine at regular intervals. Do not take it more often than directed. If you use this medicine with insulin, you should inject this medicine and the insulin separately. Do not mix them together. Do not give the injections right next to each other. Change (rotate) injection sites with each injection. It is important that you put your used needles and syringes in a special sharps container. Do not put them in a trash can. If you do not have a sharps container, call your pharmacist or healthcare provider to get one. A special MedGuide will be given to you by the pharmacist with each prescription and refill. Be sure to read this information carefully each time. Talk to your pediatrician regarding the use of this medicine in children. Special care may be needed. Overdosage: If you think you  have taken too much of this medicine contact a poison control center or emergency room at once. NOTE: This medicine is only for you. Do not share this medicine with others. What if I miss a dose? If you miss a dose, take it as soon as you can within 5 days after the missed dose. Then take your next dose at your regular weekly time. If it has been longer than 5 days after the missed dose, do not take the missed dose. Take the next dose at your regular time. Do not take double or extra doses. If you have questions about a missed dose, contact your health care provider for advice. What may interact with this medicine? -other medicines for diabetes Many medications may cause changes in blood sugar, these include: -alcohol containing beverages -antiviral medicines for HIV or AIDS -aspirin and aspirin-like drugs -certain medicines for blood pressure, heart disease, irregular heart beat -chromium -diuretics -female hormones, such as estrogens or progestins, birth control pills -fenofibrate -gemfibrozil -isoniazid -lanreotide -female hormones or anabolic steroids -MAOIs like Carbex, Eldepryl, Marplan, Nardil, and Parnate -medicines for weight loss -medicines for allergies, asthma, cold, or cough -medicines for depression, anxiety, or psychotic disturbances -niacin -nicotine -NSAIDs, medicines for pain and inflammation, like ibuprofen or naproxen -octreotide -pasireotide -pentamidine -phenytoin -probenecid -quinolone antibiotics such as ciprofloxacin, levofloxacin, ofloxacin -some herbal dietary supplements -steroid medicines such as prednisone or cortisone -sulfamethoxazole; trimethoprim -thyroid hormones Some medications can hide the warning symptoms of low blood sugar (hypoglycemia). You may need to monitor your blood sugar more closely if you are taking one of these medications. These include: -  beta-blockers, often used for high blood pressure or heart problems (examples include  atenolol, metoprolol, propranolol) -clonidine -guanethidine -reserpine This list may not describe all possible interactions. Give your health care provider a list of all the medicines, herbs, non-prescription drugs, or dietary supplements you use. Also tell them if you smoke, drink alcohol, or use illegal drugs. Some items may interact with your medicine. What should I watch for while using this medicine? Visit your doctor or health care professional for regular checks on your progress. Drink plenty of fluids while taking this medicine. Check with your doctor or health care professional if you get an attack of severe diarrhea, nausea, and vomiting. The loss of too much body fluid can make it dangerous for you to take this medicine. A test called the HbA1C (A1C) will be monitored. This is a simple blood test. It measures your blood sugar control over the last 2 to 3 months. You will receive this test every 3 to 6 months. Learn how to check your blood sugar. Learn the symptoms of low and high blood sugar and how to manage them. Always carry a quick-source of sugar with you in case you have symptoms of low blood sugar. Examples include hard sugar candy or glucose tablets. Make sure others know that you can choke if you eat or drink when you develop serious symptoms of low blood sugar, such as seizures or unconsciousness. They must get medical help at once. Tell your doctor or health care professional if you have high blood sugar. You might need to change the dose of your medicine. If you are sick or exercising more than usual, you might need to change the dose of your medicine. Do not skip meals. Ask your doctor or health care professional if you should avoid alcohol. Many nonprescription cough and cold products contain sugar or alcohol. These can affect blood sugar. Pens should never be shared. Even if the needle is changed, sharing may result in passing of viruses like hepatitis or HIV. Wear a medical  ID bracelet or chain, and carry a card that describes your disease and details of your medicine and dosage times. What side effects may I notice from receiving this medicine? Side effects that you should report to your doctor or health care professional as soon as possible: -allergic reactions like skin rash, itching or hives, swelling of the face, lips, or tongue -breathing problems -changes in vision -diarrhea that continues or is severe -lump or swelling on the neck -severe nausea -signs and symptoms of infection like fever or chills; cough; sore throat; pain or trouble passing urine -signs and symptoms of low blood sugar such as feeling anxious, confusion, dizziness, increased hunger, unusually weak or tired, sweating, shakiness, cold, irritable, headache, blurred vision, fast heartbeat, loss of consciousness -signs and symptoms of kidney injury like trouble passing urine or change in the amount of urine -trouble swallowing -unusual stomach upset or pain -vomiting Side effects that usually do not require medical attention (report to your doctor or health care professional if they continue or are bothersome): -constipation -diarrhea -nausea -pain, redness, or irritation at site where injected -stomach upset This list may not describe all possible side effects. Call your doctor for medical advice about side effects. You may report side effects to FDA at 1-800-FDA-1088. Where should I keep my medicine? Keep out of the reach of children. Store unopened pens in a refrigerator between 2 and 8 degrees C (36 and 46 degrees F). Do not freeze. Protect from  light and heat. After you first use the pen, it can be stored for 56 days at room temperature between 15 and 30 degrees C (59 and 86 degrees F) or in a refrigerator. Throw away your used pen after 56 days or after the expiration date, whichever comes first. Do not store your pen with the needle attached. If the needle is left on, medicine may  leak from the pen. NOTE: This sheet is a summary. It may not cover all possible information. If you have questions about this medicine, talk to your doctor, pharmacist, or health care provider.  2018 Elsevier/Gold Standard (2016-07-10 14:43:35) Preventive Care 40-64 Years, Female Preventive care refers to lifestyle choices and visits with your health care provider that can promote health and wellness. What does preventive care include?  A yearly physical exam. This is also called an annual well check.  Dental exams once or twice a year.  Routine eye exams. Ask your health care provider how often you should have your eyes checked.  Personal lifestyle choices, including: ? Daily care of your teeth and gums. ? Regular physical activity. ? Eating a healthy diet. ? Avoiding tobacco and drug use. ? Limiting alcohol use. ? Practicing safe sex. ? Taking low-dose aspirin daily starting at age 9. ? Taking vitamin and mineral supplements as recommended by your health care provider. What happens during an annual well check? The services and screenings done by your health care provider during your annual well check will depend on your age, overall health, lifestyle risk factors, and family history of disease. Counseling Your health care provider may ask you questions about your:  Alcohol use.  Tobacco use.  Drug use.  Emotional well-being.  Home and relationship well-being.  Sexual activity.  Eating habits.  Work and work Statistician.  Method of birth control.  Menstrual cycle.  Pregnancy history.  Screening You may have the following tests or measurements:  Height, weight, and BMI.  Blood pressure.  Lipid and cholesterol levels. These may be checked every 5 years, or more frequently if you are over 33 years old.  Skin check.  Lung cancer screening. You may have this screening every year starting at age 52 if you have a 30-pack-year history of smoking and currently  smoke or have quit within the past 15 years.  Fecal occult blood test (FOBT) of the stool. You may have this test every year starting at age 53.  Flexible sigmoidoscopy or colonoscopy. You may have a sigmoidoscopy every 5 years or a colonoscopy every 10 years starting at age 50.  Hepatitis C blood test.  Hepatitis B blood test.  Sexually transmitted disease (STD) testing.  Diabetes screening. This is done by checking your blood sugar (glucose) after you have not eaten for a while (fasting). You may have this done every 1-3 years.  Mammogram. This may be done every 1-2 years. Talk to your health care provider about when you should start having regular mammograms. This may depend on whether you have a family history of breast cancer.  BRCA-related cancer screening. This may be done if you have a family history of breast, ovarian, tubal, or peritoneal cancers.  Pelvic exam and Pap test. This may be done every 3 years starting at age 65. Starting at age 50, this may be done every 5 years if you have a Pap test in combination with an HPV test.  Bone density scan. This is done to screen for osteoporosis. You may have this scan if you  are at high risk for osteoporosis.  Discuss your test results, treatment options, and if necessary, the need for more tests with your health care provider. Vaccines Your health care provider may recommend certain vaccines, such as:  Influenza vaccine. This is recommended every year.  Tetanus, diphtheria, and acellular pertussis (Tdap, Td) vaccine. You may need a Td booster every 10 years.  Varicella vaccine. You may need this if you have not been vaccinated.  Zoster vaccine. You may need this after age 75.  Measles, mumps, and rubella (MMR) vaccine. You may need at least one dose of MMR if you were born in 1957 or later. You may also need a second dose.  Pneumococcal 13-valent conjugate (PCV13) vaccine. You may need this if you have certain conditions and  were not previously vaccinated.  Pneumococcal polysaccharide (PPSV23) vaccine. You may need one or two doses if you smoke cigarettes or if you have certain conditions.  Meningococcal vaccine. You may need this if you have certain conditions.  Hepatitis A vaccine. You may need this if you have certain conditions or if you travel or work in places where you may be exposed to hepatitis A.  Hepatitis B vaccine. You may need this if you have certain conditions or if you travel or work in places where you may be exposed to hepatitis B.  Haemophilus influenzae type b (Hib) vaccine. You may need this if you have certain conditions.  Talk to your health care provider about which screenings and vaccines you need and how often you need them. This information is not intended to replace advice given to you by your health care provider. Make sure you discuss any questions you have with your health care provider. Document Released: 07/20/2015 Document Revised: 03/12/2016 Document Reviewed: 04/24/2015 Elsevier Interactive Patient Education  2017 [employer].  Back Exercises The following exercises strengthen the muscles that help to support the back. They also help to keep the lower back flexible. Doing these exercises can help to prevent back pain or lessen existing pain. If you have back pain or discomfort, try doing these exercises 2-3 times each day or as told by your health care provider. When the pain goes away, do them once each day, but increase the number of times that you repeat the steps for each exercise (do more repetitions). If you do not have back pain or discomfort, do these exercises once each day or as told by your health care provider. Exercises Single Knee to Chest  Repeat these steps 3-5 times for each leg: 1. Lie on your back on a firm bed or the floor with your legs extended. 2. Bring one knee to your chest. Your other leg should stay extended and in contact with the floor. 3. Hold  your knee in place by grabbing your knee or thigh. 4. Pull on your knee until you feel a gentle stretch in your lower back. 5. Hold the stretch for 10-30 seconds. 6. Slowly release and straighten your leg.  Pelvic Tilt  Repeat these steps 5-10 times: 1. Lie on your back on a firm bed or the floor with your legs extended. 2. Bend your knees so they are pointing toward the ceiling and your feet are flat on the floor. 3. Tighten your lower abdominal muscles to press your lower back against the floor. This motion will tilt your pelvis so your tailbone points up toward the ceiling instead of pointing to your feet or the floor. 4. With gentle tension and even  breathing, hold this position for 5-10 seconds.  Cat-Cow  Repeat these steps until your lower back becomes more flexible: 1. Get into a hands-and-knees position on a firm surface. Keep your hands under your shoulders, and keep your knees under your hips. You may place padding under your knees for comfort. 2. Let your head hang down, and point your tailbone toward the floor so your lower back becomes rounded like the back of a cat. 3. Hold this position for 5 seconds. 4. Slowly lift your head and point your tailbone up toward the ceiling so your back forms a sagging arch like the back of a cow. 5. Hold this position for 5 seconds.  Press-Ups  Repeat these steps 5-10 times: 1. Lie on your abdomen (face-down) on the floor. 2. Place your palms near your head, about shoulder-width apart. 3. While you keep your back as relaxed as possible and keep your hips on the floor, slowly straighten your arms to raise the top half of your body and lift your shoulders. Do not use your back muscles to raise your upper torso. You may adjust the placement of your hands to make yourself more comfortable. 4. Hold this position for 5 seconds while you keep your back relaxed. 5. Slowly return to lying flat on the floor.  Bridges  Repeat these steps 10  times: 1. Lie on your back on a firm surface. 2. Bend your knees so they are pointing toward the ceiling and your feet are flat on the floor. 3. Tighten your buttocks muscles and lift your buttocks off of the floor until your waist is at almost the same height as your knees. You should feel the muscles working in your buttocks and the back of your thighs. If you do not feel these muscles, slide your feet 1-2 inches farther away from your buttocks. 4. Hold this position for 3-5 seconds. 5. Slowly lower your hips to the starting position, and allow your buttocks muscles to relax completely.  If this exercise is too easy, try doing it with your arms crossed over your chest. Abdominal Crunches  Repeat these steps 5-10 times: 1. Lie on your back on a firm bed or the floor with your legs extended. 2. Bend your knees so they are pointing toward the ceiling and your feet are flat on the floor. 3. Cross your arms over your chest. 4. Tip your chin slightly toward your chest without bending your neck. 5. Tighten your abdominal muscles and slowly raise your trunk (torso) high enough to lift your shoulder blades a tiny bit off of the floor. Avoid raising your torso higher than that, because it can put too much stress on your low back and it does not help to strengthen your abdominal muscles. 6. Slowly return to your starting position.  Back Lifts Repeat these steps 5-10 times: 1. Lie on your abdomen (face-down) with your arms at your sides, and rest your forehead on the floor. 2. Tighten the muscles in your legs and your buttocks. 3. Slowly lift your chest off of the floor while you keep your hips pressed to the floor. Keep the back of your head in line with the curve in your back. Your eyes should be looking at the floor. 4. Hold this position for 3-5 seconds. 5. Slowly return to your starting position.  Contact a health care provider if:  Your back pain or discomfort gets much worse when you do an  exercise.  Your back pain or discomfort does  not lessen within 2 hours after you exercise. If you have any of these problems, stop doing these exercises right away. Do not do them again unless your health care provider says that you can. Get help right away if:  You develop sudden, severe back pain. If this happens, stop doing the exercises right away. Do not do them again unless your health care provider says that you can. This information is not intended to replace advice given to you by your health care provider. Make sure you discuss any questions you have with your health care provider. Document Released: 07/31/2004 Document Revised: 10/31/2015 Document Reviewed: 08/17/2014 Elsevier Interactive Patient Education  2017 [employer].

## 2017-04-07 NOTE — Progress Notes (Signed)
Name: [name]   MRN: [id number]    DOB: [birth year]   Date:04/07/2017       Progress Note  Subjective  Chief Complaint  Chief Complaint  Patient presents with  . Annual Exam    HPI   Patient presents for annual CPE , obesity and GERD  CPE: married, has 4 children at home, she has normal libido, she occasionally has pain with intercourse ( she will discuss with gyn next week) , no vaginal discharge, nipple discharge or breast lumps. No change in bowel movements, she has mild stress incontinence and discussed kegel exercises.   GERD: she was doing well, however over the past week she noticed worsening of symptoms. She states seems to be triggered by increase in caffeine intake and spicy food. She has resumed otc Zantac and symptoms have improved. She described symptoms as indigestion and regurgitation, no burning. No weight loss.   Obesity: she has a long history of obesity, she was in her 24's when she had her first child, and gained weight and kept it with each pregnancy. Total of 5 children. Weight pre-pregnancy was 105 lbs. She states that she is trying to decrease portion size, she is not drinking sodas or sweet tea. She has not been exercising  Dyslipidemia: 134 on her last labs, she has changed her diet, we will check EKG today  Low back pain: she noticed pain on lumbar spine, worse when she gets up from her bed after studying. She states worse when sitting for a prolonged period of time, no radiculitis, no rashes, pain is intermittent, pain is described as dull.   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, not enough hours in a day  USPSTF grade A and B recommendations  Depression:  Depression screen [facility] 2/9 04/07/2017 03/12/2016 02/15/2015  Decreased Interest 0 0 0  Down, Depressed, Hopeless 0 0 0  PHQ - 2 Score 0 0 0   Hypertension: BP Readings from Last 3 Encounters:  04/07/17 120/80  03/12/16 116/74   06/25/15 126/84   Obesity: Wt Readings from Last 3 Encounters:  04/07/17 174 lb 8 oz (79.2 kg)  03/12/16 173 lb 11.2 oz (78.8 kg)  06/25/15 172 lb (78 kg)   BMI Readings from Last 3 Encounters:  04/07/17 32.11 kg/m  03/12/16 33.92 kg/m  06/25/15 33.59 kg/m    Alcohol: drinks rarely  Tobacco use: no HIV, hep B, hep C: check today  STD testing and prevention (chl/gon/syphilis): getting checked by gyn  Intimate partner violence: no Sexual History/Pain during Intercourse: sexually active, will discuss pain during intercourse with gyn Menstrual History/LMP: 02/21/2017 - she has noticed oligomenorrhea over the past 6 months, husband had vasectomy Incontinence Symptoms mild stress incontinence  Advanced Care Planning: A voluntary discussion about advance care planning including the explanation and discussion of advance directives.  Discussed health care proxy and Living will, and the patient was able to identify a health care proxy as husband.  Patient does not have a living will at present time. If patient does have living will, I have requested they bring this to the clinic to be scanned in to their chart.  Breast cancer:  HM Mammogram  Date Value Ref Range Status  05/24/2014 Normal  Final    BRCA gene screening: not done, mother had breast cancer 42, she had genetic testing done by gyn and was negative Cervical cancer screening: 2016, going to gyn next  week   Lipids:  Lab Results  Component Value Date   CHOL 213 (H) 03/12/2016   CHOL 188 01/09/2015   CHOL 183 06/16/2011   Lab Results  Component Value Date   HDL 53 03/12/2016   HDL 53 01/09/2015   HDL 55 06/16/2011   Lab Results  Component Value Date   LDLCALC 134 (H) 03/12/2016   LDLCALC 108 (H) 01/09/2015   LDLCALC 20 06/16/2011   Lab Results  Component Value Date   TRIG 128 03/12/2016   TRIG 136 01/09/2015   Lab Results  Component Value Date   CHOLHDL 4.0 03/12/2016   CHOLHDL 3.5 01/09/2015   No  results found for: LDLDIRECT  Glucose:  Glucose  Date Value Ref Range Status  03/12/2016 88 65 - 99 mg/dL Final    Skin cancer: no lesions Colorectal cancer: currently 41  years old. Lung cancer:   Low Dose CT Chest recommended if Age 76-80 years, 30 pack-year currently smoking OR have quit w/in 15years. Patient does not qualify.   Aspirin: not a candidate ECG: today    Patient Active Problem List   Diagnosis Date Noted  . GERD (gastroesophageal reflux disease) 03/12/2016  . Goiter 03/12/2016  . MVP (mitral valve prolapse) 02/15/2015  . History of iron deficiency anemia 02/15/2015  . Vitamin D deficiency 01/11/2015  . Vitamin B12 deficiency 01/11/2015  . Heart murmur 12/29/2011  . Fibroid uterus 12/29/2011  . Family history of breast cancer 12/29/2011    Past Surgical History:  Procedure Laterality Date  . LEEP    . WISDOM TOOTH EXTRACTION     x4    Family History  Problem Relation Age of Onset  . Cancer Mother 80       deceased at age 67 (breast)  . Diabetes Mother   . Hypertension Mother   . Cancer Maternal Aunt 32       breast  . Cancer Maternal Grandmother 70       pancreatic  . Cancer Maternal Grandfather 67       throat  . Hypertension Father   . Cancer Father 78       prostate  . Hypertension Brother   . Heart disease Paternal Grandmother        heart attack  . Heart disease Paternal Grandfather        heart attack.    Social History   Social History  . Marital status: Married    Spouse name: N/A  . Number of children: N/A  . Years of education: N/A   Occupational History  . Not on file.   Social History Main Topics  . Smoking status: Never Smoker  . Smokeless tobacco: Never Used  . Alcohol use No  . Drug use: No  . Sexual activity: Yes    Partners: Male    Birth control/ protection: Surgical     Comment: vasectomy   Other Topics Concern  . Not on file   Social History Narrative   Married, 5 children, one is grown and out of the  house.    Youngest born 2008.   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  Cholecalciferol (VITAMIN D) 2000 units CAPS, Take by mouth., Disp: , Rfl:  .  Cyanocobalamin (B-12 PO), Take by mouth., Disp: , Rfl:   No Known Allergies   ROS   Constitutional: Negative  for fever or weight change.  Respiratory: Negative for cough and shortness of breath.   Cardiovascular: Negative for chest pain or palpitations.  Gastrointestinal: Negative for abdominal pain, no bowel changes.  Musculoskeletal: Negative for gait problem or joint swelling.  Skin: Negative for rash.  Neurological: Negative for dizziness or headache.  No other specific complaints in a complete review of systems (except as listed in HPI above).   Objective  Vitals:   04/07/17 1035  BP: 120/80  Pulse: 97  Resp: 16  Temp: 98.9 F (37.2 C)  TempSrc: Oral  SpO2: 98%  Weight: 174 lb 8 oz (79.2 kg)  Height: 5' 1.81" (1.57 m)    Body mass index is 32.11 kg/m.  Physical Exam  Constitutional: Patient appears well-developed and well-nourished. No distress.  HENT: Head: Normocephalic and atraumatic. Ears: B TMs ok, no erythema or effusion; Nose: Nose normal. Mouth/Throat: Oropharynx is clear and moist. No oropharyngeal exudate.  Eyes: Conjunctivae and EOM are normal. Pupils are equal, round, and reactive to light. No scleral icterus.  Neck: Normal range of motion. Neck supple. No JVD present. thyromegaly present. Sees Dr. Tami Ribas  Cardiovascular: Normal rate, regular rhythm and normal heart sounds.  No murmur heard. No BLE edema. Pulmonary/Chest: Effort normal and breath sounds normal. No respiratory distress. Abdominal: Soft. Bowel sounds are normal, no distension. There is no tenderness. no masses Breast: not done, seeing gyn next week  FEMALE GENITALIA:  Not done RECTAL: not done Musculoskeletal: Normal range of  motion, but pain on lumbar spine with lateral bending, no joint effusions. No gross deformities Neurological: he is alert and oriented to person, place, and time. No cranial nerve deficit. Coordination, balance, strength, speech and gait are normal.  Skin: Skin is warm and dry. No rash noted. No erythema.  Psychiatric: Patient has a normal mood and affect. behavior is normal. Judgment and thought content normal.     PHQ2/9: Depression screen [facility] 2/9 04/07/2017 03/12/2016 02/15/2015  Decreased Interest 0 0 0  Down, Depressed, Hopeless 0 0 0  PHQ - 2 Score 0 0 0     Fall Risk: Fall Risk  04/07/2017 03/12/2016 02/15/2015  Falls in the past year? No No No     Assessment & Plan  1. Well woman exam  - Comp. Metabolic Panel (12)  2. Need for immunization against influenza  - Flu Vaccine QUAD 36+ mos IM  3. Vitamin B12 deficiency  - Cyanocobalamin (B-12 PO); Take by mouth. - Vitamin B12  4. Vitamin D deficiency  - Cholecalciferol (VITAMIN D) 2000 units CAPS; Take by mouth. - VITAMIN D 25 Hydroxy (Vit-D Deficiency, Fractures)  5. History of iron deficiency anemia  - CBC With Differential - Iron, TIBC and Ferritin Panel  6. Dyslipidemia  - Lipid panel - EKG   7. Obesity (BMI 30.0-34.9)  - Insulin, 2 Hour - Hemoglobin A1c  8. Goiter  - Thyroid Panel With TSH  9. Chronic bilateral low back pain without sciatica  Discussed home exercise Advised her to see [employer]  -USPSTF grade A and B recommendations reviewed with patient; age-appropriate recommendations, preventive care, screening tests, etc discussed and encouraged; healthy living encouraged; see AVS for patient education given to patient -Discussed importance of 150 minutes of physical activity weekly, eat two servings of fish weekly, eat one serving of tree nuts ( cashews, pistachios, pecans, almonds.Marland Kitchen) every other day, eat 6 servings of fruit/vegetables daily and drink plenty of water and avoid sweet  beverages.  -Red flags and  when to present for emergency care or RTC including fever >101.64F, chest pain, shortness of breath, new/worsening/un-resolving symptoms, reviewed with patient at time of visit. Follow up and care instructions discussed and provided in AVS.

## 2017-04-10 ENCOUNTER — Encounter: Payer: Self-pay | Admitting: Family Medicine

## 2017-04-15 ENCOUNTER — Ambulatory Visit (INDEPENDENT_AMBULATORY_CARE_PROVIDER_SITE_OTHER): Payer: Commercial Managed Care - PPO | Admitting: Family Medicine

## 2017-04-15 ENCOUNTER — Encounter: Payer: Self-pay | Admitting: Family Medicine

## 2017-04-15 DIAGNOSIS — Z01419 Encounter for gynecological examination (general) (routine) without abnormal findings: Secondary | ICD-10-CM | POA: Diagnosis not present

## 2017-04-15 DIAGNOSIS — Z124 Encounter for screening for malignant neoplasm of cervix: Secondary | ICD-10-CM

## 2017-04-15 NOTE — Patient Instructions (Signed)
Preventive Care 18-39 Years, Female Preventive care refers to lifestyle choices and visits with your health care provider that can promote health and wellness. What does preventive care include?  A yearly physical exam. This is also called an annual well check.  Dental exams once or twice a year.  Routine eye exams. Ask your health care provider how often you should have your eyes checked.  Personal lifestyle choices, including: ? Daily care of your teeth and gums. ? Regular physical activity. ? Eating a healthy diet. ? Avoiding tobacco and drug use. ? Limiting alcohol use. ? Practicing safe sex. ? Taking vitamin and mineral supplements as recommended by your health care provider. What happens during an annual well check? The services and screenings done by your health care provider during your annual well check will depend on your age, overall health, lifestyle risk factors, and family history of disease. Counseling Your health care provider may ask you questions about your:  Alcohol use.  Tobacco use.  Drug use.  Emotional well-being.  Home and relationship well-being.  Sexual activity.  Eating habits.  Work and work Statistician.  Method of birth control.  Menstrual cycle.  Pregnancy history.  Screening You may have the following tests or measurements:  Height, weight, and BMI.  Diabetes screening. This is done by checking your blood sugar (glucose) after you have not eaten for a while (fasting).  Blood pressure.  Lipid and cholesterol levels. These may be checked every 5 years starting at age 66.  Skin check.  Hepatitis C blood test.  Hepatitis B blood test.  Sexually transmitted disease (STD) testing.  BRCA-related cancer screening. This may be done if you have a family history of breast, ovarian, tubal, or peritoneal cancers.  Pelvic exam and Pap test. This may be done every 3 years starting at age 40. Starting at age 59, this may be done every 5  years if you have a Pap test in combination with an HPV test.  Discuss your test results, treatment options, and if necessary, the need for more tests with your health care provider. Vaccines Your health care provider may recommend certain vaccines, such as:  Influenza vaccine. This is recommended every year.  Tetanus, diphtheria, and acellular pertussis (Tdap, Td) vaccine. You may need a Td booster every 10 years.  Varicella vaccine. You may need this if you have not been vaccinated.  HPV vaccine. If you are 69 or younger, you may need three doses over 6 months.  Measles, mumps, and rubella (MMR) vaccine. You may need at least one dose of MMR. You may also need a second dose.  Pneumococcal 13-valent conjugate (PCV13) vaccine. You may need this if you have certain conditions and were not previously vaccinated.  Pneumococcal polysaccharide (PPSV23) vaccine. You may need one or two doses if you smoke cigarettes or if you have certain conditions.  Meningococcal vaccine. One dose is recommended if you are age 27-21 years and a first-year college student living in a residence hall, or if you have one of several medical conditions. You may also need additional booster doses.  Hepatitis A vaccine. You may need this if you have certain conditions or if you travel or work in places where you may be exposed to hepatitis A.  Hepatitis B vaccine. You may need this if you have certain conditions or if you travel or work in places where you may be exposed to hepatitis B.  Haemophilus influenzae type b (Hib) vaccine. You may need this if  you have certain risk factors.  Talk to your health care provider about which screenings and vaccines you need and how often you need them. This information is not intended to replace advice given to you by your health care provider. Make sure you discuss any questions you have with your health care provider. Document Released: 08/19/2001 Document Revised: 03/12/2016  Document Reviewed: 04/24/2015 Elsevier Interactive Patient Education  2017 [employer].

## 2017-04-15 NOTE — Progress Notes (Signed)
Would like to do BCRA test.  Patient has not had a mammogram screening. Last pap 06/25/2015

## 2017-04-15 NOTE — Progress Notes (Signed)
  Subjective:     [name] is a 41 y.o. female and is here for a comprehensive physical exam. The patient reports no problems. Cycles are all over the place. Has gone 3-7 wks late.  Come every 6-8 months. Sees Dr. Gwynneth Aliment for PCP.  Social History   Social History  . Marital status: Married    Spouse name: N/A  . Number of children: N/A  . Years of education: N/A   Occupational History  . Not on file.   Social History Main Topics  . Smoking status: Never Smoker  . Smokeless tobacco: Never Used  . Alcohol use No  . Drug use: No  . Sexual activity: Yes    Partners: Male    Birth control/ protection: Surgical     Comment: vasectomy   Other Topics Concern  . Not on file   Social History Narrative   Married, 5 children, one is grown and out of the house.    Youngest born 2008.   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 PAP SMEAR  06/24/2018  . TETANUS/TDAP  06/15/2021  . INFLUENZA VACCINE  Completed  . HIV Screening  Completed    The following portions of the patient's history were reviewed and updated as appropriate: allergies, current medications, past family history, past medical history, past social history, past surgical history and problem list.  Review of Systems Pertinent items noted in HPI and remainder of comprehensive ROS otherwise negative.   Objective:    BP (!) 145/82   Pulse (!) 105   Wt 177 lb 3.2 oz (80.4 kg)   LMP 03/29/2017 (Exact Date)   BMI 32.61 kg/m  General appearance: alert, cooperative and appears stated age Head: Normocephalic, without obvious abnormality, atraumatic Neck: no adenopathy, supple, symmetrical, trachea midline and thyroid not enlarged, symmetric, no tenderness/mass/nodules Lungs: clear to auscultation bilaterally Breasts: normal appearance, no masses or tenderness Heart: regular rate and rhythm, S1, S2 normal, no  murmur, click, rub or gallop Abdomen: soft, non-tender; bowel sounds normal; no masses,  no organomegaly Pelvic: cervix normal in appearance, external genitalia normal, no adnexal masses or tenderness, no cervical motion tenderness, uterus normal size, shape, and consistency and vagina normal without discharge Extremities: extremities normal, atraumatic, no cyanosis or edema Pulses: 2+ and symmetric Skin: Skin color, texture, turgor normal. No rashes or lesions Lymph nodes: Cervical, supraclavicular, and axillary nodes normal. Neurologic: Grossly normal    Assessment:    Healthy female exam.      Plan:      Problem List Items Addressed This Visit    None    Visit Diagnoses    Screening for malignant neoplasm of cervix       Relevant Orders   Cytology - PAP   Encounter for gynecological examination without abnormal finding       Relevant Orders   MM DIGITAL SCREENING BILATERAL     BRCA negative. Will need colon cancer screening at age 71. Watch diet and avoid salt--weight loss  See After Visit Summary for Counseling Recommendations

## 2017-04-17 LAB — CYTOLOGY - PAP
Diagnosis: NEGATIVE
HPV: NOT DETECTED

## 2017-04-21 ENCOUNTER — Ambulatory Visit (INDEPENDENT_AMBULATORY_CARE_PROVIDER_SITE_OTHER): Payer: Commercial Managed Care - PPO | Admitting: Family Medicine

## 2017-04-21 ENCOUNTER — Encounter: Payer: Self-pay | Admitting: Family Medicine

## 2017-04-21 DIAGNOSIS — M25472 Effusion, left ankle: Secondary | ICD-10-CM

## 2017-04-21 DIAGNOSIS — M545 Low back pain, unspecified: Secondary | ICD-10-CM | POA: Insufficient documentation

## 2017-04-21 DIAGNOSIS — M25471 Effusion, right ankle: Secondary | ICD-10-CM

## 2017-04-21 DIAGNOSIS — E559 Vitamin D deficiency, unspecified: Secondary | ICD-10-CM

## 2017-04-21 DIAGNOSIS — E538 Deficiency of other specified B group vitamins: Secondary | ICD-10-CM

## 2017-04-21 DIAGNOSIS — Z803 Family history of malignant neoplasm of breast: Secondary | ICD-10-CM

## 2017-04-21 NOTE — Progress Notes (Signed)
Name: [name]   MRN: [id number]    DOB: 14-Jun-1976   Date:04/21/2017       Progress Note  Subjective  Chief Complaint  Chief Complaint  Patient presents with  . 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    HPI  Intermittent low back pain: she states she has to sit all day at work and has to constant move and shift position to get comfortable, she discussed with her supervisor to get a raised desk and was told that forms needed to be filled out. No symptoms of radiculitis, but has noticed intermittent swelling on ankles at the end of the day. No bowel or bladder incontinence. Pain is described as dull, 2/10, aching, and nagging.    Patient Active Problem List   Diagnosis Date Noted  . Intermittent low back pain 04/21/2017  . GERD (gastroesophageal reflux disease) 03/12/2016  . Goiter 03/12/2016  . MVP (mitral valve prolapse) 02/15/2015  . History of iron deficiency anemia 02/15/2015  . Vitamin D deficiency 01/11/2015  . Vitamin B12 deficiency 01/11/2015  . Heart murmur 12/29/2011  . Fibroid uterus 12/29/2011  . Family history of breast cancer 12/29/2011    Past Surgical History:  Procedure Laterality Date  . LEEP    . WISDOM TOOTH EXTRACTION     x4    Family History  Problem Relation Age of Onset  . Cancer Mother 19       deceased at age 56 (breast)  . Diabetes Mother   . Hypertension Mother   . Cancer Maternal Aunt 32       breast  . Cancer Maternal Grandmother 70       pancreatic  . Cancer Maternal Grandfather 67       throat  . Hypertension Father   . Cancer Father 35       prostate  . Hypertension Brother   . Heart disease Paternal Grandmother        heart attack  . Heart disease Paternal Grandfather        heart attack.    Social History   Social History  . Marital status: Married    Spouse name: N/A  . Number of children: N/A  . Years of education: N/A   Occupational History  . Not on file.   Social History  Main Topics  . Smoking status: Never Smoker  . Smokeless tobacco: Never Used  . Alcohol use No  . Drug use: No  . Sexual activity: Yes    Partners: Male    Birth control/ protection: Surgical     Comment: vasectomy   Other Topics Concern  . Not on file   Social History Narrative   Married, 5 children, one is grown and out of the house.    Youngest born 2008.   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  Cholecalciferol (VITAMIN D) 2000 units CAPS, Take by mouth., Disp: , Rfl:  .  Cyanocobalamin (B-12 PO), Take by mouth., Disp: , Rfl:   No Known Allergies   ROS  Ten systems reviewed and is negative except as mentioned in HPI   Objective  Vitals:   04/21/17 1104  BP: 108/64  Pulse: (!) 106  Resp: 16  Temp: 98.4 F (36.9 C)  TempSrc: Oral  SpO2: 97%  Weight: 178 lb 11.2  oz (81.1 kg)  Height: 5\' 2"  (1.575 m)    Body mass index is 32.68 kg/m.  Physical Exam  Constitutional: Patient appears well-developed and well-nourished. Obese  No distress.  HEENT: head atraumatic, normocephalic, pupils equal and reactive to light,  neck supple, throat within normal limits Cardiovascular: Normal rate, regular rhythm and normal heart sounds.  No murmur heard. No BLE edema. Pulmonary/Chest: Effort normal and breath sounds normal. No respiratory distress. Abdominal: Soft.  There is no tenderness. Muscular Skeletal: no pain during exam today, normal rom, negative straight leg raise Psychiatric: Patient has a normal mood and affect. behavior is normal. Judgment and thought content normal.  Recent Results (from the past 2160 hour(s))  Cytology - PAP     Status: None   Collection Time: 04/15/17 12:00 AM  Result Value Ref Range   Adequacy      Satisfactory for evaluation  endocervical/transformation zone component PRESENT.   Diagnosis      NEGATIVE FOR INTRAEPITHELIAL LESIONS OR  MALIGNANCY.   Diagnosis      FUNGAL ORGANISMS PRESENT CONSISTENT WITH CANDIDA SPP.   HPV NOT DETECTED     Comment: Normal Reference Range - NOT Detected   Material Submitted CervicoVaginal Pap [ThinPrep Imaged]       PHQ2/9: Depression screen [facility] 2/9 04/07/2017 03/12/2016 02/15/2015  Decreased Interest 0 0 0  Down, Depressed, Hopeless 0 0 0  PHQ - 2 Score 0 0 0     Fall Risk: Fall Risk  04/07/2017 03/12/2016 02/15/2015  Falls in the past year? No No No     Functional Status Survey: Is the patient deaf or have difficulty hearing?: No Does the patient have difficulty seeing, even when wearing glasses/contacts?: No Does the patient have difficulty concentrating, remembering, or making decisions?: No Does the patient have difficulty walking or climbing stairs?: No Does the patient have difficulty dressing or bathing?: No Does the patient have difficulty doing errands alone such as visiting a doctor's office or shopping?: No    Assessment & Plan  1. Intermittent low back pain  She sits all day at work and has intermittent back pain, states improves when she shifts positions, we will fill out forms for raised desk at work, but reminded her to do the home exercises I gave to her on her last visit also . A raised desk would also help with weight loss and the swelling of her ankles  2. Edema of both ankles  Intermittent, advised to raise legs and consider compression stocking hoses, weight loss will improve symptoms.

## 2017-04-21 NOTE — Addendum Note (Signed)
Addended by: Inda Coke on: 04/21/2017 12:42 PM   Modules accepted: Orders

## 2017-04-21 NOTE — Addendum Note (Signed)
Addended by: Inda Coke on: 04/21/2017 11:42 AM   Modules accepted: Orders

## 2017-04-22 ENCOUNTER — Other Ambulatory Visit: Payer: Self-pay

## 2017-04-22 MED ORDER — FLUCONAZOLE 150 MG PO TABS: 150.0000 mg | ORAL_TABLET | Freq: Once | ORAL | 0 refills | Status: AC

## 2017-04-22 NOTE — Telephone Encounter (Signed)
Patient is requesting diflucan 150 mg be called into her pharmacy.

## 2017-04-23 LAB — IRON,TIBC AND FERRITIN PANEL
Ferritin: 32 ng/mL (ref 15–150)
Iron Saturation: 31 % (ref 15–55)
Iron: 100 ug/dL (ref 27–159)
Total Iron Binding Capacity: 318 ug/dL (ref 250–450)
UIBC: 218 ug/dL (ref 131–425)

## 2017-04-23 LAB — COMP. METABOLIC PANEL (12)
AST: 16 IU/L (ref 0–40)
Albumin/Globulin Ratio: 1.4 (ref 1.2–2.2)
Albumin: 4.1 g/dL (ref 3.5–5.5)
Alkaline Phosphatase: 71 IU/L (ref 39–117)
BUN/Creatinine Ratio: 11 (ref 9–23)
BUN: 10 mg/dL (ref 6–24)
Bilirubin Total: 0.7 mg/dL (ref 0.0–1.2)
Calcium: 9.1 mg/dL (ref 8.7–10.2)
Chloride: 105 mmol/L (ref 96–106)
Creatinine, Ser: 0.89 mg/dL (ref 0.57–1.00)
GFR calc Af Amer: 92 mL/min/{1.73_m2} (ref >59)
GFR calc non Af Amer: 80 mL/min/{1.73_m2} (ref >59)
Globulin, Total: 2.9 g/dL (ref 1.5–4.5)
Glucose: 83 mg/dL (ref 65–99)
Potassium: 4.2 mmol/L (ref 3.5–5.2)
Sodium: 139 mmol/L (ref 134–144)
Total Protein: 7 g/dL (ref 6.0–8.5)

## 2017-04-23 LAB — CBC WITH DIFFERENTIAL
Basophils Absolute: 0 10*3/uL (ref 0.0–0.2)
Basos: 0 %
EOS (ABSOLUTE): 0.1 10*3/uL (ref 0.0–0.4)
Eos: 1 %
Hematocrit: 38.6 % (ref 34.0–46.6)
Hemoglobin: 12.3 g/dL (ref 11.1–15.9)
Immature Grans (Abs): 0 10*3/uL (ref 0.0–0.1)
Immature Granulocytes: 0 %
Lymphocytes Absolute: 2.2 10*3/uL (ref 0.7–3.1)
Lymphs: 38 %
MCH: 27 pg (ref 26.6–33.0)
MCHC: 31.9 g/dL (ref 31.5–35.7)
MCV: 85 fL (ref 79–97)
Monocytes Absolute: 0.5 10*3/uL (ref 0.1–0.9)
Monocytes: 10 %
Neutrophils Absolute: 2.9 10*3/uL (ref 1.4–7.0)
Neutrophils: 51 %
RBC: 4.55 x10E6/uL (ref 3.77–5.28)
RDW: 13.7 % (ref 12.3–15.4)
WBC: 5.6 10*3/uL (ref 3.4–10.8)

## 2017-04-23 LAB — HEMOGLOBIN A1C
Est. average glucose Bld gHb Est-mCnc: 97 mg/dL
Hgb A1c MFr Bld: 5 % (ref 4.8–5.6)

## 2017-04-23 LAB — LIPID PANEL
Chol/HDL Ratio: 3.4 ratio (ref 0.0–4.4)
Cholesterol, Total: 169 mg/dL (ref 100–199)
HDL: 50 mg/dL (ref >39)
LDL Calculated: 99 mg/dL (ref 0–99)
Triglycerides: 98 mg/dL (ref 0–149)
VLDL Cholesterol Cal: 20 mg/dL (ref 5–40)

## 2017-04-23 LAB — VITAMIN D 25 HYDROXY (VIT D DEFICIENCY, FRACTURES): Vit D, 25-Hydroxy: 20.6 ng/mL — ABNORMAL LOW (ref 30.0–100.0)

## 2017-04-23 LAB — VITAMIN B12: Vitamin B-12: 384 pg/mL (ref 232–1245)

## 2017-04-23 LAB — INSULIN, 2 HOUR: Insulin, 2 hour: 12.7 u[IU]/mL (ref 0.0–145.4)

## 2017-05-06 ENCOUNTER — Encounter: Payer: Self-pay | Admitting: Family Medicine

## 2017-05-06 ENCOUNTER — Ambulatory Visit
Admission: RE | Admit: 2017-05-06 | Discharge: 2017-05-06 | Disposition: A | Discharge status: Home / Self Care | Payer: Commercial Managed Care - PPO | Source: Ambulatory Visit | Attending: Family Medicine | Admitting: Family Medicine

## 2017-05-06 ENCOUNTER — Ambulatory Visit (INDEPENDENT_AMBULATORY_CARE_PROVIDER_SITE_OTHER): Payer: Commercial Managed Care - PPO | Admitting: Family Medicine

## 2017-05-06 ENCOUNTER — Telehealth: Payer: Self-pay | Admitting: Family Medicine

## 2017-05-06 VITALS — BP 128/76 | HR 100 | Temp 98.9°F | Resp 16 | Ht 62.0 in | Wt 176.6 lb

## 2017-05-06 DIAGNOSIS — R030 Elevated blood-pressure reading, without diagnosis of hypertension: Secondary | ICD-10-CM

## 2017-05-06 DIAGNOSIS — Z01419 Encounter for gynecological examination (general) (routine) without abnormal findings: Secondary | ICD-10-CM

## 2017-05-06 DIAGNOSIS — H538 Other visual disturbances: Secondary | ICD-10-CM | POA: Diagnosis not present

## 2017-05-06 DIAGNOSIS — Z1231 Encounter for screening mammogram for malignant neoplasm of breast: Secondary | ICD-10-CM | POA: Diagnosis present

## 2017-05-06 DIAGNOSIS — E049 Nontoxic goiter, unspecified: Secondary | ICD-10-CM

## 2017-05-06 DIAGNOSIS — R51 Headache: Secondary | ICD-10-CM

## 2017-05-06 DIAGNOSIS — R519 Headache, unspecified: Secondary | ICD-10-CM

## 2017-05-06 NOTE — Patient Instructions (Addendum)
Blurred Vision Having blurred vision means that you cannot see things clearly. Your vision may seem fuzzy or out of focus. Blurred vision is a very common symptom of an eye or vision problem. Blurred vision is often a gradual blur that occurs in one eye or both eyes. There are many causes of blurred vision, including cataracts, macular degeneration, and diabetic retinopathy. Blurred vision can be diagnosed based on your symptoms and a physical exam. Tell your health care provider about any other health problems you have, any recent eye injury, and any prior surgeries. You may need to see a health care provider who specializes in eye problems (ophthalmologist). Your treatment depends on what is causing your blurred vision.  HOME CARE INSTRUCTIONS  Tell your health care provider about any changes in your blurred vision.  Do not drive or operate heavy machinery if your vision is blurry.  Keep all follow-up visits as directed by your health care provider. This is important. SEEK MEDICAL CARE IF:  Your symptoms get worse.  You have new symptoms.  You have trouble seeing at night.  You have trouble seeing up close or far away.  You have trouble noticing the difference between colors. SEEK IMMEDIATE MEDICAL CARE IF:  You have severe eye pain.  You have a severe headache.  You have flashing lights in your field of vision.  You have a sudden change in vision.  You have a sudden loss of vision.  You have vision change after an injury.  You notice drainage coming from your eyes.  You notice a rash around your eyes. This information is not intended to replace advice given to you by your health care provider. Make sure you discuss any questions you have with your health care provider. Document Released: 06/26/2003 Document Revised: 11/07/2014 Document Reviewed: 05/17/2014 Elsevier Interactive Patient Education  2017 [place] DASH stands for "Dietary Approaches to  Stop Hypertension." The DASH eating plan is a healthy eating plan that has been shown to reduce high blood pressure (hypertension). It may also reduce your risk for type 2 diabetes, heart disease, and stroke. The DASH eating plan may also help with weight loss. What are tips for following this plan? General guidelines  Avoid eating more than 2,300 mg (milligrams) of salt (sodium) a day. If you have hypertension, you may need to reduce your sodium intake to 1,500 mg a day.  Limit alcohol intake to no more than 1 drink a day for nonpregnant women and 2 drinks a day for men. One drink equals 12 oz of beer, 5 oz of wine, or 1 oz of hard liquor.  Work with your health care provider to maintain a healthy body weight or to lose weight. Ask what an ideal weight is for you.  Get at least 30 minutes of exercise that causes your heart to beat faster (aerobic exercise) most days of the week. Activities may include walking, swimming, or biking.  Work with your health care provider or diet and nutrition specialist (dietitian) to adjust your eating plan to your individual calorie needs. Reading food labels  Check food labels for the amount of sodium per serving. Choose foods with less than 5 percent of the Daily Value of sodium. Generally, foods with less than 300 mg of sodium per serving fit into this eating plan.  To find whole grains, look for the word "whole" as the first word in the ingredient list. Shopping  Buy products labeled as "low-sodium" or "no  salt added."  Buy fresh foods. Avoid canned foods and premade or frozen meals. Cooking  Avoid adding salt when cooking. Use salt-free seasonings or herbs instead of table salt or sea salt. Check with your health care provider or pharmacist before using salt substitutes.  Do not fry foods. Cook foods using healthy methods such as baking, boiling, grilling, and broiling instead.  Cook with heart-healthy oils, such as olive, canola, soybean, or  sunflower oil. Meal planning   Eat a balanced diet that includes: ? 5 or more servings of fruits and vegetables each day. At each meal, try to fill half of your plate with fruits and vegetables. ? Up to 6-8 servings of whole grains each day. ? Less than 6 oz of lean meat, poultry, or fish each day. A 3-oz serving of meat is about the same size as a deck of cards. One egg equals 1 oz. ? 2 servings of low-fat dairy each day. ? A serving of nuts, seeds, or beans 5 times each week. ? Heart-healthy fats. Healthy fats called Omega-3 fatty acids are found in foods such as flaxseeds and coldwater fish, like sardines, salmon, and mackerel.  Limit how much you eat of the following: ? Canned or prepackaged foods. ? Food that is high in trans fat, such as fried foods. ? Food that is high in saturated fat, such as fatty meat. ? Sweets, desserts, sugary drinks, and other foods with added sugar. ? Full-fat dairy products.  Do not salt foods before eating.  Try to eat at least 2 vegetarian meals each week.  Eat more home-cooked food and less restaurant, buffet, and fast food.  When eating at a restaurant, ask that your food be prepared with less salt or no salt, if possible. What foods are recommended? The items listed may not be a complete list. Talk with your dietitian about what dietary choices are best for you. Grains Whole-grain or whole-wheat bread. Whole-grain or whole-wheat pasta. Brown rice. [place]. Bulgur. Whole-grain and low-sodium cereals. Pita bread. Low-fat, low-sodium crackers. Whole-wheat flour tortillas. Vegetables Fresh or frozen vegetables (raw, steamed, roasted, or grilled). Low-sodium or reduced-sodium tomato and vegetable juice. Low-sodium or reduced-sodium tomato sauce and tomato paste. Low-sodium or reduced-sodium canned vegetables. Fruits All fresh, dried, or frozen fruit. Canned fruit in natural juice (without added sugar). Meat and other protein foods Skinless  chicken or [place]. Ground chicken or [place]. Pork with fat trimmed off. Fish and seafood. Egg whites. Dried beans, peas, or lentils. Unsalted nuts, nut butters, and seeds. Unsalted canned beans. Lean cuts of beef with fat trimmed off. Low-sodium, lean deli meat. Dairy Low-fat (1%) or fat-free (skim) milk. Fat-free, low-fat, or reduced-fat cheeses. Nonfat, low-sodium ricotta or cottage cheese. Low-fat or nonfat yogurt. Low-fat, low-sodium cheese. Fats and oils Soft margarine without trans fats. Vegetable oil. Low-fat, reduced-fat, or light mayonnaise and salad dressings (reduced-sodium). Canola, safflower, olive, soybean, and sunflower oils. Avocado. Seasoning and other foods Herbs. Spices. Seasoning mixes without salt. Unsalted popcorn and pretzels. Fat-free sweets. What foods are not recommended? The items listed may not be a complete list. Talk with your dietitian about what dietary choices are best for you. Grains Baked goods made with fat, such as croissants, muffins, or some breads. Dry pasta or rice meal packs. Vegetables Creamed or fried vegetables. Vegetables in a cheese sauce. Regular canned vegetables (not low-sodium or reduced-sodium). Regular canned tomato sauce and paste (not low-sodium or reduced-sodium). Regular tomato and vegetable juice (not low-sodium or reduced-sodium). Angie Fava. Olives. Fruits  Canned fruit in a light or heavy syrup. Fried fruit. Fruit in cream or butter sauce. Meat and other protein foods Fatty cuts of meat. Ribs. Fried meat. Berniece Salines. Sausage. Bologna and other processed lunch meats. Salami. Fatback. Hotdogs. Bratwurst. Salted nuts and seeds. Canned beans with added salt. Canned or smoked fish. Whole eggs or egg yolks. Chicken or [place] with skin. Dairy Whole or 2% milk, cream, and half-and-half. Whole or full-fat cream cheese. Whole-fat or sweetened yogurt. Full-fat cheese. Nondairy creamers. Whipped toppings. Processed cheese and cheese spreads. Fats and  oils Butter. Stick margarine. Lard. Shortening. Ghee. Bacon fat. Tropical oils, such as coconut, palm kernel, or palm oil. Seasoning and other foods Salted popcorn and pretzels. Onion salt, garlic salt, seasoned salt, table salt, and sea salt. Worcestershire sauce. Tartar sauce. Barbecue sauce. Teriyaki sauce. Soy sauce, including reduced-sodium. Steak sauce. Canned and packaged gravies. Fish sauce. Oyster sauce. Cocktail sauce. Horseradish that you find on the shelf. Ketchup. Mustard. Meat flavorings and tenderizers. Bouillon cubes. Hot sauce and Tabasco sauce. Premade or packaged marinades. Premade or packaged taco seasonings. Relishes. Regular salad dressings. Where to find more information:  National Heart, Lung, and [place]: https://wilson-eaton.com/  American Heart Association: www.heart.org Summary  The DASH eating plan is a healthy eating plan that has been shown to reduce high blood pressure (hypertension). It may also reduce your risk for type 2 diabetes, heart disease, and stroke.  With the DASH eating plan, you should limit salt (sodium) intake to 2,300 mg a day. If you have hypertension, you may need to reduce your sodium intake to 1,500 mg a day.  When on the DASH eating plan, aim to eat more fresh fruits and vegetables, whole grains, lean proteins, low-fat dairy, and heart-healthy fats.  Work with your health care provider or diet and nutrition specialist (dietitian) to adjust your eating plan to your individual calorie needs. This information is not intended to replace advice given to you by your health care provider. Make sure you discuss any questions you have with your health care provider. Document Released: 06/12/2011 Document Revised: 06/16/2016 Document Reviewed: 06/16/2016 Elsevier Interactive Patient Education  2017 [employer].

## 2017-05-06 NOTE — Telephone Encounter (Signed)
Please request eye examination records from [facility] on patient. Thanks!

## 2017-05-06 NOTE — Progress Notes (Addendum)
Name: [name]   MRN: [id number]    DOB: 31-Jan-1976   Date:05/06/2017       Progress Note  Subjective  Chief Complaint  Chief Complaint  Patient presents with  . Hypertension    was checked at [facility] this morning 147/96. headache, blurred vision    HPI  Vision Changes: She notes constant blurred vision bilaterally that has been mild over the last several months.  Today, she went to work and she couldn't focus her vision on her computer, she feels like the blurred vision is worse today than it has been.  Saw optometrist at [facility] about 3 weeks ago and obtained a new lens Rx - she did mention this issue to them, but says they did not really say anything more about it. She does state that they performed a glaucoma test and dilated her pupillary for exam and was told all appeared normal.    Stress: Goes to [employer] [facility] to study health care information, working full time for [employer], she also has 5 kids and the youngest- 34 and 45yo are causing increased stress.  Says these are her normal stressors, nothing different has occurred recently.  Elevated BP Reading: She left work today and went [facility] where [facility] 147/96.  BP in office today is at goal.  She endorses headache that started today 4/10 pain - frontal, no phonophobia or photophobia. Endorses mild nausea; denies history of migraines. No extremity weakness or changes in sensation, facial droop, confusion, or speech difficulty, dizziness/lightheadedness.  Does have occasional swelling in Bilateral ankles. She has not taken anything for headache yet today, but says she normally takes ibuprofen.  Patient Active Problem List   Diagnosis Date Noted  . Intermittent low back pain 04/21/2017  . GERD (gastroesophageal reflux disease) 03/12/2016  . Goiter 03/12/2016  . MVP (mitral valve prolapse) 02/15/2015  . History of iron deficiency anemia 02/15/2015  . Vitamin D deficiency 01/11/2015  . Vitamin B12 deficiency 01/11/2015  . Heart  murmur 12/29/2011  . Fibroid uterus 12/29/2011  . Family history of breast cancer 12/29/2011    Social History  Substance Use Topics  . Smoking status: Never Smoker  . Smokeless tobacco: Never Used  . Alcohol use No     Current Outpatient Prescriptions:  .  Cyanocobalamin (B-12 PO), Take by mouth., Disp: , Rfl:  .  Cholecalciferol (VITAMIN D) 2000 units CAPS, Take by mouth., Disp: , Rfl:   No Known Allergies  ROS  Constitutional: Negative for fever or weight change.  Respiratory: Negative for cough and shortness of breath.   Cardiovascular: Negative for chest pain or palpitations.  Gastrointestinal: Negative for abdominal pain, no bowel changes.  Musculoskeletal: Negative for gait problem or joint swelling.  Skin: Negative for rash.  Neurological: See HPI No other specific complaints in a complete review of systems (except as listed in HPI above).  Objective  Vitals:   05/06/17 1041  BP: 128/76  Pulse: 100  Resp: 16  Temp: 98.9 F (37.2 C)  TempSrc: Oral  SpO2: 97%  Weight: 176 lb 9.6 oz (80.1 kg)  Height: 5\' 2"  (1.575 m)   Body mass index is 32.3 kg/m.  Nursing Note and Vital Signs reviewed.  Physical Exam  Constitutional: Patient appears well-developed and well-nourished. Obese No distress.  HEENT: head atraumatic, normocephalic, pupils equal and reactive to light and accomodation, EOM's intact. Goiter present - has hx of same, had [place] 02/02/2017 and biopsy was NOT recommended. Cardiovascular: Normal rate,  regular rhythm, S1/S2 present.  No murmur or rub heard. No BLE edema. Pulmonary/Chest: Effort normal and breath sounds clear. No respiratory distress or retractions. Psychiatric: Patient has a normal mood and slightly blunted affect. behavior is normal. Judgment and thought content normal. Neurological: he is alert and oriented to person, place, and time. No cranial nerve deficit. Coordination, balance, strength, speech and gait are normal.  Skin: Skin is  warm and dry. No rash noted. No erythema.   Depression screen [facility] 2/9 05/06/2017 04/07/2017 03/12/2016 02/15/2015  Decreased Interest 0 0 0 0  Down, Depressed, Hopeless 0 0 0 0  PHQ - 2 Score 0 0 0 0  Altered sleeping 0 - - -  Tired, decreased energy 1 - - -  Change in appetite 2 - - -  Feeling bad or failure about yourself  0 - - -  Trouble concentrating 1 - - -  Moving slowly or fidgety/restless 0 - - -  Suicidal thoughts 0 - - -  PHQ-9 Score 4 - - -  Difficult doing work/chores Not difficult at all - - -   Recent Results (from the past 2160 hour(s))  Cytology - PAP     Status: None   Collection Time: 04/15/17 12:00 AM  Result Value Ref Range   Adequacy      Satisfactory for evaluation  endocervical/transformation zone component PRESENT.   Diagnosis      NEGATIVE FOR INTRAEPITHELIAL LESIONS OR MALIGNANCY.   Diagnosis      FUNGAL ORGANISMS PRESENT CONSISTENT WITH CANDIDA SPP.   HPV NOT DETECTED     Comment: Normal Reference Range - NOT Detected   Material Submitted CervicoVaginal Pap [ThinPrep Imaged]   Lipid panel     Status: None   Collection Time: 04/21/17 12:00 AM  Result Value Ref Range   Cholesterol, Total 169 100 - 199 mg/dL   Triglycerides 98 0 - 149 mg/dL   HDL 50 >39 mg/dL   VLDL Cholesterol Cal 20 5 - 40 mg/dL   LDL Calculated 99 0 - 99 mg/dL   Chol/HDL Ratio 3.4 0.0 - 4.4 ratio    Comment:                                   T. Chol/HDL Ratio                                             Men  Women                               1/2 Avg.Risk  3.4    3.3                                   Avg.Risk  5.0    4.4                                2X Avg.Risk  9.6    7.1                                3X  Avg.Risk 23.4   11.0   Insulin, 2 Hour     Status: None   Collection Time: 04/21/17 12:00 AM  Result Value Ref Range   Insulin, 2 hour 12.7 0.0 - 145.4 uIU/mL  Hemoglobin A1c     Status: None   Collection Time: 04/21/17 12:00 AM  Result Value Ref Range   Hgb A1c MFr Bld  5.0 4.8 - 5.6 %    Comment:          Prediabetes: 5.7 - 6.4          Diabetes: >6.4          Glycemic control for adults with diabetes: <7.0    Est. average glucose Bld gHb Est-mCnc 97 mg/dL  Comp. Metabolic Panel (12)     Status: None   Collection Time: 04/21/17 12:00 AM  Result Value Ref Range   Glucose 83 65 - 99 mg/dL   BUN 10 6 - 24 mg/dL   Creatinine, Ser 0.89 0.57 - 1.00 mg/dL   GFR calc non Af Amer 80 >59 mL/min/1.73   GFR calc Af Amer 92 >59 mL/min/1.73   BUN/Creatinine Ratio 11 9 - 23   Sodium 139 134 - 144 mmol/L   Potassium 4.2 3.5 - 5.2 mmol/L   Chloride 105 96 - 106 mmol/L   Calcium 9.1 8.7 - 10.2 mg/dL   Total Protein 7.0 6.0 - 8.5 g/dL   Albumin 4.1 3.5 - 5.5 g/dL   Globulin, Total 2.9 1.5 - 4.5 g/dL   Albumin/Globulin Ratio 1.4 1.2 - 2.2   Bilirubin Total 0.7 0.0 - 1.2 mg/dL   Alkaline Phosphatase 71 39 - 117 IU/L   AST 16 0 - 40 IU/L  CBC With Differential     Status: None   Collection Time: 04/21/17 12:00 AM  Result Value Ref Range   WBC 5.6 3.4 - 10.8 x10E3/uL   RBC 4.55 3.77 - 5.28 x10E6/uL   Hemoglobin 12.3 11.1 - 15.9 g/dL   Hematocrit 38.6 34.0 - 46.6 %   MCV 85 79 - 97 fL   MCH 27.0 26.6 - 33.0 pg   MCHC 31.9 31.5 - 35.7 g/dL   RDW 13.7 12.3 - 15.4 %   Neutrophils 51 Not Estab. %   Lymphs 38 Not Estab. %   Monocytes 10 Not Estab. %   Eos 1 Not Estab. %   Basos 0 Not Estab. %   Neutrophils Absolute 2.9 1.4 - 7.0 x10E3/uL   Lymphocytes Absolute 2.2 0.7 - 3.1 x10E3/uL   Monocytes Absolute 0.5 0.1 - 0.9 x10E3/uL   EOS (ABSOLUTE) 0.1 0.0 - 0.4 x10E3/uL   Basophils Absolute 0.0 0.0 - 0.2 x10E3/uL   Immature Granulocytes 0 Not Estab. %   Immature Grans (Abs) 0.0 0.0 - 0.1 x10E3/uL  VITAMIN D 25 Hydroxy (Vit-D Deficiency, Fractures)     Status: Abnormal   Collection Time: 04/21/17 12:00 AM  Result Value Ref Range   Vit D, 25-Hydroxy 20.6 (L) 30.0 - 100.0 ng/mL    Comment: Vitamin D deficiency has been defined by the Institute of Medicine and an  Endocrine Society practice guideline as a level of serum 25-OH vitamin D less than 20 ng/mL (1,2). The Endocrine Society went on to further define vitamin D insufficiency as a level between 21 and 29 ng/mL (2). 1. IOM (Institute of Medicine). 2010. Dietary reference    intakes for calcium and D. [place]: The    [employer]. 2. Holick MF, [place], Bischoff-Ferrari  HA, et al.    Evaluation, treatment, and prevention of vitamin D    deficiency: an Endocrine Society clinical practice    guideline. JCEM. 2011 Jul; 96(7):1911-30.   Vitamin B12     Status: None   Collection Time: 04/21/17 12:00 AM  Result Value Ref Range   Vitamin B-12 384 232 - 1,245 pg/mL  Iron, TIBC and Ferritin Panel     Status: None   Collection Time: 04/21/17 12:00 AM  Result Value Ref Range   Total Iron Binding Capacity 318 250 - 450 ug/dL   UIBC 218 131 - 425 ug/dL   Iron 100 27 - 159 ug/dL   Iron Saturation 31 15 - 55 %   Ferritin 32 15 - 150 ng/mL    Assessment & Plan  1. Blurred vision, bilateral - Visual acuity screening - Ambulatory referral to Neurology - We will request records from [facility] - note sent to CMA, Bonnita Nasuti to request these records - Advised that due to recent eye examination and worsening of symptoms, along with recent normal lab testing, we will refer to neurology for additional work-up.  2. Elevated blood pressure reading - TSH - Advised to return in 2 days for BP check, BP was at goal today, so we will hold on starting medications. - Labs were performed on 10/16/208 including CBC, CMP, A1C, and 2 hour insulin - all of which showed expected ranges, and we will not repeat today.  3. Acute nonintractable headache, unspecified headache type - Recommend OTC preparations today, rest, cool compresses. - Work note provided for today.  4. Goiter - TSH - Per 02/02/17 [place] - goiter does not meet criteria for biopsy or any additional imaging follow up.  -Red flags and  when to present for emergency care or RTC including fever >101.34F, chest pain, shortness of breath, new/worsening/un-resolving symptoms, signs & symptoms of stroke, symptoms in AVS, sudden change in vision including flashes/floaters/curtain falling effect reviewed with patient at time of visit. Follow up and care instructions discussed and provided in AVS. Return in about 2 days (around 05/08/2017) for Nurse visit BP check.

## 2017-05-07 NOTE — Telephone Encounter (Signed)
Please obtain medical release or call patient to provide medical release to [facility] vision. Thanks!

## 2017-05-07 NOTE — Telephone Encounter (Signed)
[name] on [address] in [place] stated patient came in for appointment on April 13, 2017. Patient would have to give a medical release for [place] to get her records

## 2017-05-08 ENCOUNTER — Ambulatory Visit: Payer: Commercial Managed Care - PPO

## 2017-05-08 DIAGNOSIS — R030 Elevated blood-pressure reading, without diagnosis of hypertension: Secondary | ICD-10-CM

## 2017-05-08 NOTE — Telephone Encounter (Signed)
Spoke to patient by phone, she will contact [facility] to have them fax over last office note

## 2017-05-08 NOTE — Telephone Encounter (Signed)
Documentation reviewed. Will await records.

## 2017-05-08 NOTE — Progress Notes (Signed)
BP Check. Denies any symptoms. Patient states her blurred vision has improved since replacing lens. Patient sat down to reflex and rechecked BP it came down to 136/74. Raelyn Ensign, FNP spoke with patient and recommends a 1 month Follow up with Dr. Ancil Boozer and weekly BP monitoring and bring with her to appointment.

## 2017-05-11 DIAGNOSIS — R030 Elevated blood-pressure reading, without diagnosis of hypertension: Secondary | ICD-10-CM | POA: Insufficient documentation

## 2017-05-12 ENCOUNTER — Encounter: Payer: Self-pay | Admitting: Family Medicine

## 2017-05-20 DIAGNOSIS — I10 Essential (primary) hypertension: Secondary | ICD-10-CM | POA: Insufficient documentation

## 2017-05-20 DIAGNOSIS — H538 Other visual disturbances: Secondary | ICD-10-CM | POA: Insufficient documentation

## 2017-05-20 DIAGNOSIS — G44229 Chronic tension-type headache, not intractable: Secondary | ICD-10-CM | POA: Insufficient documentation

## 2017-06-02 ENCOUNTER — Ambulatory Visit: Payer: Commercial Managed Care - PPO | Admitting: Family Medicine

## 2017-06-02 ENCOUNTER — Encounter: Payer: Self-pay | Admitting: Family Medicine

## 2017-06-02 VITALS — BP 160/90 | HR 116 | Resp 12 | Ht 62.0 in | Wt 179.9 lb

## 2017-06-02 DIAGNOSIS — I10 Essential (primary) hypertension: Secondary | ICD-10-CM | POA: Diagnosis not present

## 2017-06-02 DIAGNOSIS — H538 Other visual disturbances: Secondary | ICD-10-CM | POA: Diagnosis not present

## 2017-06-02 DIAGNOSIS — G44229 Chronic tension-type headache, not intractable: Secondary | ICD-10-CM

## 2017-06-02 MED ORDER — METOPROLOL SUCCINATE ER 25 MG PO TB24: 25.0000 mg | ORAL_TABLET | Freq: Every day | ORAL | 0 refills | Status: DC

## 2017-06-02 NOTE — Progress Notes (Signed)
Name: [name]   MRN: [id number]    DOB: 10/25/1975   Date:06/02/2017       Progress Note  Subjective  Chief Complaint  Chief Complaint  Patient presents with  . Hypertension  . Headache    HPI  Hypertension: she was diagnosed back in 2013, but never took medication, bp normalized, but was above 140/90 during visit with neurologist and very high today. She also has tachycardia, she has a goiter but no recent labs ( sees Dr. Tami Ribas). She is under a lot of stress. Works a full time job, goes to school - [employer]  ( 9 credit hours) and has 4 children at home.   Headache: seen by Dr. Melrose Nakayama, given rx for Nortriptyline but did not start it yet, advised to try it. She does not have enough hours of sleep because she is doing homework until late and has to work in the mornings. Husband works 3rd shift and works two part time jobs.   Stress: Goes to [employer] to study health care information, working full time for [employer], she also has 5 kids and the youngest- 66 and 37yo are causing increased stress ( the oldest child is not home).  Says these are her normal stressors, nothing different has occurred recently, not sleeping much - because of lack of time  Blurred vision: resolved with change in prescription glasses.    Patient Active Problem List   Diagnosis Date Noted  . Blurred vision, bilateral 05/20/2017  . Chronic tension-type headache, not intractable 05/20/2017  . Essential hypertension 05/20/2017  . Intermittent low back pain 04/21/2017  . GERD (gastroesophageal reflux disease) 03/12/2016  . Goiter 03/12/2016  . MVP (mitral valve prolapse) 02/15/2015  . History of iron deficiency anemia 02/15/2015  . Vitamin D deficiency 01/11/2015  . Vitamin B12 deficiency 01/11/2015  . Heart murmur 12/29/2011  . Fibroid uterus 12/29/2011  . Family history of breast cancer 12/29/2011    Past Surgical History:  Procedure Laterality Date  . LEEP    . WISDOM TOOTH EXTRACTION     x4     Family History  Problem Relation Age of Onset  . Cancer Mother 54       deceased at age 68 (breast)  . Diabetes Mother   . Hypertension Mother   . Breast cancer Mother 21  . Cancer Maternal Aunt 32       breast  . Breast cancer Maternal Aunt 31  . Cancer Maternal Grandmother 70       pancreatic  . Cancer Maternal Grandfather 67       throat  . Hypertension Father   . Cancer Father 78       prostate  . Hypertension Brother   . Heart disease Paternal Grandmother        heart attack  . Heart disease Paternal Grandfather        heart attack.    Social History   Socioeconomic History  . Marital status: Married    Spouse name: Not on file  . Number of children: Not on file  . Years of education: Not on file  . Highest education level: Not on file  Social Needs  . Financial resource strain: Not on file  . Food insecurity - worry: Not on file  . Food insecurity - inability: Not on file  . Transportation needs - medical: Not on file  . Transportation needs - non-medical: Not on file  Occupational History  . Not on file  Tobacco Use  . Smoking status: Never Smoker  . Smokeless tobacco: Never Used  Substance and Sexual Activity  . Alcohol use: No  . Drug use: No  . Sexual activity: Yes    Partners: Male    Birth control/protection: Surgical    Comment: vasectomy  Other Topics Concern  . Not on file  Social History Narrative   Married, 5 children, one is grown and out of the house.    Youngest born 2008.   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  Cholecalciferol (VITAMIN D) 2000 units CAPS, Take by mouth., Disp: , Rfl:  .  Cyanocobalamin (B-12 PO), Take by mouth., Disp: , Rfl:  .  nortriptyline (PAMELOR) 10 MG capsule, Take 1-2 capsules by mouth at bedtime., Disp: , Rfl:  .  metoprolol succinate (TOPROL-XL) 25 MG 24 hr tablet, Take 1 tablet (25 mg total) by  mouth daily., Disp: 30 tablet, Rfl: 0  No Known Allergies   ROS  Ten systems reviewed and is negative except as mentioned in HPI   Objective  Vitals:   06/02/17 1116  BP: (!) 160/90  Pulse: (!) 116  Resp: 12  SpO2: 98%  Weight: 179 lb 14.4 oz (81.6 kg)  Height: 5\' 2"  (1.575 m)    Body mass index is 32.9 kg/m.  Physical Exam  Constitutional: Patient appears well-developed and well-nourished. Obese No distress.  HEENT: head atraumatic, normocephalic, pupils equal and reactive to light, neck supple, throat within normal limits, thyromegaly Cardiovascular: Normal rate, regular rhythm and normal heart sounds.  No murmur heard. No BLE edema. Pulmonary/Chest: Effort normal and breath sounds normal. No respiratory distress. Abdominal: Soft.  There is no tenderness. Psychiatric: Patient has a normal mood and affect. behavior is normal. Judgment and thought content normal.  Recent Results (from the past 2160 hour(s))  Cytology - PAP     Status: None   Collection Time: 04/15/17 12:00 AM  Result Value Ref Range   Adequacy      Satisfactory for evaluation  endocervical/transformation zone component PRESENT.   Diagnosis      NEGATIVE FOR INTRAEPITHELIAL LESIONS OR MALIGNANCY.   Diagnosis      FUNGAL ORGANISMS PRESENT CONSISTENT WITH CANDIDA SPP.   HPV NOT DETECTED     Comment: Normal Reference Range - NOT Detected   Material Submitted CervicoVaginal Pap [ThinPrep Imaged]   Lipid panel     Status: None   Collection Time: 04/21/17 12:00 AM  Result Value Ref Range   Cholesterol, Total 169 100 - 199 mg/dL   Triglycerides 98 0 - 149 mg/dL   HDL 50 >39 mg/dL   VLDL Cholesterol Cal 20 5 - 40 mg/dL   LDL Calculated 99 0 - 99 mg/dL   Chol/HDL Ratio 3.4 0.0 - 4.4 ratio    Comment:                                   T. Chol/HDL Ratio                                             Men  Women  1/2 Avg.Risk  3.4    3.3                                   Avg.Risk   5.0    4.4                                2X Avg.Risk  9.6    7.1                                3X Avg.Risk 23.4   11.0   Insulin, 2 Hour     Status: None   Collection Time: 04/21/17 12:00 AM  Result Value Ref Range   Insulin, 2 hour 12.7 0.0 - 145.4 uIU/mL  Hemoglobin A1c     Status: None   Collection Time: 04/21/17 12:00 AM  Result Value Ref Range   Hgb A1c MFr Bld 5.0 4.8 - 5.6 %    Comment:          Prediabetes: 5.7 - 6.4          Diabetes: >6.4          Glycemic control for adults with diabetes: <7.0    Est. average glucose Bld gHb Est-mCnc 97 mg/dL  Comp. Metabolic Panel (12)     Status: None   Collection Time: 04/21/17 12:00 AM  Result Value Ref Range   Glucose 83 65 - 99 mg/dL   BUN 10 6 - 24 mg/dL   Creatinine, Ser 0.89 0.57 - 1.00 mg/dL   GFR calc non Af Amer 80 >59 mL/min/1.73   GFR calc Af Amer 92 >59 mL/min/1.73   BUN/Creatinine Ratio 11 9 - 23   Sodium 139 134 - 144 mmol/L   Potassium 4.2 3.5 - 5.2 mmol/L   Chloride 105 96 - 106 mmol/L   Calcium 9.1 8.7 - 10.2 mg/dL   Total Protein 7.0 6.0 - 8.5 g/dL   Albumin 4.1 3.5 - 5.5 g/dL   Globulin, Total 2.9 1.5 - 4.5 g/dL   Albumin/Globulin Ratio 1.4 1.2 - 2.2   Bilirubin Total 0.7 0.0 - 1.2 mg/dL   Alkaline Phosphatase 71 39 - 117 IU/L   AST 16 0 - 40 IU/L  CBC With Differential     Status: None   Collection Time: 04/21/17 12:00 AM  Result Value Ref Range   WBC 5.6 3.4 - 10.8 x10E3/uL   RBC 4.55 3.77 - 5.28 x10E6/uL   Hemoglobin 12.3 11.1 - 15.9 g/dL   Hematocrit 38.6 34.0 - 46.6 %   MCV 85 79 - 97 fL   MCH 27.0 26.6 - 33.0 pg   MCHC 31.9 31.5 - 35.7 g/dL   RDW 13.7 12.3 - 15.4 %   Neutrophils 51 Not Estab. %   Lymphs 38 Not Estab. %   Monocytes 10 Not Estab. %   Eos 1 Not Estab. %   Basos 0 Not Estab. %   Neutrophils Absolute 2.9 1.4 - 7.0 x10E3/uL   Lymphocytes Absolute 2.2 0.7 - 3.1 x10E3/uL   Monocytes Absolute 0.5 0.1 - 0.9 x10E3/uL   EOS (ABSOLUTE) 0.1 0.0 - 0.4 x10E3/uL   Basophils Absolute 0.0  0.0 - 0.2 x10E3/uL   Immature Granulocytes 0 Not Estab. %   Immature Grans (Abs) 0.0 0.0 - 0.1 x10E3/uL  VITAMIN  D 25 Hydroxy (Vit-D Deficiency, Fractures)     Status: Abnormal   Collection Time: 04/21/17 12:00 AM  Result Value Ref Range   Vit D, 25-Hydroxy 20.6 (L) 30.0 - 100.0 ng/mL    Comment: Vitamin D deficiency has been defined by the [place] practice guideline as a level of serum 25-OH vitamin D less than 20 ng/mL (1,2). The Endocrine Society went on to further define vitamin D insufficiency as a level between 21 and 29 ng/mL (2). 1. IOM (Institute of Medicine). 2010. Dietary reference    intakes for calcium and D. [place]: The    [employer]. 2. Holick MF, Binkley [place], Bischoff-Ferrari HA, et al.    Evaluation, treatment, and prevention of vitamin D    deficiency: an Endocrine Society clinical practice    guideline. JCEM. 2011 Jul; 96(7):1911-30.   Vitamin B12     Status: None   Collection Time: 04/21/17 12:00 AM  Result Value Ref Range   Vitamin B-12 384 232 - 1,245 pg/mL  Iron, TIBC and Ferritin Panel     Status: None   Collection Time: 04/21/17 12:00 AM  Result Value Ref Range   Total Iron Binding Capacity 318 250 - 450 ug/dL   UIBC 218 131 - 425 ug/dL   Iron 100 27 - 159 ug/dL   Iron Saturation 31 15 - 55 %   Ferritin 32 15 - 150 ng/mL     PHQ2/9: Depression screen [facility] 2/9 05/06/2017 04/07/2017 03/12/2016 02/15/2015  Decreased Interest 0 0 0 0  Down, Depressed, Hopeless 0 0 0 0  PHQ - 2 Score 0 0 0 0  Altered sleeping 0 - - -  Tired, decreased energy 1 - - -  Change in appetite 2 - - -  Feeling bad or failure about yourself  0 - - -  Trouble concentrating 1 - - -  Moving slowly or fidgety/restless 0 - - -  Suicidal thoughts 0 - - -  PHQ-9 Score 4 - - -  Difficult doing work/chores Not difficult at all - - -   GAD 7 : Generalized Anxiety Score 06/02/2017  Nervous, Anxious, on Edge 0  Control/stop  worrying 1  Worry too much - different things 1  Trouble relaxing 0  Restless 0  Easily annoyed or irritable 1  Afraid - awful might happen 0  Total GAD 7 Score 3  Anxiety Difficulty Not difficult at all     Fall Risk: Fall Risk  06/02/2017 04/07/2017 03/12/2016 02/15/2015  Falls in the past year? No No No No     Functional Status Survey: Is the patient deaf or have difficulty hearing?: No Does the patient have difficulty seeing, even when wearing glasses/contacts?: No Does the patient have difficulty concentrating, remembering, or making decisions?: No Does the patient have difficulty walking or climbing stairs?: No Does the patient have difficulty dressing or bathing?: No Does the patient have difficulty doing errands alone such as visiting a doctor's office or shopping?: No   Assessment & Plan  1. Chronic tension-type headache, not intractable  - metoprolol succinate (TOPROL-XL) 25 MG 24 hr tablet; Take 1 tablet (25 mg total) by mouth daily.  Dispense: 30 tablet; Refill: 0  2. Blurred vision, bilateral  Doing better since she changed rx glasses   3. Essential hypertension  She feels overwhelmed, but denies depression, not sleeping much ( she does not have enough time to sleep at night) , tachycardia, we will  try a beta-blocker - TSH - Urine Microalbumin w/creat. ratio - metoprolol succinate (TOPROL-XL) 25 MG 24 hr tablet; Take 1 tablet (25 mg total) by mouth daily.  Dispense: 30 tablet; Refill: 0

## 2017-06-22 ENCOUNTER — Telehealth: Payer: Self-pay | Admitting: Family Medicine

## 2017-06-22 NOTE — Telephone Encounter (Signed)
Please call patient and remind her to come in for labs - TSH, Insulin level, and Urine Micro. Thanks!

## 2017-06-29 ENCOUNTER — Other Ambulatory Visit: Payer: Self-pay | Admitting: Family Medicine

## 2017-06-29 DIAGNOSIS — I10 Essential (primary) hypertension: Secondary | ICD-10-CM

## 2017-06-29 DIAGNOSIS — G44229 Chronic tension-type headache, not intractable: Secondary | ICD-10-CM

## 2017-06-29 NOTE — Telephone Encounter (Signed)
Hypertension medication request: Metoprolol to [facility].  Last office visit pertaining to hypertension: 06/02/2017  BP Readings from Last 3 Encounters:  06/02/17 (!) 160/90  05/08/17 136/74  05/06/17 128/76    Lab Results  Component Value Date   CREATININE 0.89 04/21/2017   BUN 10 04/21/2017   NA 139 04/21/2017   K 4.2 04/21/2017   CL 105 04/21/2017   CO2 24 03/12/2016     Follow up on : 07/10/2017

## 2017-07-08 ENCOUNTER — Ambulatory Visit: Payer: Commercial Managed Care - PPO | Admitting: Family Medicine

## 2017-07-10 ENCOUNTER — Ambulatory Visit: Payer: Commercial Managed Care - PPO | Admitting: Family Medicine

## 2017-07-10 ENCOUNTER — Encounter: Payer: Self-pay | Admitting: Family Medicine

## 2017-07-10 VITALS — BP 124/68 | HR 98 | Temp 98.2°F | Resp 18 | Ht 62.0 in | Wt 183.1 lb

## 2017-07-10 DIAGNOSIS — I10 Essential (primary) hypertension: Secondary | ICD-10-CM

## 2017-07-10 DIAGNOSIS — G44229 Chronic tension-type headache, not intractable: Secondary | ICD-10-CM

## 2017-07-10 DIAGNOSIS — E049 Nontoxic goiter, unspecified: Secondary | ICD-10-CM | POA: Diagnosis not present

## 2017-07-10 MED ORDER — METOPROLOL SUCCINATE ER 25 MG PO TB24: 25.0000 mg | ORAL_TABLET | Freq: Every day | ORAL | 3 refills | Status: DC

## 2017-07-10 NOTE — Progress Notes (Signed)
Name: [name]   MRN: [id number]    DOB: 1976-01-29   Date:07/10/2017       Progress Note  Subjective  Chief Complaint  Chief Complaint  Patient presents with  . Follow-up  . Hypertension    Doing well on Metoprolol, denies any symptoms    HPI  HTN: she was seen one month ago and bp was very high, she also had tachycardia, her bp is back to normal today. She has not been checking bp at home. She is taking Metoprolol daily and denies side effects. She continues to have morning headaches, out of nortriptyline but has follow up with Dr. Melrose Nakayama coming up, she has also seen Dr. Tami Ribas and is going to have a sleep study scheduled. Explained that a lot of her problems may be lack of sleep and stress from being a mom of 5 children ( 4 still at home), working full time and going to school. She has a goiter and will have a thyroid US done soon.    Patient Active Problem List   Diagnosis Date Noted  . Blurred vision, bilateral 05/20/2017  . Chronic tension-type headache, not intractable 05/20/2017  . Essential hypertension 05/20/2017  . Intermittent low back pain 04/21/2017  . GERD (gastroesophageal reflux disease) 03/12/2016  . Goiter 03/12/2016  . MVP (mitral valve prolapse) 02/15/2015  . History of iron deficiency anemia 02/15/2015  . Vitamin D deficiency 01/11/2015  . Vitamin B12 deficiency 01/11/2015  . Heart murmur 12/29/2011  . Fibroid uterus 12/29/2011  . Family history of breast cancer 12/29/2011    Past Surgical History:  Procedure Laterality Date  . LEEP    . WISDOM TOOTH EXTRACTION     x4    Family History  Problem Relation Age of Onset  . Cancer Mother 53       deceased at age 36 (breast)  . Diabetes Mother   . Hypertension Mother   . Breast cancer Mother 14  . Cancer Maternal Aunt 32       breast  . Breast cancer Maternal Aunt 31  . Cancer Maternal Grandmother 70       pancreatic  . Cancer Maternal Grandfather 67       throat  . Hypertension Father    . Cancer Father 55       prostate  . Hypertension Brother   . Heart disease Paternal Grandmother        heart attack  . Heart disease Paternal Grandfather        heart attack.    Social History   Socioeconomic History  . Marital status: Married    Spouse name: Not on file  . Number of children: Not on file  . Years of education: Not on file  . Highest education level: Not on file  Social Needs  . Financial resource strain: Not on file  . Food insecurity - worry: Not on file  . Food insecurity - inability: Not on file  . Transportation needs - medical: Not on file  . Transportation needs - non-medical: Not on file  Occupational History  . Not on file  Tobacco Use  . Smoking status: Never Smoker  . Smokeless tobacco: Never Used  Substance and Sexual Activity  . Alcohol use: No  . Drug use: No  . Sexual activity: Yes    Partners: Male    Birth control/protection: Surgical    Comment: vasectomy  Other Topics Concern  . Not on file  Social History  Narrative   Married, 5 children, one is grown and out of the house.    Youngest born 2008.   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  Cholecalciferol (VITAMIN D) 2000 units CAPS, Take by mouth., Disp: , Rfl:  .  Cyanocobalamin (B-12 PO), Take by mouth., Disp: , Rfl:  .  metoprolol succinate (TOPROL-XL) 25 MG 24 hr tablet, TAKE 1 TABLET BY MOUTH EVERY DAY, Disp: 30 tablet, Rfl: 0 .  nortriptyline (PAMELOR) 10 MG capsule, Take 1-2 capsules by mouth at bedtime., Disp: , Rfl:   No Known Allergies   ROS  Constitutional: Negative for fever or weight change.  Respiratory: Negative for cough and shortness of breath.   Cardiovascular: Negative for chest pain or palpitations.  Gastrointestinal: Negative for abdominal pain, no bowel changes.  Musculoskeletal: Negative for gait problem or joint swelling.  Skin: Negative for  rash.  Neurological: Negative for dizziness , positive for morning headache.  No other specific complaints in a complete review of systems (except as listed in HPI above).  Objective  Vitals:   07/10/17 1443  BP: 124/68  Pulse: 98  Resp: 18  Temp: 98.2 F (36.8 C)  TempSrc: Oral  SpO2: 98%  Weight: 183 lb 1.6 oz (83.1 kg)  Height: 5\' 2"  (1.575 m)    Body mass index is 33.49 kg/m.  Physical Exam  Constitutional: Patient appears well-developed and well-nourished. Obese  No distress.  HEENT: head atraumatic, normocephalic, pupils equal and reactive to light, neck supple, throat within normal limits, goiter present  Cardiovascular: Normal rate, regular rhythm and normal heart sounds.  No murmur heard. No BLE edema. Pulmonary/Chest: Effort normal and breath sounds normal. No respiratory distress. Abdominal: Soft.  There is no tenderness. Psychiatric: Patient has a normal mood and affect. behavior is normal. Judgment and thought content normal.  Recent Results (from the past 2160 hour(s))  Cytology - PAP     Status: None   Collection Time: 04/15/17 12:00 AM  Result Value Ref Range   Adequacy      Satisfactory for evaluation  endocervical/transformation zone component PRESENT.   Diagnosis      NEGATIVE FOR INTRAEPITHELIAL LESIONS OR MALIGNANCY.   Diagnosis      FUNGAL ORGANISMS PRESENT CONSISTENT WITH CANDIDA SPP.   HPV NOT DETECTED     Comment: Normal Reference Range - NOT Detected   Material Submitted CervicoVaginal Pap [ThinPrep Imaged]   Lipid panel     Status: None   Collection Time: 04/21/17 12:00 AM  Result Value Ref Range   Cholesterol, Total 169 100 - 199 mg/dL   Triglycerides 98 0 - 149 mg/dL   HDL 50 >39 mg/dL   VLDL Cholesterol Cal 20 5 - 40 mg/dL   LDL Calculated 99 0 - 99 mg/dL   Chol/HDL Ratio 3.4 0.0 - 4.4 ratio    Comment:                                   T. Chol/HDL Ratio                                             Men  Women  1/2 Avg.Risk  3.4    3.3                                   Avg.Risk  5.0    4.4                                2X Avg.Risk  9.6    7.1                                3X Avg.Risk 23.4   11.0   Insulin, 2 Hour     Status: None   Collection Time: 04/21/17 12:00 AM  Result Value Ref Range   Insulin, 2 hour 12.7 0.0 - 145.4 uIU/mL  Hemoglobin A1c     Status: None   Collection Time: 04/21/17 12:00 AM  Result Value Ref Range   Hgb A1c MFr Bld 5.0 4.8 - 5.6 %    Comment:          Prediabetes: 5.7 - 6.4          Diabetes: >6.4          Glycemic control for adults with diabetes: <7.0    Est. average glucose Bld gHb Est-mCnc 97 mg/dL  Comp. Metabolic Panel (12)     Status: None   Collection Time: 04/21/17 12:00 AM  Result Value Ref Range   Glucose 83 65 - 99 mg/dL   BUN 10 6 - 24 mg/dL   Creatinine, Ser 0.89 0.57 - 1.00 mg/dL   GFR calc non Af Amer 80 >59 mL/min/1.73   GFR calc Af Amer 92 >59 mL/min/1.73   BUN/Creatinine Ratio 11 9 - 23   Sodium 139 134 - 144 mmol/L   Potassium 4.2 3.5 - 5.2 mmol/L   Chloride 105 96 - 106 mmol/L   Calcium 9.1 8.7 - 10.2 mg/dL   Total Protein 7.0 6.0 - 8.5 g/dL   Albumin 4.1 3.5 - 5.5 g/dL   Globulin, Total 2.9 1.5 - 4.5 g/dL   Albumin/Globulin Ratio 1.4 1.2 - 2.2   Bilirubin Total 0.7 0.0 - 1.2 mg/dL   Alkaline Phosphatase 71 39 - 117 IU/L   AST 16 0 - 40 IU/L  CBC With Differential     Status: None   Collection Time: 04/21/17 12:00 AM  Result Value Ref Range   WBC 5.6 3.4 - 10.8 x10E3/uL   RBC 4.55 3.77 - 5.28 x10E6/uL   Hemoglobin 12.3 11.1 - 15.9 g/dL   Hematocrit 38.6 34.0 - 46.6 %   MCV 85 79 - 97 fL   MCH 27.0 26.6 - 33.0 pg   MCHC 31.9 31.5 - 35.7 g/dL   RDW 13.7 12.3 - 15.4 %   Neutrophils 51 Not Estab. %   Lymphs 38 Not Estab. %   Monocytes 10 Not Estab. %   Eos 1 Not Estab. %   Basos 0 Not Estab. %   Neutrophils Absolute 2.9 1.4 - 7.0 x10E3/uL   Lymphocytes Absolute 2.2 0.7 - 3.1 x10E3/uL   Monocytes Absolute 0.5 0.1 - 0.9  x10E3/uL   EOS (ABSOLUTE) 0.1 0.0 - 0.4 x10E3/uL   Basophils Absolute 0.0 0.0 - 0.2 x10E3/uL   Immature Granulocytes 0 Not Estab. %   Immature Grans (Abs) 0.0 0.0 - 0.1 x10E3/uL  VITAMIN D  25 Hydroxy (Vit-D Deficiency, Fractures)     Status: Abnormal   Collection Time: 04/21/17 12:00 AM  Result Value Ref Range   Vit D, 25-Hydroxy 20.6 (L) 30.0 - 100.0 ng/mL    Comment: Vitamin D deficiency has been defined by the [place] practice guideline as a level of serum 25-OH vitamin D less than 20 ng/mL (1,2). The Endocrine Society went on to further define vitamin D insufficiency as a level between 21 and 29 ng/mL (2). 1. IOM (Institute of Medicine). 2010. Dietary reference    intakes for calcium and D. [place]: The    [employer]. 2. Holick MF, Binkley [place], Bischoff-Ferrari HA, et al.    Evaluation, treatment, and prevention of vitamin D    deficiency: an Endocrine Society clinical practice    guideline. JCEM. 2011 Jul; 96(7):1911-30.   Vitamin B12     Status: None   Collection Time: 04/21/17 12:00 AM  Result Value Ref Range   Vitamin B-12 384 232 - 1,245 pg/mL  Iron, TIBC and Ferritin Panel     Status: None   Collection Time: 04/21/17 12:00 AM  Result Value Ref Range   Total Iron Binding Capacity 318 250 - 450 ug/dL   UIBC 218 131 - 425 ug/dL   Iron 100 27 - 159 ug/dL   Iron Saturation 31 15 - 55 %   Ferritin 32 15 - 150 ng/mL     PHQ2/9: Depression screen [facility] 2/9 05/06/2017 04/07/2017 03/12/2016 02/15/2015  Decreased Interest 0 0 0 0  Down, Depressed, Hopeless 0 0 0 0  PHQ - 2 Score 0 0 0 0  Altered sleeping 0 - - -  Tired, decreased energy 1 - - -  Change in appetite 2 - - -  Feeling bad or failure about yourself  0 - - -  Trouble concentrating 1 - - -  Moving slowly or fidgety/restless 0 - - -  Suicidal thoughts 0 - - -  PHQ-9 Score 4 - - -  Difficult doing work/chores Not difficult at all - - -     Fall  Risk: Fall Risk  06/02/2017 04/07/2017 03/12/2016 02/15/2015  Falls in the past year? No No No No     Assessment & Plan  1. Chronic tension-type headache, not intractable  She is going to follow up with Dr. Melrose Nakayama soon, she is out of Nortriptyline, usually in am's and is supposed to have a sleep study soon.   2. Essential hypertension  bp is at goal today, however on school break and is a little less stressed, explained importance of getting urine micro and TSH done as advised, other labs reviewed and at goal   3. Goiter  Continue follow up with Dr. Tami Ribas

## 2017-07-24 ENCOUNTER — Ambulatory Visit: Payer: Commercial Managed Care - PPO | Attending: Otolaryngology

## 2017-07-24 DIAGNOSIS — G4733 Obstructive sleep apnea (adult) (pediatric): Secondary | ICD-10-CM | POA: Insufficient documentation

## 2017-08-04 ENCOUNTER — Ambulatory Visit: Payer: Commercial Managed Care - PPO | Admitting: Family Medicine

## 2017-08-04 ENCOUNTER — Ambulatory Visit: Payer: Commercial Managed Care - PPO | Attending: Otolaryngology

## 2017-08-04 ENCOUNTER — Encounter: Payer: Self-pay | Admitting: Family Medicine

## 2017-08-04 VITALS — BP 110/60 | HR 76 | Resp 14 | Ht 62.0 in | Wt 182.0 lb

## 2017-08-04 DIAGNOSIS — Z72820 Sleep deprivation: Secondary | ICD-10-CM | POA: Diagnosis not present

## 2017-08-04 DIAGNOSIS — G4733 Obstructive sleep apnea (adult) (pediatric): Secondary | ICD-10-CM

## 2017-08-04 NOTE — Progress Notes (Signed)
Name: [name]   MRN: [id number]    DOB: February 02, 1976   Date:08/04/2017       Progress Note  Subjective  Chief Complaint  Chief Complaint  Patient presents with  . Sleep Apnea    HPI  OSA: she is seeing Dr. Tami Ribas and had ESS of 19, sent for sleep study 07/28/2017 and found to have mild to moderate Sleep apnea with an AHI of 5.3. She is going back tonight for titration study. She states she has difficulty getting out of bed in am, does not feel rested and is getting late to work. She needs FMLA filled out. She states that she has been late 4 times in 2019 already. She works full time, goes to school and has 4 children still at home, explained that CPAP will not fix her problems if she does not give her enough time to sleep  Patient Active Problem List   Diagnosis Date Noted  . Blurred vision, bilateral 05/20/2017  . Chronic tension-type headache, not intractable 05/20/2017  . Essential hypertension 05/20/2017  . Intermittent low back pain 04/21/2017  . GERD (gastroesophageal reflux disease) 03/12/2016  . Goiter 03/12/2016  . MVP (mitral valve prolapse) 02/15/2015  . History of iron deficiency anemia 02/15/2015  . Vitamin D deficiency 01/11/2015  . Vitamin B12 deficiency 01/11/2015  . Heart murmur 12/29/2011  . Fibroid uterus 12/29/2011  . Family history of breast cancer 12/29/2011    Social History   Tobacco Use  . Smoking status: Never Smoker  . Smokeless tobacco: Never Used  Substance Use Topics  . Alcohol use: No     Current Outpatient Medications:  .  Cholecalciferol (VITAMIN D) 2000 units CAPS, Take by mouth., Disp: , Rfl:  .  Cyanocobalamin (B-12 PO), Take by mouth., Disp: , Rfl:  .  metoprolol succinate (TOPROL-XL) 25 MG 24 hr tablet, Take 1 tablet (25 mg total) by mouth daily., Disp: 30 tablet, Rfl: 3 .  nortriptyline (PAMELOR) 10 MG capsule, Take 1-2 capsules by mouth at bedtime., Disp: , Rfl:  .  PREVIDENT 5000 DRY MOUTH 1.1 % GEL dental gel, Take 1  each by mouth 2 (two) times daily., Disp: , Rfl: 0  No Known Allergies  ROS  Ten systems reviewed and is negative except as mentioned in HPI   Objective  Vitals:   08/04/17 0831  BP: 110/60  Pulse: 76  Resp: 14  SpO2: 98%  Weight: 182 lb (82.6 kg)  Height: 5\' 2"  (1.575 m)    Body mass index is 33.29 kg/m.    Physical Exam  Constitutional: Patient appears well-developed and well-nourished. Obese  No distress.  HEENT: head atraumatic, normocephalic, pupils equal and reactive to light, neck supple, throat within normal limits, goiter noticed  Cardiovascular: Normal rate, regular rhythm and normal heart sounds.  No murmur heard. No BLE edema. Pulmonary/Chest: Effort normal and breath sounds normal. No respiratory distress. Abdominal: Soft.  There is no tenderness. Psychiatric: Patient has a normal mood and affect. behavior is normal. Judgment and thought content normal.  Assessment & Plan  1. OSA (obstructive sleep apnea)  She is going back for CPAP study   2. Lack of adequate sleep  Discussed importance of adequate sleep ( enough hours at night) try to get in bed no later than 11:30 pm

## 2017-08-24 ENCOUNTER — Other Ambulatory Visit: Payer: Self-pay

## 2017-08-24 DIAGNOSIS — G44229 Chronic tension-type headache, not intractable: Secondary | ICD-10-CM

## 2017-08-24 DIAGNOSIS — I10 Essential (primary) hypertension: Secondary | ICD-10-CM

## 2017-08-24 MED ORDER — METOPROLOL SUCCINATE ER 25 MG PO TB24: 25.0000 mg | ORAL_TABLET | Freq: Every day | ORAL | 0 refills | Status: DC

## 2017-08-24 NOTE — Telephone Encounter (Signed)
Hypertension medication request: Metoprolol to [facility].   Last office visit pertaining to hypertension: 08/04/17-Please sent a 90 day supply instead of a 30 day due to cost.   BP Readings from Last 3 Encounters:  08/04/17 110/60  07/10/17 124/68  06/02/17 (!) 160/90    Lab Results  Component Value Date   CREATININE 0.89 04/21/2017   BUN 10 04/21/2017   NA 139 04/21/2017   K 4.2 04/21/2017   CL 105 04/21/2017   CO2 24 03/12/2016     Follow up on 11/10/2017

## 2017-11-10 ENCOUNTER — Ambulatory Visit: Payer: Commercial Managed Care - PPO | Admitting: Family Medicine

## 2017-11-17 ENCOUNTER — Encounter: Payer: Self-pay | Admitting: Family Medicine

## 2017-11-17 ENCOUNTER — Ambulatory Visit: Payer: Commercial Managed Care - PPO | Admitting: Family Medicine

## 2017-11-17 VITALS — BP 128/76 | HR 95 | Temp 98.7°F | Resp 16 | Ht 62.0 in | Wt 184.1 lb

## 2017-11-17 DIAGNOSIS — G4710 Hypersomnia, unspecified: Secondary | ICD-10-CM | POA: Diagnosis not present

## 2017-11-17 DIAGNOSIS — E538 Deficiency of other specified B group vitamins: Secondary | ICD-10-CM | POA: Diagnosis not present

## 2017-11-17 DIAGNOSIS — G44229 Chronic tension-type headache, not intractable: Secondary | ICD-10-CM

## 2017-11-17 DIAGNOSIS — E669 Obesity, unspecified: Secondary | ICD-10-CM

## 2017-11-17 DIAGNOSIS — G4733 Obstructive sleep apnea (adult) (pediatric): Secondary | ICD-10-CM | POA: Diagnosis not present

## 2017-11-17 DIAGNOSIS — E559 Vitamin D deficiency, unspecified: Secondary | ICD-10-CM | POA: Diagnosis not present

## 2017-11-17 DIAGNOSIS — I10 Essential (primary) hypertension: Secondary | ICD-10-CM | POA: Diagnosis not present

## 2017-11-17 DIAGNOSIS — G4730 Sleep apnea, unspecified: Secondary | ICD-10-CM

## 2017-11-17 MED ORDER — MODAFINIL 100 MG PO TABS: 100.0000 mg | ORAL_TABLET | Freq: Every day | ORAL | 0 refills | Status: DC

## 2017-11-17 NOTE — Progress Notes (Signed)
Name: [name]   MRN: [id number]    DOB: [birth year]   Date:11/18/2017       Progress Note  Subjective  Chief Complaint  Chief Complaint  Patient presents with  . Medication Refill  . Hypertension    States she is having headaches, bilateral edema and occasionally palpations-forget to take her BP medication daily  . Sleep Apnea    Has CPAP but is still sleepy at times   . Headache    Unchanged    HPI   OSA: she is seeing Dr. Tami Ribas and had ESS of 19, sent for sleep study 07/28/2017 and found to have mild to moderate Sleep apnea with an AHI of 5.3, on CPAP since March 2019, wearing it every night, but continues to feel tired during the day.  She is going to bed at 11 pm and getting up at 6 am. She works full time, goes to school and has 4 children still at home,  HTN: BP is well controlled right now, she has intermittent chest pain, described as dull aching pain on the left side of her chest , that lasts at most 2 minutes, it can be at rest, no diaphoresis, nausea or vomiting, or SOB.   HTN: doing slightly better, episodes not as often. Once or twice a week   Obesity: discussed importance of weight loss because of co morbidities , she will try to eat breakfast and exercise more    Patient Active Problem List   Diagnosis Date Noted  . OSA (obstructive sleep apnea) 11/17/2017  . Hypersomnia with sleep apnea 11/17/2017  . Chronic tension-type headache, not intractable 05/20/2017  . Essential hypertension 05/20/2017  . Intermittent low back pain 04/21/2017  . GERD (gastroesophageal reflux disease) 03/12/2016  . Goiter 03/12/2016  . MVP (mitral valve prolapse) 02/15/2015  . History of iron deficiency anemia 02/15/2015  . Vitamin D deficiency 01/11/2015  . Vitamin B12 deficiency 01/11/2015  . Heart murmur 12/29/2011  . Fibroid uterus 12/29/2011  . Family history of breast cancer 12/29/2011    Past Surgical History:  Procedure Laterality Date  . LEEP    . WISDOM  TOOTH EXTRACTION     x4    Family History  Problem Relation Age of Onset  . Cancer Mother 17       deceased at age 44 (breast)  . Diabetes Mother   . Hypertension Mother   . Breast cancer Mother 17  . Cancer Maternal Aunt 32       breast  . Breast cancer Maternal Aunt 31  . Cancer Maternal Grandmother 70       pancreatic  . Cancer Maternal Grandfather 67       throat  . Hypertension Father   . Cancer Father 80       prostate  . Hypertension Brother   . Heart disease Paternal Grandmother        heart attack  . Heart disease Paternal Grandfather        heart attack.    Social History   Socioeconomic History  . Marital status: Married    Spouse name: Not on file  . Number of children: Not on file  . Years of education: Not on file  . Highest education level: Not on file  Occupational History  . Not on file  Social Needs  . Financial resource strain: Not on file  . Food insecurity:    Worry: Not on file    Inability: Not on file  .  Transportation needs:    Medical: Not on file    Non-medical: Not on file  Tobacco Use  . Smoking status: Never Smoker  . Smokeless tobacco: Never Used  Substance and Sexual Activity  . Alcohol use: No  . Drug use: No  . Sexual activity: Yes    Partners: Male    Birth control/protection: Surgical    Comment: vasectomy  Lifestyle  . Physical activity:    Days per week: Not on file    Minutes per session: Not on file  . Stress: Not on file  Relationships  . Social connections:    Talks on phone: Not on file    Gets together: Not on file    Attends religious service: Not on file    Active member of club or organization: Not on file    Attends meetings of clubs or organizations: Not on file    Relationship status: Not on file  . Intimate partner violence:    Fear of current or ex partner: Not on file    Emotionally abused: Not on file    Physically abused: Not on file    Forced sexual activity: Not on file  Other Topics  Concern  . Not on file  Social History Narrative   Married, 5 children, one is grown and out of the house.    Youngest born 2008.   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  fluticasone (FLONASE) 50 MCG/ACT nasal spray, Place 1 spray into both nostrils as needed., Disp: , Rfl: 12 .  metoprolol succinate (TOPROL-XL) 25 MG 24 hr tablet, Take 1 tablet (25 mg total) by mouth daily., Disp: 90 tablet, Rfl: 0 .  PREVIDENT 5000 DRY MOUTH 1.1 % GEL dental gel, Take 1 each by mouth 2 (two) times daily., Disp: , Rfl: 0 .  Cholecalciferol (VITAMIN D) 2000 units CAPS, Take by mouth., Disp: , Rfl:  .  Cyanocobalamin (B-12 PO), Take by mouth., Disp: , Rfl:  .  modafinil (PROVIGIL) 100 MG tablet, Take 1 tablet (100 mg total) by mouth daily., Disp: 30 tablet, Rfl: 0 .  nortriptyline (PAMELOR) 10 MG capsule, Take 1-2 capsules by mouth at bedtime., Disp: , Rfl:   No Known Allergies   ROS  Constitutional: Negative for fever or weight change.  Respiratory: Negative for cough and shortness of breath.   Cardiovascular: Negative for chest pain or palpitations.  Gastrointestinal: Negative for abdominal pain, no bowel changes.  Musculoskeletal: Negative for gait problem or joint swelling.  Skin: Negative for rash.  Neurological: Negative for dizziness, positive for headache.  No other specific complaints in a complete review of systems (except as listed in HPI above).  Objective  Vitals:   11/17/17 1421  BP: 128/76  Pulse: 95  Resp: 16  Temp: 98.7 F (37.1 C)  TempSrc: Oral  SpO2: 97%  Weight: 184 lb 1.6 oz (83.5 kg)  Height: 5\' 2"  (1.575 m)    Body mass index is 33.67 kg/m.  Physical Exam  Constitutional: Patient appears well-developed and well-nourished. Obese  No distress.  HEENT: head atraumatic, normocephalic, pupils equal and reactive to light,neck supple, throat within normal  limits Cardiovascular: Normal rate, regular rhythm and normal heart sounds.  No murmur heard. No BLE edema. Pulmonary/Chest: Effort normal and breath sounds normal. No respiratory distress. Abdominal: Soft.  There is no tenderness. Psychiatric: Patient has a  normal mood and affect. behavior is normal. Judgment and thought content normal.  PHQ2/9: Depression screen [facility] 2/9 11/17/2017 05/06/2017 04/07/2017 03/12/2016 02/15/2015  Decreased Interest 0 0 0 0 0  Down, Depressed, Hopeless 0 0 0 0 0  PHQ - 2 Score 0 0 0 0 0  Altered sleeping 0 0 - - -  Tired, decreased energy 1 1 - - -  Change in appetite 0 2 - - -  Feeling bad or failure about yourself  0 0 - - -  Trouble concentrating 0 1 - - -  Moving slowly or fidgety/restless 0 0 - - -  Suicidal thoughts 0 0 - - -  PHQ-9 Score 1 4 - - -  Difficult doing work/chores Somewhat difficult Not difficult at all - - -     Fall Risk: Fall Risk  11/17/2017 08/04/2017 06/02/2017 04/07/2017 03/12/2016  Falls in the past year? No No No No No     Functional Status Survey: Is the patient deaf or have difficulty hearing?: No Does the patient have difficulty seeing, even when wearing glasses/contacts?: Yes Does the patient have difficulty concentrating, remembering, or making decisions?: No Does the patient have difficulty walking or climbing stairs?: No Does the patient have difficulty dressing or bathing?: No Does the patient have difficulty doing errands alone such as visiting a doctor's office or shopping?: No   Assessment & Plan  1. OSA (obstructive sleep apnea)  Wearing every night, still has daily fatigue, and headaches a few times a week, but not as bad, we will try provigil   2. Hypersomnia with sleep apnea  - modafinil (PROVIGIL) 100 MG tablet; Take 1 tablet (100 mg total) by mouth daily.  Dispense: 30 tablet; Refill: 0  3. Vitamin D deficiency  Take supplement   4. Vitamin B12 deficiency  Take supplement   5. Essential  hypertension  - TSH - Urine Microalbumin w/creat. ratio  6. Chronic tension-type headache, not intractable  Taking prn medication   7. Obesity (BMI 30.0-34.9)  She will start eating breakfast, currently BMI is not of morbid obesity, however she has OSA and also HTN

## 2017-11-24 ENCOUNTER — Other Ambulatory Visit: Payer: Self-pay | Admitting: Family Medicine

## 2017-11-24 DIAGNOSIS — I10 Essential (primary) hypertension: Secondary | ICD-10-CM

## 2017-11-24 DIAGNOSIS — G44229 Chronic tension-type headache, not intractable: Secondary | ICD-10-CM

## 2017-11-24 NOTE — Telephone Encounter (Signed)
Refill request for Hypertension medication: Metoprolol 25 mg  Last office visit pertaining to hypertension: 11/17/17  BP Readings from Last 3 Encounters:  11/17/17 128/76  08/04/17 110/60  07/10/17 124/68     Lab Results  Component Value Date   CREATININE 0.89 04/21/2017   BUN 10 04/21/2017   NA 139 04/21/2017   K 4.2 04/21/2017   CL 105 04/21/2017   CO2 24 03/12/2016     Follow-ups on file. 03/22/18

## 2018-01-20 ENCOUNTER — Encounter: Payer: Self-pay | Admitting: Radiology

## 2018-03-22 ENCOUNTER — Ambulatory Visit: Payer: Commercial Managed Care - PPO | Admitting: Family Medicine

## 2018-03-25 ENCOUNTER — Telehealth: Payer: Self-pay

## 2018-03-25 NOTE — Telephone Encounter (Signed)
Called patient to verify if patient has ever seen another heart doctor.  No answer and mailbox was full.

## 2018-04-06 ENCOUNTER — Ambulatory Visit: Payer: Commercial Managed Care - PPO | Admitting: Cardiovascular Disease

## 2018-04-06 ENCOUNTER — Encounter

## 2018-04-09 ENCOUNTER — Encounter: Payer: Self-pay | Admitting: Family Medicine

## 2018-04-09 ENCOUNTER — Ambulatory Visit (INDEPENDENT_AMBULATORY_CARE_PROVIDER_SITE_OTHER): Payer: Commercial Managed Care - PPO | Admitting: Family Medicine

## 2018-04-09 VITALS — BP 132/74 | HR 86 | Temp 98.4°F | Resp 16 | Ht 62.0 in | Wt 188.5 lb

## 2018-04-09 DIAGNOSIS — I10 Essential (primary) hypertension: Secondary | ICD-10-CM | POA: Diagnosis not present

## 2018-04-09 DIAGNOSIS — E049 Nontoxic goiter, unspecified: Secondary | ICD-10-CM

## 2018-04-09 DIAGNOSIS — Z1159 Encounter for screening for other viral diseases: Secondary | ICD-10-CM

## 2018-04-09 DIAGNOSIS — E538 Deficiency of other specified B group vitamins: Secondary | ICD-10-CM

## 2018-04-09 DIAGNOSIS — G4733 Obstructive sleep apnea (adult) (pediatric): Secondary | ICD-10-CM

## 2018-04-09 DIAGNOSIS — J302 Other seasonal allergic rhinitis: Secondary | ICD-10-CM

## 2018-04-09 DIAGNOSIS — E669 Obesity, unspecified: Secondary | ICD-10-CM

## 2018-04-09 DIAGNOSIS — I341 Nonrheumatic mitral (valve) prolapse: Secondary | ICD-10-CM

## 2018-04-09 DIAGNOSIS — G44229 Chronic tension-type headache, not intractable: Secondary | ICD-10-CM

## 2018-04-09 DIAGNOSIS — Z23 Encounter for immunization: Secondary | ICD-10-CM | POA: Diagnosis not present

## 2018-04-09 DIAGNOSIS — G4710 Hypersomnia, unspecified: Secondary | ICD-10-CM

## 2018-04-09 DIAGNOSIS — Z131 Encounter for screening for diabetes mellitus: Secondary | ICD-10-CM

## 2018-04-09 DIAGNOSIS — Z01419 Encounter for gynecological examination (general) (routine) without abnormal findings: Secondary | ICD-10-CM

## 2018-04-09 DIAGNOSIS — E559 Vitamin D deficiency, unspecified: Secondary | ICD-10-CM

## 2018-04-09 DIAGNOSIS — G4730 Sleep apnea, unspecified: Secondary | ICD-10-CM

## 2018-04-09 DIAGNOSIS — E785 Hyperlipidemia, unspecified: Secondary | ICD-10-CM

## 2018-04-09 MED ORDER — METOPROLOL SUCCINATE ER 25 MG PO TB24: 25.0000 mg | ORAL_TABLET | Freq: Every day | ORAL | 1 refills | Status: DC

## 2018-04-09 MED ORDER — FLUTICASONE PROPIONATE 50 MCG/ACT NA SUSP: 1.0000 | NASAL | 1 refills | Status: DC | PRN

## 2018-04-09 NOTE — Patient Instructions (Signed)
Preventive Care 40-64 Years, Female Preventive care refers to lifestyle choices and visits with your health care provider that can promote health and wellness. What does preventive care include?  A yearly physical exam. This is also called an annual well check.  Dental exams once or twice a year.  Routine eye exams. Ask your health care provider how often you should have your eyes checked.  Personal lifestyle choices, including: ? Daily care of your teeth and gums. ? Regular physical activity. ? Eating a healthy diet. ? Avoiding tobacco and drug use. ? Limiting alcohol use. ? Practicing safe sex. ? Taking low-dose aspirin daily starting at age 58. ? Taking vitamin and mineral supplements as recommended by your health care provider. What happens during an annual well check? The services and screenings done by your health care provider during your annual well check will depend on your age, overall health, lifestyle risk factors, and family history of disease. Counseling Your health care provider may ask you questions about your:  Alcohol use.  Tobacco use.  Drug use.  Emotional well-being.  Home and relationship well-being.  Sexual activity.  Eating habits.  Work and work Statistician.  Method of birth control.  Menstrual cycle.  Pregnancy history.  Screening You may have the following tests or measurements:  Height, weight, and BMI.  Blood pressure.  Lipid and cholesterol levels. These may be checked every 5 years, or more frequently if you are over 81 years old.  Skin check.  Lung cancer screening. You may have this screening every year starting at age 78 if you have a 30-pack-year history of smoking and currently smoke or have quit within the past 15 years.  Fecal occult blood test (FOBT) of the stool. You may have this test every year starting at age 65.  Flexible sigmoidoscopy or colonoscopy. You may have a sigmoidoscopy every 5 years or a colonoscopy  every 10 years starting at age 30.  Hepatitis C blood test.  Hepatitis B blood test.  Sexually transmitted disease (STD) testing.  Diabetes screening. This is done by checking your blood sugar (glucose) after you have not eaten for a while (fasting). You may have this done every 1-3 years.  Mammogram. This may be done every 1-2 years. Talk to your health care provider about when you should start having regular mammograms. This may depend on whether you have a family history of breast cancer.  BRCA-related cancer screening. This may be done if you have a family history of breast, ovarian, tubal, or peritoneal cancers.  Pelvic exam and Pap test. This may be done every 3 years starting at age 80. Starting at age 36, this may be done every 5 years if you have a Pap test in combination with an HPV test.  Bone density scan. This is done to screen for osteoporosis. You may have this scan if you are at high risk for osteoporosis.  Discuss your test results, treatment options, and if necessary, the need for more tests with your health care provider. Vaccines Your health care provider may recommend certain vaccines, such as:  Influenza vaccine. This is recommended every year.  Tetanus, diphtheria, and acellular pertussis (Tdap, Td) vaccine. You may need a Td booster every 10 years.  Varicella vaccine. You may need this if you have not been vaccinated.  Zoster vaccine. You may need this after age 5.  Measles, mumps, and rubella (MMR) vaccine. You may need at least one dose of MMR if you were born in  1957 or later. You may also need a second dose.  Pneumococcal 13-valent conjugate (PCV13) vaccine. You may need this if you have certain conditions and were not previously vaccinated.  Pneumococcal polysaccharide (PPSV23) vaccine. You may need one or two doses if you smoke cigarettes or if you have certain conditions.  Meningococcal vaccine. You may need this if you have certain  conditions.  Hepatitis A vaccine. You may need this if you have certain conditions or if you travel or work in places where you may be exposed to hepatitis A.  Hepatitis B vaccine. You may need this if you have certain conditions or if you travel or work in places where you may be exposed to hepatitis B.  Haemophilus influenzae type b (Hib) vaccine. You may need this if you have certain conditions.  Talk to your health care provider about which screenings and vaccines you need and how often you need them. This information is not intended to replace advice given to you by your health care provider. Make sure you discuss any questions you have with your health care provider. Document Released: 07/20/2015 Document Revised: 03/12/2016 Document Reviewed: 04/24/2015 Elsevier Interactive Patient Education  2018 [employer]  

## 2018-04-09 NOTE — Progress Notes (Signed)
Name: [name]   MRN: [id number]    DOB: 07/10/1975   Date:04/09/2018       Progress Note  Subjective  Chief Complaint  Chief Complaint  Patient presents with  . Annual Exam    Goes to GYN for Pap and Mammograms    HPI   Patient presents for annual CPE and follow up  Diet: skipping meals, and drinking juice and sweet tea, she joined weight watchers, started a couple of weeks ago  OSA: she is seeing Dr. Tami Ribas and had ESS of 19, sent for sleep study 07/28/2017 and found to have mild to moderate Sleep apnea with an AHI of 5.3, on CPAP since March 2019, wearing it every night, but continues to feel tired during the day, we tried Provigil however she states she did not like the way it made her feel and stopped medication.  She is going to bed at 11 pm and getting up at 6 am. She works full time, goes to school and has 4 children still at home  HTN: BP is well controlled right now, she has intermittent chest pain but not as often since she started metoprolol. She  described as dull aching pain on the left side of her chest , that lasts at most 2 minutes, it can be at rest, no diaphoresis, nausea or vomiting, or SOB. She was seen by cardiologist and diagnosed with MVP and is doing well since   Obesity: discussed importance of weight loss because of co morbidities , she will try to eat breakfast and exercise more, going to weight watchers but not consistently logging calories   Depression:  Depression screen [facility] 2/9 04/09/2018 11/17/2017 05/06/2017 04/07/2017 03/12/2016  Decreased Interest 0 0 0 0 0  Down, Depressed, Hopeless 0 0 0 0 0  PHQ - 2 Score 0 0 0 0 0  Altered sleeping 0 0 0 - -  Tired, decreased energy 0 1 1 - -  Change in appetite 0 0 2 - -  Feeling bad or failure about yourself  0 0 0 - -  Trouble concentrating 0 0 1 - -  Moving slowly or fidgety/restless 0 0 0 - -  Suicidal thoughts 0 0 0 - -  PHQ-9 Score 0 1 4 - -  Difficult doing work/chores Not difficult at  all Somewhat difficult Not difficult at all - -   Hypertension: BP Readings from Last 3 Encounters:  04/09/18 132/74  11/17/17 128/76  08/04/17 110/60   Obesity: Wt Readings from Last 3 Encounters:  04/09/18 188 lb 8 oz (85.5 kg)  11/17/17 184 lb 1.6 oz (83.5 kg)  08/04/17 182 lb (82.6 kg)   BMI Readings from Last 3 Encounters:  04/09/18 34.48 kg/m  11/17/17 33.67 kg/m  08/04/17 33.29 kg/m    Hep C Screening: today  STD testing and prevention (HIV/chl/gon/syphilis): N/A Intimate partner violence: negative  Sexual History/Pain during Intercourse: no pain Menstrual History/LMP/Abnormal Bleeding: cycles are regular, no significant cramping  Incontinence Symptoms: occasional stress incontinence, discussed kegel exercises   Advanced Care Planning: A voluntary discussion about advance care planning including the explanation and discussion of advance directives.  Discussed health care proxy and Living will, and the patient was able to identify a health care proxy as husband   Patient does not have a living will at present time. If patient does have living will, I have requested they bring this to the clinic to be scanned in to their chart.  Breast cancer:  HM Mammogram  Date Value Ref Range Status  05/24/2014 Normal  Final    BRCA gene screening: mother and maternal aunt died of breast cancer, she had genetic testing done by gyn and was negative  Cervical cancer screening: up to date   Lipids:  Lab Results  Component Value Date   CHOL 169 04/21/2017   CHOL 213 (H) 03/12/2016   CHOL 188 01/09/2015   Lab Results  Component Value Date   HDL 50 04/21/2017   HDL 53 03/12/2016   HDL 53 01/09/2015   Lab Results  Component Value Date   LDLCALC 99 04/21/2017   LDLCALC 134 (H) 03/12/2016   LDLCALC 108 (H) 01/09/2015   Lab Results  Component Value Date   TRIG 98 04/21/2017   TRIG 128 03/12/2016   TRIG 136 01/09/2015   Lab Results  Component Value Date   CHOLHDL 3.4  04/21/2017   CHOLHDL 4.0 03/12/2016   CHOLHDL 3.5 01/09/2015   No results found for: LDLDIRECT  Glucose:  Glucose  Date Value Ref Range Status  04/21/2017 83 65 - 99 mg/dL Final  03/12/2016 88 65 - 99 mg/dL Final    Skin cancer: discussed atypical lesions  Colorectal cancer: she will call insurance to find out if covered based on her race Lung cancer:  Low Dose CT Chest recommended if Age 54-80 years, 30 pack-year currently smoking OR have quit w/in 15years. Patient does not qualify.   ECG up to date  Patient Active Problem List   Diagnosis Date Noted  . OSA (obstructive sleep apnea) 11/17/2017  . Hypersomnia with sleep apnea 11/17/2017  . Chronic tension-type headache, not intractable 05/20/2017  . Essential hypertension 05/20/2017  . Intermittent low back pain 04/21/2017  . GERD (gastroesophageal reflux disease) 03/12/2016  . Goiter 03/12/2016  . MVP (mitral valve prolapse) 02/15/2015  . History of iron deficiency anemia 02/15/2015  . Vitamin D deficiency 01/11/2015  . Vitamin B12 deficiency 01/11/2015  . Heart murmur 12/29/2011  . Fibroid uterus 12/29/2011  . Family history of breast cancer 12/29/2011    Past Surgical History:  Procedure Laterality Date  . LEEP    . WISDOM TOOTH EXTRACTION     x4    Family History  Problem Relation Age of Onset  . Cancer Mother 26       deceased at age 44 (breast)  . Diabetes Mother   . Hypertension Mother   . Breast cancer Mother 53  . Cancer Maternal Aunt 32       breast  . Breast cancer Maternal Aunt 31  . Cancer Maternal Grandmother 70       pancreatic  . Cancer Maternal Grandfather 67       throat  . Hypertension Father   . Cancer Father 43       prostate  . Hypertension Brother   . Heart disease Paternal Grandmother        heart attack  . Heart disease Paternal Grandfather        heart attack.    Social History   Socioeconomic History  . Marital status: Married    Spouse name: Erick  . Number of  children: 5  . Years of education: Not on file  . Highest education level: Not on file  Occupational History  . Not on file  Social Needs  . Financial resource strain: Not hard at all  . Food insecurity:    Worry: Never true    Inability: Never true  .  Transportation needs:    Medical: No    Non-medical: No  Tobacco Use  . Smoking status: Never Smoker  . Smokeless tobacco: Never Used  Substance and Sexual Activity  . Alcohol use: No  . Drug use: No  . Sexual activity: Yes    Partners: Male    Birth control/protection: Surgical    Comment: vasectomy  Lifestyle  . Physical activity:    Days per week: 0 days    Minutes per session: 0 min  . Stress: Not at all  Relationships  . Social connections:    Talks on phone: More than three times a week    Gets together: Twice a week    Attends religious service: Never    Active member of club or organization: No    Attends meetings of clubs or organizations: Never    Relationship status: Married  . Intimate partner violence:    Fear of current or ex partner: No    Emotionally abused: No    Physically abused: No    Forced sexual activity: No  Other Topics Concern  . Not on file  Social History Narrative   Married, 5 children, one is grown and out of the house.    Youngest born 2008.   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  Cholecalciferol (VITAMIN D) 2000 units CAPS, Take by mouth., Disp: , Rfl:  .  Cyanocobalamin (B-12 PO), Take by mouth., Disp: , Rfl:  .  fluticasone (FLONASE) 50 MCG/ACT nasal spray, Place 1 spray into both nostrils as needed., Disp: 48 g, Rfl: 1 .  metoprolol succinate (TOPROL-XL) 25 MG 24 hr tablet, Take 1 tablet (25 mg total) by mouth daily., Disp: 90 tablet, Rfl: 1  No Known Allergies   ROS  Constitutional: Negative for fever or significant weight change.  Respiratory: Negative for cough and  shortness of breath.   Cardiovascular: Negative for chest pain or palpitations.  Gastrointestinal: Negative for abdominal pain, no bowel changes.  Musculoskeletal: Negative for gait problem or joint swelling.  Skin: Negative for rash.  Neurological: Negative for dizziness or headache.  No other specific complaints in a complete review of systems (except as listed in HPI above).  Objective  Vitals:   04/09/18 1028  BP: 132/74  Pulse: 86  Resp: 16  Temp: 98.4 F (36.9 C)  TempSrc: Oral  SpO2: 95%  Weight: 188 lb 8 oz (85.5 kg)  Height: 5' 2"  (1.575 m)    Body mass index is 34.48 kg/m.  Physical Exam  Constitutional: Patient appears well-developed and well-nourished. Obese.No distress.  HENT: Head: Normocephalic and atraumatic. Ears: B TMs ok, no erythema or effusion; Nose: Nose normal. Mouth/Throat: Oropharynx is clear and moist. No oropharyngeal exudate.  Eyes: Conjunctivae and EOM are normal. Pupils are equal, round, and reactive to light. No scleral icterus.  Neck: Normal range of motion. Neck supple. No JVD present. No thyromegaly present.  Cardiovascular: Normal rate, regular rhythm and normal heart sounds.  No murmur heard. No BLE edema. Pulmonary/Chest: Effort normal and breath sounds normal. No respiratory distress. Abdominal: Soft. Bowel sounds are normal, no distension. There is no tenderness. no masses Breast: no lumps or masses, no nipple discharge or rashes FEMALE GENITALIA:  Not done RECTAL: not done Musculoskeletal: Normal range of motion, no joint effusions. No gross deformities Neurological: he is alert and oriented to person, place,  and time. No cranial nerve deficit. Coordination, balance, strength, speech and gait are normal.  Skin: Skin is warm and dry. No rash noted. No erythema.  Psychiatric: Patient has a normal mood and affect. behavior is normal. Judgment and thought content normal.   PHQ2/9: Depression screen [facility] 2/9 04/09/2018 11/17/2017  05/06/2017 04/07/2017 03/12/2016  Decreased Interest 0 0 0 0 0  Down, Depressed, Hopeless 0 0 0 0 0  PHQ - 2 Score 0 0 0 0 0  Altered sleeping 0 0 0 - -  Tired, decreased energy 0 1 1 - -  Change in appetite 0 0 2 - -  Feeling bad or failure about yourself  0 0 0 - -  Trouble concentrating 0 0 1 - -  Moving slowly or fidgety/restless 0 0 0 - -  Suicidal thoughts 0 0 0 - -  PHQ-9 Score 0 1 4 - -  Difficult doing work/chores Not difficult at all Somewhat difficult Not difficult at all - -     Fall Risk: Fall Risk  04/09/2018 11/17/2017 08/04/2017 06/02/2017 04/07/2017  Falls in the past year? No No No No No     Functional Status Survey: Is the patient deaf or have difficulty hearing?: No Does the patient have difficulty seeing, even when wearing glasses/contacts?: Yes(glasses) Does the patient have difficulty concentrating, remembering, or making decisions?: No Does the patient have difficulty walking or climbing stairs?: No Does the patient have difficulty dressing or bathing?: No Does the patient have difficulty doing errands alone such as visiting a doctor's office or shopping?: No   Assessment & Plan  1. Well woman exam   2. Need for immunization against influenza  - Flu Vaccine QUAD 6+ mos PF IM (Fluarix Quad PF)  3. Chronic tension-type headache, not intractable  Stopped P[place]or but doing well on metoprolol  - metoprolol succinate (TOPROL-XL) 25 MG 24 hr tablet; Take 1 tablet (25 mg total) by mouth daily.  Dispense: 90 tablet; Refill: 1  4. Essential hypertension  - metoprolol succinate (TOPROL-XL) 25 MG 24 hr tablet; Take 1 tablet (25 mg total) by mouth daily.  Dispense: 90 tablet; Refill: 1 - CBC with Differential/Platelet - Comprehensive metabolic panel - CBC with Differential/Platelet  5. Obesity (BMI 30.0-34.9)  Discussed with the patient the risk posed by an increased BMI. Discussed importance of portion control, calorie counting and at least 150 minutes of  physical activity weekly. Avoid sweet beverages and drink more water. Eat at least 6 servings of fruit and vegetables daily   6. Vitamin D deficiency  - VITAMIN D 25 Hydroxy (Vit-D Deficiency, Fractures)  7. Vitamin B12 deficiency  - Vitamin B12  8. Hypersomnia with sleep apnea  Did not like Provigil and stopped on her own   77. OSA (obstructive sleep apnea)   10. Dyslipidemia  - Lipid panel  11. MVP (mitral valve prolapse)   12. Seasonal allergic rhinitis, unspecified trigger  - fluticasone (FLONASE) 50 MCG/ACT nasal spray; Place 1 spray into both nostrils as needed.  Dispense: 48 g; Refill: 1  13. Goiter  - TSH  14. Diabetes mellitus screening  - Hemoglobin A1c  15. Need for hepatitis C screening test  - Hepatitis C Antibody   -USPSTF grade A and B recommendations reviewed with patient; age-appropriate recommendations, preventive care, screening tests, etc discussed and encouraged; healthy living encouraged; see AVS for patient education given to patient -Discussed importance of 150 minutes of physical activity weekly, eat two servings of fish weekly, eat  one serving of tree nuts ( cashews, pistachios, pecans, almonds.Marland Kitchen) every other day, eat 6 servings of fruit/vegetables daily and drink plenty of water and avoid sweet beverages.

## 2018-04-10 LAB — CBC WITH DIFFERENTIAL/PLATELET
Basophils Absolute: 0 10*3/uL (ref 0.0–0.2)
Basos: 0 %
EOS (ABSOLUTE): 0.1 10*3/uL (ref 0.0–0.4)
Eos: 1 %
Hematocrit: 38.5 % (ref 34.0–46.6)
Hemoglobin: 12.5 g/dL (ref 11.1–15.9)
Immature Grans (Abs): 0 10*3/uL (ref 0.0–0.1)
Immature Granulocytes: 1 %
Lymphocytes Absolute: 2.5 10*3/uL (ref 0.7–3.1)
Lymphs: 43 %
MCH: 27.6 pg (ref 26.6–33.0)
MCHC: 32.5 g/dL (ref 31.5–35.7)
MCV: 85 fL (ref 79–97)
Monocytes Absolute: 0.6 10*3/uL (ref 0.1–0.9)
Monocytes: 10 %
Neutrophils Absolute: 2.7 10*3/uL (ref 1.4–7.0)
Neutrophils: 45 %
Platelets: 370 10*3/uL (ref 150–450)
RBC: 4.53 x10E6/uL (ref 3.77–5.28)
RDW: 12.3 % (ref 12.3–15.4)
WBC: 5.9 10*3/uL (ref 3.4–10.8)

## 2018-04-10 LAB — LIPID PANEL
Chol/HDL Ratio: 4 ratio (ref 0.0–4.4)
Cholesterol, Total: 193 mg/dL (ref 100–199)
HDL: 48 mg/dL (ref >39)
LDL Calculated: 107 mg/dL — ABNORMAL HIGH (ref 0–99)
Triglycerides: 191 mg/dL — ABNORMAL HIGH (ref 0–149)
VLDL Cholesterol Cal: 38 mg/dL (ref 5–40)

## 2018-04-10 LAB — COMPREHENSIVE METABOLIC PANEL
ALT: 32 IU/L (ref 0–32)
AST: 29 IU/L (ref 0–40)
Albumin/Globulin Ratio: 1.6 (ref 1.2–2.2)
Albumin: 4.5 g/dL (ref 3.5–5.5)
Alkaline Phosphatase: 81 IU/L (ref 39–117)
BUN/Creatinine Ratio: 13 (ref 9–23)
BUN: 10 mg/dL (ref 6–24)
Bilirubin Total: 0.6 mg/dL (ref 0.0–1.2)
CO2: 19 mmol/L — ABNORMAL LOW (ref 20–29)
Calcium: 9.4 mg/dL (ref 8.7–10.2)
Chloride: 104 mmol/L (ref 96–106)
Creatinine, Ser: 0.78 mg/dL (ref 0.57–1.00)
GFR calc Af Amer: 107 mL/min/{1.73_m2} (ref >59)
GFR calc non Af Amer: 93 mL/min/{1.73_m2} (ref >59)
Globulin, Total: 2.9 g/dL (ref 1.5–4.5)
Glucose: 75 mg/dL (ref 65–99)
Potassium: 4.6 mmol/L (ref 3.5–5.2)
Sodium: 142 mmol/L (ref 134–144)
Total Protein: 7.4 g/dL (ref 6.0–8.5)

## 2018-04-10 LAB — HEMOGLOBIN A1C
Est. average glucose Bld gHb Est-mCnc: 100 mg/dL
Hgb A1c MFr Bld: 5.1 % (ref 4.8–5.6)

## 2018-04-10 LAB — HEPATITIS C ANTIBODY: Hep C Virus Ab: <0.1 s/co ratio (ref 0.0–0.9)

## 2018-04-10 LAB — VITAMIN D 25 HYDROXY (VIT D DEFICIENCY, FRACTURES): Vit D, 25-Hydroxy: 16.3 ng/mL — ABNORMAL LOW (ref 30.0–100.0)

## 2018-04-10 LAB — TSH: TSH: 3.7 u[IU]/mL (ref 0.450–4.500)

## 2018-04-10 LAB — VITAMIN B12: Vitamin B-12: 407 pg/mL (ref 232–1245)

## 2018-04-11 ENCOUNTER — Other Ambulatory Visit: Payer: Self-pay | Admitting: Family Medicine

## 2018-04-11 MED ORDER — VITAMIN D (ERGOCALCIFEROL) 1.25 MG (50000 UNIT) PO CAPS: 50000.0000 [IU] | ORAL_CAPSULE | ORAL | 0 refills | Status: DC

## 2018-05-18 ENCOUNTER — Ambulatory Visit: Payer: Commercial Managed Care - PPO | Admitting: Family Medicine

## 2018-05-18 DIAGNOSIS — Z01419 Encounter for gynecological examination (general) (routine) without abnormal findings: Secondary | ICD-10-CM

## 2018-06-11 IMAGING — MG MM DIGITAL SCREENING BILAT W/ CAD
5 series · 5 of 5 positions shown · non-contrast
Comparison: Previous exam(s).

CLINICAL DATA: Screening.

EXAM:
DIGITAL SCREENING BILATERAL MAMMOGRAM WITH CAD

[R MLO (1 of 2)]
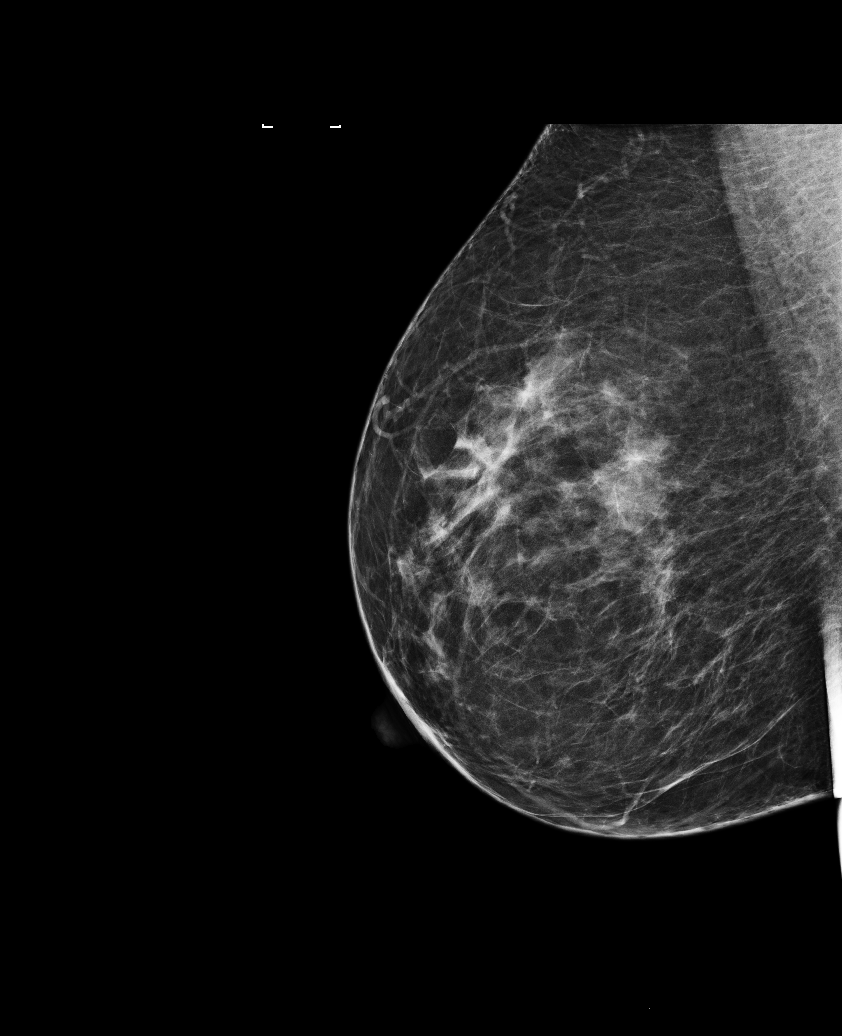

[R CC]
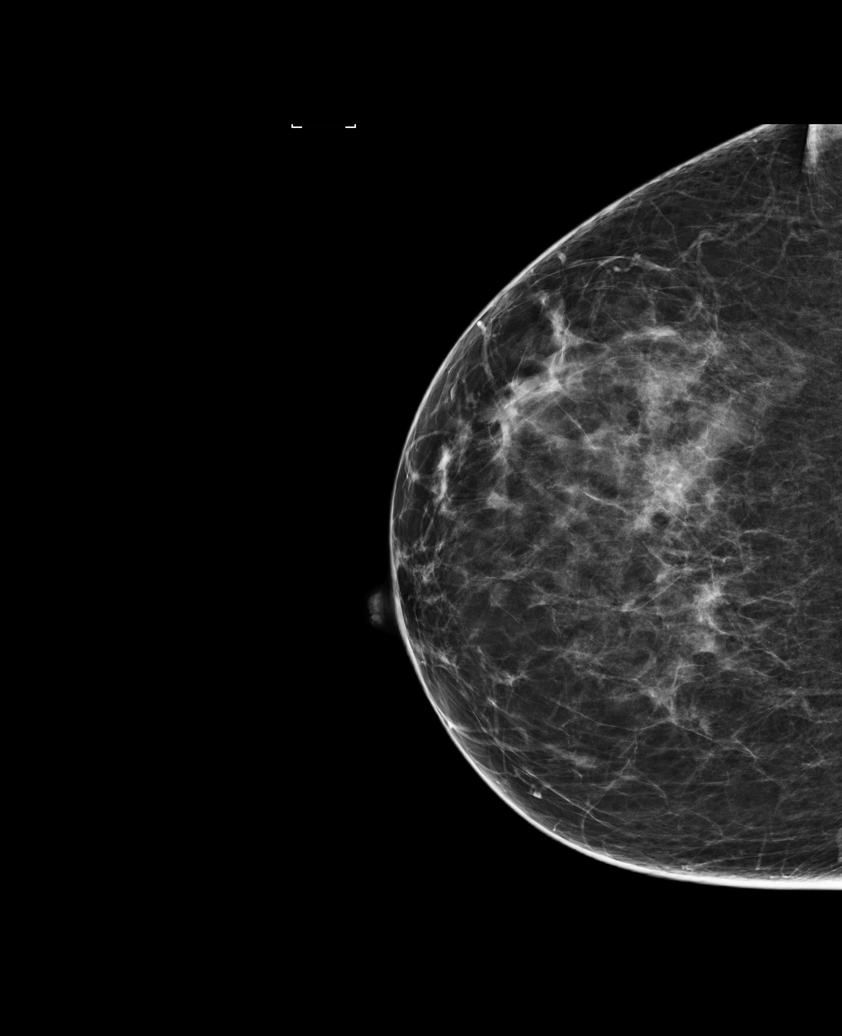

[L CC]
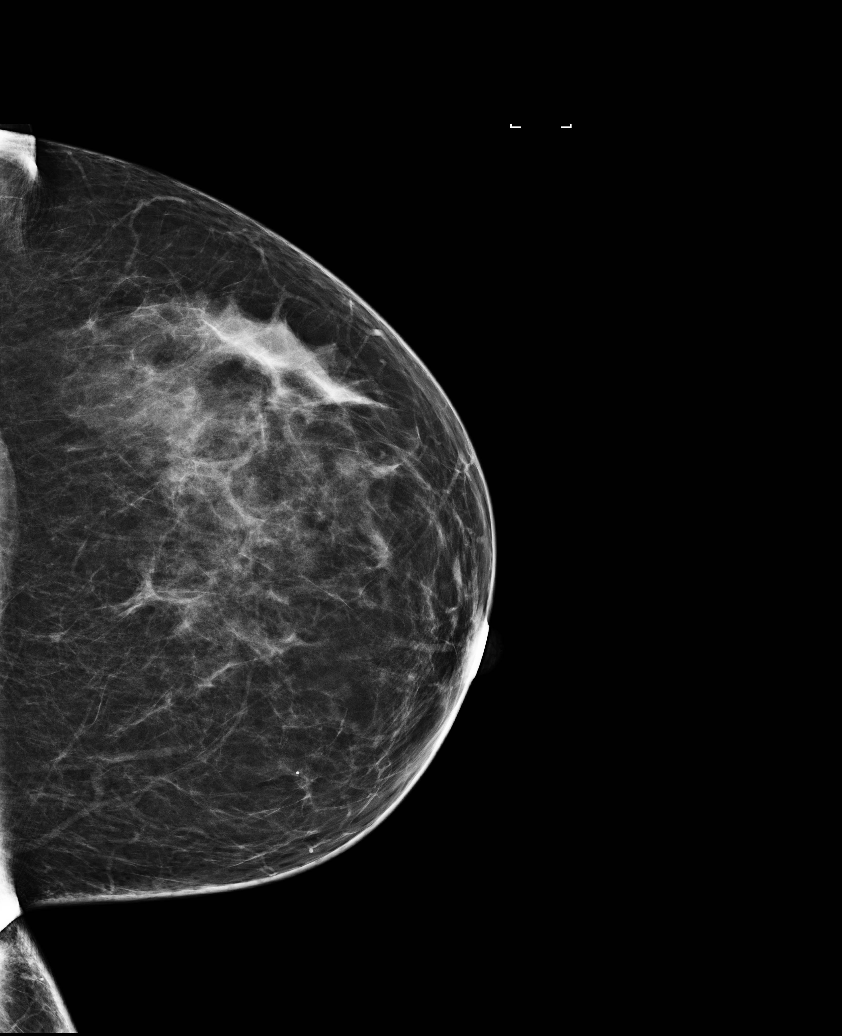

[R MLO (2 of 2)]
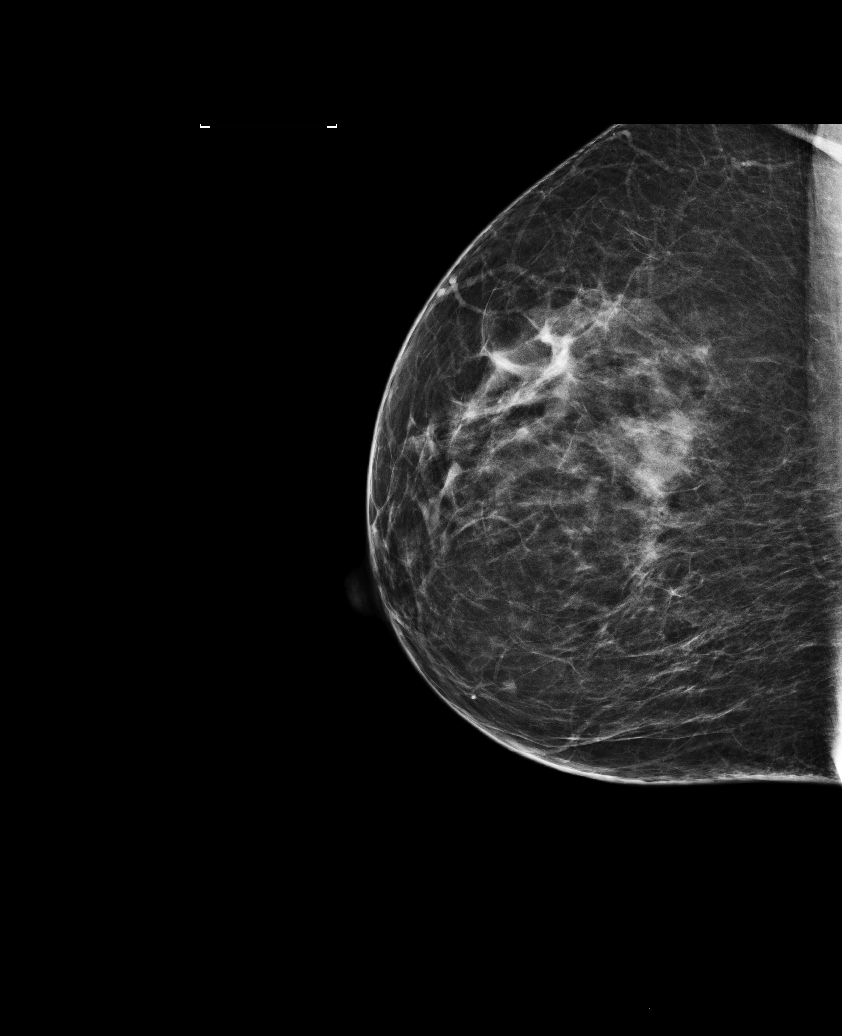

[L MLO]
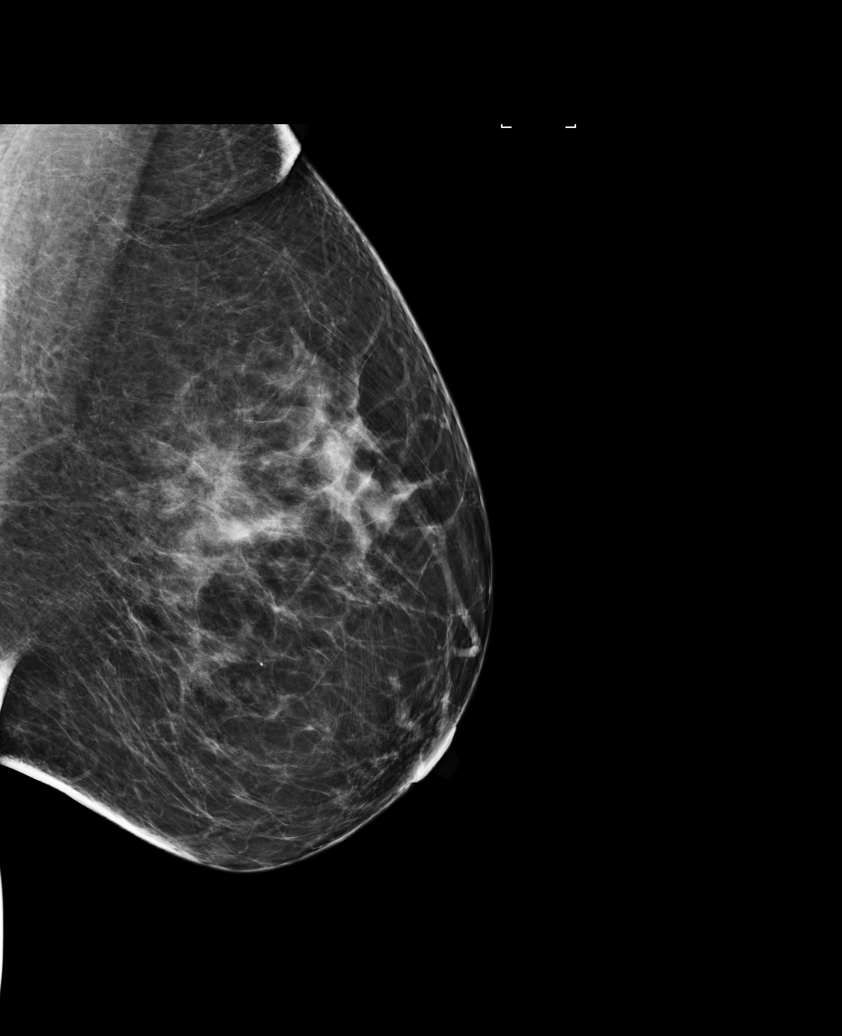

[5 of 5 positions shown; findings below may reference images not displayed]

ACR Breast Density Category b: There are scattered areas of
fibroglandular density.
FINDINGS: There are no findings suspicious for malignancy. Images were
processed with CAD.
IMPRESSION: No mammographic evidence of malignancy. A result letter of this
screening mammogram will be mailed directly to the patient.

RECOMMENDATION:
Screening mammogram in one year. (Code:AS-G-LCT)

BI-RADS CATEGORY  1: Negative.

## 2018-08-12 ENCOUNTER — Other Ambulatory Visit: Payer: Self-pay | Admitting: Unknown Physician Specialty

## 2018-08-12 DIAGNOSIS — E041 Nontoxic single thyroid nodule: Secondary | ICD-10-CM

## 2018-08-19 ENCOUNTER — Ambulatory Visit: Admission: RE | Admit: 2018-08-19 | Payer: Commercial Managed Care - PPO | Source: Ambulatory Visit

## 2018-08-26 ENCOUNTER — Ambulatory Visit: Admission: RE | Admit: 2018-08-26 | Payer: Commercial Managed Care - PPO | Source: Ambulatory Visit

## 2018-10-11 ENCOUNTER — Ambulatory Visit: Payer: Commercial Managed Care - PPO | Admitting: Family Medicine

## 2019-02-12 ENCOUNTER — Other Ambulatory Visit: Payer: Self-pay | Admitting: Family Medicine

## 2019-02-12 DIAGNOSIS — G44229 Chronic tension-type headache, not intractable: Secondary | ICD-10-CM

## 2019-02-12 DIAGNOSIS — I10 Essential (primary) hypertension: Secondary | ICD-10-CM

## 2019-02-14 NOTE — Telephone Encounter (Signed)
lvm informing pt that prescription has been sent to pharmacy also informed her to schedule appt

## 2019-05-10 ENCOUNTER — Telehealth: Payer: Self-pay | Admitting: Family Medicine

## 2019-05-10 DIAGNOSIS — I10 Essential (primary) hypertension: Secondary | ICD-10-CM

## 2019-05-10 DIAGNOSIS — G44229 Chronic tension-type headache, not intractable: Secondary | ICD-10-CM

## 2019-05-10 NOTE — Telephone Encounter (Signed)
Requested medication (s) are due for refill today: yes  Requested medication (s) are on the active medication list: yes  Last refill:  02/14/2019  Future visit scheduled: no  Notes to clinic:  Review for refill Overdue for office visit    Requested Prescriptions  Pending Prescriptions Disp Refills   metoprolol succinate (TOPROL-XL) 25 MG 24 hr tablet [Pharmacy Med Name: METOPROLOL SUCC ER 25 MG TAB] 90 tablet 0    Sig: TAKE 1 TABLET BY MOUTH EVERY DAY     Cardiovascular:  Beta Blockers Failed - 05/10/2019  1:12 AM      Failed - Valid encounter within last 6 months    Recent Outpatient Visits          1 year ago Well woman exam   [facility] Steele Sizer, MD   1 year ago OSA (obstructive sleep apnea)   [facility] Steele Sizer, MD   1 year ago OSA (obstructive sleep apnea)   [facility] Steele Sizer, MD   1 year ago Essential hypertension   [facility] [place], Drue Stager, MD   1 year ago Chronic tension-type headache, not intractable   [facility] [place], Drue Stager, MD             Passed - Last BP in normal range    BP Readings from Last 1 Encounters:  04/09/18 132/74         Passed - Last Heart Rate in normal range    Pulse Readings from Last 1 Encounters:  04/09/18 86

## 2019-05-11 NOTE — Telephone Encounter (Signed)
lvm for scheduleing

## 2019-06-07 ENCOUNTER — Other Ambulatory Visit: Payer: Self-pay

## 2019-06-07 ENCOUNTER — Ambulatory Visit (INDEPENDENT_AMBULATORY_CARE_PROVIDER_SITE_OTHER): Payer: Commercial Managed Care - PPO | Admitting: Family Medicine

## 2019-06-07 ENCOUNTER — Encounter: Payer: Self-pay | Admitting: Family Medicine

## 2019-06-07 VITALS — Wt 180.0 lb

## 2019-06-07 DIAGNOSIS — I341 Nonrheumatic mitral (valve) prolapse: Secondary | ICD-10-CM

## 2019-06-07 DIAGNOSIS — G44229 Chronic tension-type headache, not intractable: Secondary | ICD-10-CM | POA: Diagnosis not present

## 2019-06-07 DIAGNOSIS — E538 Deficiency of other specified B group vitamins: Secondary | ICD-10-CM

## 2019-06-07 DIAGNOSIS — E559 Vitamin D deficiency, unspecified: Secondary | ICD-10-CM

## 2019-06-07 DIAGNOSIS — G4730 Sleep apnea, unspecified: Secondary | ICD-10-CM

## 2019-06-07 DIAGNOSIS — J302 Other seasonal allergic rhinitis: Secondary | ICD-10-CM | POA: Diagnosis not present

## 2019-06-07 DIAGNOSIS — I10 Essential (primary) hypertension: Secondary | ICD-10-CM

## 2019-06-07 DIAGNOSIS — E049 Nontoxic goiter, unspecified: Secondary | ICD-10-CM

## 2019-06-07 DIAGNOSIS — Z131 Encounter for screening for diabetes mellitus: Secondary | ICD-10-CM

## 2019-06-07 DIAGNOSIS — E785 Hyperlipidemia, unspecified: Secondary | ICD-10-CM

## 2019-06-07 DIAGNOSIS — G4733 Obstructive sleep apnea (adult) (pediatric): Secondary | ICD-10-CM

## 2019-06-07 DIAGNOSIS — G4710 Hypersomnia, unspecified: Secondary | ICD-10-CM

## 2019-06-07 MED ORDER — METOPROLOL SUCCINATE ER 25 MG PO TB24: 25.0000 mg | ORAL_TABLET | Freq: Every day | ORAL | 1 refills | Status: DC

## 2019-06-07 MED ORDER — FLUTICASONE PROPIONATE 50 MCG/ACT NA SUSP: 1.0000 | NASAL | 1 refills | Status: DC | PRN

## 2019-06-07 NOTE — Progress Notes (Signed)
Name: [name]   MRN: [id number]    DOB: 02-28-1976   Date:06/07/2019       Progress Note  Subjective  Chief Complaint  Chief Complaint  Patient presents with  . Medication Refill  . Hypertension    Headaches and ankles are swelling  . Sleep Apnea    Just started back using it-5 or 6 hours nightly  . Obesity  . Hypersomnia with sleep apnea    I connected with  [name]  on 06/07/19 at  1:40 PM EST by a video enabled telemedicine application and verified that I am speaking with the correct person using two identifiers.  I discussed the limitations of evaluation and management by telemedicine and the availability of in person appointments. The patient expressed understanding and agreed to proceed. Staff also discussed with the patient that there may be a patient responsible charge related to this service. Patient Location: at home  Provider Location: [facility]   HPI    OSA: she is seeing Dr. Tami Ribas and had ESS of 19, sent for sleep study 07/28/2017 and found to have mild to moderate Sleep apnea with an AHI of 5.3, on CPAP since March 2019 we tried Provigil however she states she did not like the way it made her feel and stopped medication. Last visit with me was 04/2018, she had a gap of about 6 weeks of not using CPAP because she needed supplies but is back on it for the past month. She has noticed that when she does not wear CPAP she feels more tired during the day, interrupted sleep   HTN: unable to check bp at home, but states normal BP during biometric screening at work last week. She also had labs and will fax the results to me. No recent chest pain or palpitation. Avoiding caffeine. She has history of MVP seen by cardiologist   Goiter: Dr. Tami Ribas ordered thyroid US, reminded her to re-schedule  Obesity: discussed importance of weight loss because of co morbidities , she will try to eat breakfast and exercise more, going to weight  watchers but not consistently logging calories   Patient Active Problem List   Diagnosis Date Noted  . OSA (obstructive sleep apnea) 11/17/2017  . Hypersomnia with sleep apnea 11/17/2017  . Chronic tension-type headache, not intractable 05/20/2017  . Essential hypertension 05/20/2017  . Intermittent low back pain 04/21/2017  . GERD (gastroesophageal reflux disease) 03/12/2016  . Goiter 03/12/2016  . MVP (mitral valve prolapse) 02/15/2015  . History of iron deficiency anemia 02/15/2015  . Vitamin D deficiency 01/11/2015  . Vitamin B12 deficiency 01/11/2015  . Heart murmur 12/29/2011  . Fibroid uterus 12/29/2011  . Family history of breast cancer 12/29/2011    Past Surgical History:  Procedure Laterality Date  . LEEP    . WISDOM TOOTH EXTRACTION     x4    Family History  Problem Relation Age of Onset  . Cancer Mother 44       deceased at age 62 (breast)  . Diabetes Mother   . Hypertension Mother   . Breast cancer Mother 81  . Cancer Maternal Aunt 32       breast  . Breast cancer Maternal Aunt 31  . Cancer Maternal Grandmother 70       pancreatic  . Cancer Maternal Grandfather 67       throat  . Hypertension Father   . Cancer Father 50       prostate  .  Hypertension Brother   . Heart disease Paternal Grandmother        heart attack  . Heart disease Paternal Grandfather        heart attack.    Social History   Socioeconomic History  . Marital status: Married    Spouse name: Erick  . Number of children: 5  . Years of education: Not on file  . Highest education level: Not on file  Occupational History  . Not on file  Social Needs  . Financial resource strain: Not hard at all  . Food insecurity    Worry: Never true    Inability: Never true  . Transportation needs    Medical: No    Non-medical: No  Tobacco Use  . Smoking status: Never Smoker  . Smokeless tobacco: Never Used  Substance and Sexual Activity  . Alcohol use: No  . Drug use: No  . Sexual  activity: Yes    Partners: Male    Birth control/protection: Surgical    Comment: vasectomy  Lifestyle  . Physical activity    Days per week: 0 days    Minutes per session: 0 min  . Stress: Not at all  Relationships  . Social connections    Talks on phone: More than three times a week    Gets together: Twice a week    Attends religious service: Never    Active member of club or organization: No    Attends meetings of clubs or organizations: Never    Relationship status: Married  . Intimate partner violence    Fear of current or ex partner: No    Emotionally abused: No    Physically abused: No    Forced sexual activity: No  Other Topics Concern  . Not on file  Social History Narrative   Married, 5 children, one is grown and out of the house.    Youngest born 2008.   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  Cholecalciferol (VITAMIN D) 2000 units CAPS, Take by mouth., Disp: , Rfl:  .  metoprolol succinate (TOPROL-XL) 25 MG 24 hr tablet, TAKE 1 TABLET BY MOUTH EVERY DAY, Disp: 90 tablet, Rfl: 0 .  Cyanocobalamin (B-12 PO), Take by mouth., Disp: , Rfl:  .  fluticasone (FLONASE) 50 MCG/ACT nasal spray, Place 1 spray into both nostrils as needed. (Patient not taking: Reported on 06/07/2019), Disp: 48 g, Rfl: 1 .  Vitamin D, Ergocalciferol, (DRISDOL) 50000 units CAPS capsule, Take 1 capsule (50,000 Units total) by mouth every 7 (seven) days. (Patient not taking: Reported on 06/07/2019), Disp: 12 capsule, Rfl: 0  No Known Allergies  I personally reviewed active problem list, medication list, allergies, family history, social history, health maintenance with the patient/caregiver today.   ROS  Ten systems reviewed and is negative except as mentioned in HPI   Objective  Virtual encounter, vitals not obtained.  There is no height or weight on file to calculate BMI.  Physical  Exam  Awake, alert and oriented   PHQ2/9: Depression screen [facility] 2/9 06/07/2019 04/09/2018 11/17/2017 05/06/2017 04/07/2017  Decreased Interest 0 0 0 0 0  Down, Depressed, Hopeless 1 0 0 0 0  PHQ - 2 Score 1 0 0 0 0  Altered sleeping 3 0 0 0 -  Tired, decreased energy 0 0 1 1 -  Change in appetite 0 0 0  2 -  Feeling bad or failure about yourself  0 0 0 0 -  Trouble concentrating 1 0 0 1 -  Moving slowly or fidgety/restless 0 0 0 0 -  Suicidal thoughts 0 0 0 0 -  PHQ-9 Score 5 0 1 4 -  Difficult doing work/chores Not difficult at all Not difficult at all Somewhat difficult Not difficult at all -   PHQ-2/9 Result is negative.    Fall Risk: Fall Risk  06/07/2019 04/09/2018 11/17/2017 08/04/2017 06/02/2017  Falls in the past year? 0 No No No No  Number falls in past yr: 0 - - - -  Injury with Fall? 0 - - - -     Assessment & Plan  1. Chronic tension-type headache, not intractable  - metoprolol succinate (TOPROL-XL) 25 MG 24 hr tablet; Take 1 tablet (25 mg total) by mouth daily.  Dispense: 90 tablet; Refill: 1  2. Essential hypertension  - metoprolol succinate (TOPROL-XL) 25 MG 24 hr tablet; Take 1 tablet (25 mg total) by mouth daily.  Dispense: 90 tablet; Refill: 1 - CBC with Differential/Platelet - Comprehensive metabolic panel  3. Seasonal allergic rhinitis, unspecified trigger  - fluticasone (FLONASE) 50 MCG/ACT nasal spray; Place 1 spray into both nostrils as needed.  Dispense: 48 g; Refill: 1  4. Vitamin D deficiency  - VITAMIN D 25 Hydroxy (Vit-D Deficiency, Fractures)  5. Vitamin B12 deficiency  - CBC with Differential/Platelet - Vitamin B12  6. Hypersomnia with sleep apnea   7. OSA (obstructive sleep apnea)   8. Dyslipidemia  - Lipid panel  9. MVP (mitral valve prolapse)   10. Diabetes mellitus screening  - Hemoglobin A1c  11. Goiter  - Thyroid Panel With TSH  I discussed the assessment and treatment plan with the patient. The patient was provided  an opportunity to ask questions and all were answered. The patient agreed with the plan and demonstrated an understanding of the instructions.  The patient was advised to call back or seek an in-person evaluation if the symptoms worsen or if the condition fails to improve as anticipated.  I provided 25  minutes of non-face-to-face time during this encounter.

## 2019-08-19 ENCOUNTER — Ambulatory Visit: Payer: Self-pay | Admitting: *Deleted

## 2019-08-19 NOTE — Telephone Encounter (Signed)
Patient is calling to report she has had rectal bleeding - only trace amount on tissue when wipes- for 4 days. Advised patient we could see her today- but she declines this appointment. Patient appointment scheduled for Monday- with instructions- if she has changes, dizziness, etc she has to be seen at [facility] Patient voices understanding.  Reason for Disposition . MILD rectal bleeding (more than just a few drops or streaks)  Answer Assessment - Initial Assessment Questions 1. APPEARANCE of BLOOD: "What color is it?" "Is it passed separately, on the surface of the stool, or mixed in with the stool?"      notices when wipes- bright red 2. AMOUNT: "How much blood was passed?"      Just when wipes 3. FREQUENCY: "How many times has blood been passed with the stools?"      4 days- everytime 4. ONSET: "When was the blood first seen in the stools?" (Days or weeks)      4 days 5. DIARRHEA: "Is there also some diarrhea?" If so, ask: "How many diarrhea stools were passed in past 24 hours?"      no 6. CONSTIPATION: "Do you have constipation?" If so, "How bad is it?"     no 7. RECURRENT SYMPTOMS: "Have you had blood in your stools before?" If so, ask: "When was the last time?" and "What happened that time?"      no 8. BLOOD THINNERS: "Do you take any blood thinners?" (e.g., Coumadin/warfarin, Pradaxa/dabigatran, aspirin)     no 9. OTHER SYMPTOMS: "Do you have any other symptoms?"  (e.g., abdominal pain, vomiting, dizziness, fever)     Cramping- at other times 10. PREGNANCY: "Is there any chance you are pregnant?" "When was your last menstrual period?"       No- LMP- 3 weeks ago  Protocols used: RECTAL BLEEDING-A-AH

## 2019-08-22 ENCOUNTER — Telehealth: Payer: Self-pay

## 2019-08-22 ENCOUNTER — Ambulatory Visit: Payer: Commercial Managed Care - PPO | Admitting: Family Medicine

## 2019-08-22 ENCOUNTER — Other Ambulatory Visit: Payer: Self-pay

## 2019-08-22 ENCOUNTER — Encounter: Payer: Self-pay | Admitting: Family Medicine

## 2019-08-22 VITALS — BP 122/74 | HR 95 | Temp 97.9°F | Resp 14 | Ht 62.0 in | Wt 193.2 lb

## 2019-08-22 DIAGNOSIS — K644 Residual hemorrhoidal skin tags: Secondary | ICD-10-CM | POA: Diagnosis not present

## 2019-08-22 DIAGNOSIS — K625 Hemorrhage of anus and rectum: Secondary | ICD-10-CM

## 2019-08-22 MED ORDER — POLYETHYLENE GLYCOL 3350 17 GM/SCOOP PO POWD: 17.0000 g | Freq: Every day | ORAL | 0 refills | Status: DC

## 2019-08-22 MED ORDER — BUSPIRONE HCL 5 MG PO TABS: 5.0000 mg | ORAL_TABLET | Freq: Two times a day (BID) | ORAL | 2 refills | Status: DC

## 2019-08-22 MED ORDER — HYDROXYZINE HCL 25 MG PO TABS: 25.0000 mg | ORAL_TABLET | Freq: Every evening | ORAL | 1 refills | Status: DC | PRN

## 2019-08-22 MED ORDER — HYDROCORTISONE ACETATE 25 MG RE SUPP: 25.0000 mg | Freq: Two times a day (BID) | RECTAL | 0 refills | Status: DC

## 2019-08-22 NOTE — Patient Instructions (Signed)

## 2019-08-22 NOTE — Progress Notes (Signed)
Patient ID: [name], female    DOB: 1975-10-27, 44 y.o.   MRN: [id number]  PCP: Steele Sizer, MD  Chief Complaint  Patient presents with  . Rectal Bleeding    onset 1 week, denies any straining or constipation.  It does occur with movement, bright red blood only with wipping.  No hx of hemrrhoids    Subjective:   [name] is a 44 y.o. female, presents to clinic with CC of the following:  HPI  Patient presents complaining of about 4 days of bright red blood per rectum occurs after bowel movements or when wiping with toilet paper.  She states that there is no blood on her stool, no drops of blood into the toilet and only some blood on toilet paper.  She denies constipation or straining with BM, no rectal pain with BMs.  Starting Friday about 3 to 4 days ago she began experiencing itching after BM's  and she has had to go shower after having bowel movements or do excessive hygiene afterwards due to some irritation.  She denies any history of hemorrhoids, denies any swelling or pain in his rectum or anus, denies any abdominal pain, flank pain, nausea, vomiting, indigestion, bloating, urinary symptoms or vaginal symptoms.    Patient Active Problem List   Diagnosis Date Noted  . OSA (obstructive sleep apnea) 11/17/2017  . Hypersomnia with sleep apnea 11/17/2017  . Chronic tension-type headache, not intractable 05/20/2017  . Essential hypertension 05/20/2017  . Intermittent low back pain 04/21/2017  . GERD (gastroesophageal reflux disease) 03/12/2016  . Goiter 03/12/2016  . MVP (mitral valve prolapse) 02/15/2015  . History of iron deficiency anemia 02/15/2015  . Vitamin D deficiency 01/11/2015  . Vitamin B12 deficiency 01/11/2015  . Heart murmur 12/29/2011  . Fibroid uterus 12/29/2011  . Family history of breast cancer 12/29/2011      Current Outpatient Medications:  .  Cholecalciferol (VITAMIN D) 2000 units CAPS, Take by mouth., Disp: , Rfl:  .   fluticasone (FLONASE) 50 MCG/ACT nasal spray, Place 1 spray into both nostrils as needed., Disp: 48 g, Rfl: 1 .  metoprolol succinate (TOPROL-XL) 25 MG 24 hr tablet, Take 1 tablet (25 mg total) by mouth daily., Disp: 90 tablet, Rfl: 1 .  busPIRone (BUSPAR) 5 MG tablet, Take 1-2 tablets (5-10 mg total) by mouth 2 (two) times daily., Disp: 60 tablet, Rfl: 2 .  Cyanocobalamin (B-12 PO), Take by mouth., Disp: , Rfl:  .  hydrOXYzine (ATARAX/VISTARIL) 25 MG tablet, Take 1-2 tablets (25-50 mg total) by mouth at bedtime as needed (insomnia)., Disp: 60 tablet, Rfl: 1   No Known Allergies   Family History  Problem Relation Age of Onset  . Cancer Mother 25       deceased at age 60 (breast)  . Diabetes Mother   . Hypertension Mother   . Breast cancer Mother 70  . Cancer Maternal Aunt 32       breast  . Breast cancer Maternal Aunt 31  . Cancer Maternal Grandmother 70       pancreatic  . Cancer Maternal Grandfather 67       throat  . Hypertension Father   . Cancer Father 36       prostate  . Hypertension Brother   . Heart disease Paternal Grandmother        heart attack  . Heart disease Paternal Grandfather        heart attack.     Social  History   Socioeconomic History  . Marital status: Married    Spouse name: Erick  . Number of children: 5  . Years of education: Not on file  . Highest education level: Not on file  Occupational History  . Occupation: billing   Tobacco Use  . Smoking status: Never Smoker  . Smokeless tobacco: Never Used  Substance and Sexual Activity  . Alcohol use: No  . Drug use: No  . Sexual activity: Yes    Partners: Male    Birth control/protection: Surgical    Comment: vasectomy  Other Topics Concern  . Not on file  Social History Narrative   Married, 5 children, one is grown and out of the house.    Youngest born 2008.   Works full time at [employer] to school for health care information - online, graduates in 2019   Social Determinants  of Health   Financial Resource Strain:   . Difficulty of Paying Living Expenses: Not on file  Food Insecurity:   . Worried About Charity fundraiser in the Last Year: Not on file  . Ran Out of Food in the Last Year: Not on file  Transportation Needs:   . Lack of Transportation (Medical): Not on file  . Lack of Transportation (Non-Medical): Not on file  Physical Activity: Insufficiently Active  . Days of Exercise per Week: 3 days  . Minutes of Exercise per Session: 30 min  Stress:   . Feeling of Stress : Not on file  Social Connections:   . Frequency of Communication with Friends and Family: Not on file  . Frequency of Social Gatherings with Friends and Family: Not on file  . Attends Religious Services: Not on file  . Active Member of Clubs or Organizations: Not on file  . Attends Archivist Meetings: Not on file  . Marital Status: Not on file  Intimate Partner Violence:   . Fear of Current or Ex-Partner: Not on file  . Emotionally Abused: Not on file  . Physically Abused: Not on file  . Sexually Abused: Not on file    Chart Review Today: I personally reviewed active problem list, medication list, allergies, family history, social history, health maintenance, notes from last encounter, lab results, imaging with the patient/caregiver today.   Review of Systems  Constitutional: Negative.   HENT: Negative.   Eyes: Negative.   Respiratory: Negative.   Cardiovascular: Negative.   Gastrointestinal: Negative.   Endocrine: Negative.   Genitourinary: Negative.   Musculoskeletal: Negative.   Skin: Negative.   Allergic/Immunologic: Negative.   Neurological: Negative.   Hematological: Negative.   Psychiatric/Behavioral: Negative.   All other systems reviewed and are negative.      Objective:   Vitals:   08/22/19 1159  BP: 122/74  Pulse: 95  Resp: 14  Temp: 97.9 F (36.6 C)  SpO2: 98%  Weight: 193 lb 3.2 oz (87.6 kg)  Height: 5\' 2"  (1.575 m)    Body mass  index is 35.34 kg/m.  Physical Exam Vitals and nursing note reviewed. Exam conducted with a chaperone present.  Constitutional:      Appearance: She is well-developed.  HENT:     Head: Normocephalic and atraumatic.     Nose: Nose normal.  Eyes:     General:        Right eye: No discharge.        Left eye: No discharge.     Conjunctiva/sclera: Conjunctivae normal.  Neck:  Trachea: No tracheal deviation.  Cardiovascular:     Rate and Rhythm: Normal rate and regular rhythm.  Pulmonary:     Effort: Pulmonary effort is normal. No respiratory distress.     Breath sounds: No stridor.  Genitourinary:    Rectum: Guaiac result negative. External hemorrhoid present. No mass, tenderness, anal fissure or internal hemorrhoid. Normal anal tone.     Comments: Small area of suspected external hemorrhoid palpated with digital rectal exam, hard stool in small pieces palpated in rectal vault Hemoccult negative Musculoskeletal:        General: Normal range of motion.  Skin:    General: Skin is warm and dry.     Findings: No rash.  Neurological:     Mental Status: She is alert.     Motor: No abnormal muscle tone.     Coordination: Coordination normal.  Psychiatric:        Behavior: Behavior normal.            Assessment & Plan:      ICD-10-CM   1. External hemorrhoids without complication  0000000 hydrocortisone (ANUSOL-HC) 25 MG suppository    DISCONTINUED: hydrocortisone (ANUSOL-HC) 25 MG suppository  2. Rectal bleeding  K62.5 hydrocortisone (ANUSOL-HC) 25 MG suppository    polyethylene glycol powder (GLYCOLAX/MIRALAX) 17 GM/SCOOP powder    DISCONTINUED: hydrocortisone (ANUSOL-HC) 25 MG suppository    DISCONTINUED: polyethylene glycol powder (GLYCOLAX/MIRALAX) 17 GM/SCOOP powder   Likely secondary to trauma or hemorrhoid, Hemoccult negative, very scant amounts of blood reported will not do blood work today    Bedside hemoccult neg, hard stool in rectal vault, no prolapsed or  thrombosed hemorrhoids but swollen tissue palpated Conservative tx with stool softeners, increased fluids, hemorroid ointment, anusol f/up if not improving in 1-2 weeks.  Encourage patient to follow-up if there is any change of bowel movements any melena, hematochezia, large amounts of blood loss with want to recheck her do blood work etc.  Delsa Grana, PA-C 08/22/19 12:30 PM

## 2019-08-22 NOTE — Telephone Encounter (Signed)
Copied from [place] [id number]. Topic: General - Other >> Aug 22, 2019  1:04 PM Yvette Rack wrote: Reason for CRM: Pt stated her Rx should have gone to [id number], [place] - [address]  Phone: [phone]   Fax: [phone]

## 2019-08-22 NOTE — Progress Notes (Deleted)
Name: [name]   MRN: [id number]    DOB: 1975/07/19   Date:08/22/2019       Progress Note  Chief Complaint  Patient presents with  . Rectal Bleeding    onset 1 week, denies any straining or constipation.  It does occur with movement, bright red blood only with wipping.  No hx of hemrrhoids     Subjective:   [name] is a 44 y.o. female, presents to clinic for routine follow up on the conditions listed above.  Rectal bleeding Friday was the 4th time it happened, started one week ago. Blood on toilet paper with BMs No constipation straining with BM, no rectal pain with BMs until Friday, itching after BM's     Patient Active Problem List   Diagnosis Date Noted  . OSA (obstructive sleep apnea) 11/17/2017  . Hypersomnia with sleep apnea 11/17/2017  . Chronic tension-type headache, not intractable 05/20/2017  . Essential hypertension 05/20/2017  . Intermittent low back pain 04/21/2017  . GERD (gastroesophageal reflux disease) 03/12/2016  . Goiter 03/12/2016  . MVP (mitral valve prolapse) 02/15/2015  . History of iron deficiency anemia 02/15/2015  . Vitamin D deficiency 01/11/2015  . Vitamin B12 deficiency 01/11/2015  . Heart murmur 12/29/2011  . Fibroid uterus 12/29/2011  . Family history of breast cancer 12/29/2011    Past Surgical History:  Procedure Laterality Date  . LEEP    . WISDOM TOOTH EXTRACTION     x4    Family History  Problem Relation Age of Onset  . Cancer Mother 47       deceased at age 36 (breast)  . Diabetes Mother   . Hypertension Mother   . Breast cancer Mother 31  . Cancer Maternal Aunt 32       breast  . Breast cancer Maternal Aunt 31  . Cancer Maternal Grandmother 70       pancreatic  . Cancer Maternal Grandfather 67       throat  . Hypertension Father   . Cancer Father 93       prostate  . Hypertension Brother   . Heart disease Paternal Grandmother        heart attack  . Heart disease Paternal Grandfather      heart attack.    Social History   Tobacco Use  . Smoking status: Never Smoker  . Smokeless tobacco: Never Used  Substance Use Topics  . Alcohol use: No  . Drug use: No      Current Outpatient Medications:  .  Cholecalciferol (VITAMIN D) 2000 units CAPS, Take by mouth., Disp: , Rfl:  .  fluticasone (FLONASE) 50 MCG/ACT nasal spray, Place 1 spray into both nostrils as needed., Disp: 48 g, Rfl: 1 .  metoprolol succinate (TOPROL-XL) 25 MG 24 hr tablet, Take 1 tablet (25 mg total) by mouth daily., Disp: 90 tablet, Rfl: 1 .  busPIRone (BUSPAR) 5 MG tablet, Take 1-2 tablets (5-10 mg total) by mouth 2 (two) times daily., Disp: 60 tablet, Rfl: 2 .  Cyanocobalamin (B-12 PO), Take by mouth., Disp: , Rfl:  .  hydrOXYzine (ATARAX/VISTARIL) 25 MG tablet, Take 1-2 tablets (25-50 mg total) by mouth at bedtime as needed (insomnia)., Disp: 60 tablet, Rfl: 1  No Known Allergies  Chart Review Today: ***  Review of Systems   Objective:    Vitals:   08/22/19 1159  BP: 122/74  Pulse: 95  Resp: 14  Temp: 97.9 F (36.6 C)  SpO2: 98%  Weight: 193 lb 3.2 oz (87.6 kg)  Height: 5\' 2"  (1.575 m)    Body mass index is 35.34 kg/m.  Physical Exam    Diabetic Foot Exam: Diabetic Foot Exam - Simple   No data filed      PHQ2/9: Depression screen [facility] 2/9 08/22/2019 06/07/2019 04/09/2018 11/17/2017 05/06/2017  Decreased Interest 0 0 0 0 0  Down, Depressed, Hopeless 0 1 0 0 0  PHQ - 2 Score 0 1 0 0 0  Altered sleeping 0 3 0 0 0  Tired, decreased energy 0 0 0 1 1  Change in appetite 0 0 0 0 2  Feeling bad or failure about yourself  0 0 0 0 0  Trouble concentrating 0 1 0 0 1  Moving slowly or fidgety/restless 0 0 0 0 0  Suicidal thoughts 0 0 0 0 0  PHQ-9 Score 0 5 0 1 4  Difficult doing work/chores Not difficult at all Not difficult at all Not difficult at all Somewhat difficult Not difficult at all    phq 9 is ***  Fall Risk: Fall Risk  08/22/2019 06/07/2019 04/09/2018 11/17/2017 08/04/2017   Falls in the past year? 0 0 No No No  Number falls in past yr: 0 0 - - -  Injury with Fall? 0 0 - - -    Functional Status Survey: Is the patient deaf or have difficulty hearing?: No Does the patient have difficulty seeing, even when wearing glasses/contacts?: No Does the patient have difficulty concentrating, remembering, or making decisions?: No Does the patient have difficulty walking or climbing stairs?: No Does the patient have difficulty dressing or bathing?: No Does the patient have difficulty doing errands alone such as visiting a doctor's office or shopping?: No   Assessment & Plan:   ***  No follow-ups on file.   Delsa Grana, PA-C 08/22/19 12:20 PM

## 2019-10-06 ENCOUNTER — Ambulatory Visit: Payer: Commercial Managed Care - PPO | Admitting: Obstetrics & Gynecology

## 2019-10-18 ENCOUNTER — Ambulatory Visit: Payer: Commercial Managed Care - PPO | Admitting: Family Medicine

## 2019-10-31 ENCOUNTER — Other Ambulatory Visit: Payer: Self-pay

## 2019-10-31 ENCOUNTER — Encounter: Payer: Self-pay | Admitting: Obstetrics and Gynecology

## 2019-10-31 ENCOUNTER — Ambulatory Visit (INDEPENDENT_AMBULATORY_CARE_PROVIDER_SITE_OTHER): Payer: Commercial Managed Care - PPO | Admitting: Obstetrics and Gynecology

## 2019-10-31 VITALS — BP 156/91 | HR 92 | Wt 190.0 lb

## 2019-10-31 DIAGNOSIS — N644 Mastodynia: Secondary | ICD-10-CM | POA: Diagnosis not present

## 2019-10-31 DIAGNOSIS — Z803 Family history of malignant neoplasm of breast: Secondary | ICD-10-CM | POA: Diagnosis not present

## 2019-10-31 DIAGNOSIS — N6320 Unspecified lump in the left breast, unspecified quadrant: Secondary | ICD-10-CM

## 2019-10-31 NOTE — Progress Notes (Signed)
Noticed lump in left breast last Thursday  Last mammo 2018

## 2019-11-01 ENCOUNTER — Ambulatory Visit: Payer: Commercial Managed Care - PPO | Admitting: Family Medicine

## 2019-11-01 ENCOUNTER — Other Ambulatory Visit: Payer: Self-pay | Admitting: *Deleted

## 2019-11-01 DIAGNOSIS — Z803 Family history of malignant neoplasm of breast: Secondary | ICD-10-CM

## 2019-11-01 DIAGNOSIS — N6320 Unspecified lump in the left breast, unspecified quadrant: Secondary | ICD-10-CM

## 2019-11-01 LAB — CBC WITH DIFFERENTIAL/PLATELET
Basophils Absolute: 0 10*3/uL (ref 0.0–0.2)
Basos: 0 %
EOS (ABSOLUTE): 0.1 10*3/uL (ref 0.0–0.4)
Eos: 1 %
Hematocrit: 39.5 % (ref 34.0–46.6)
Hemoglobin: 12.6 g/dL (ref 11.1–15.9)
Immature Grans (Abs): 0 10*3/uL (ref 0.0–0.1)
Immature Granulocytes: 1 %
Lymphocytes Absolute: 2.4 10*3/uL (ref 0.7–3.1)
Lymphs: 37 %
MCH: 27.6 pg (ref 26.6–33.0)
MCHC: 31.9 g/dL (ref 31.5–35.7)
MCV: 87 fL (ref 79–97)
Monocytes Absolute: 0.7 10*3/uL (ref 0.1–0.9)
Monocytes: 11 %
Neutrophils Absolute: 3.3 10*3/uL (ref 1.4–7.0)
Neutrophils: 50 %
Platelets: 354 10*3/uL (ref 150–450)
RBC: 4.56 x10E6/uL (ref 3.77–5.28)
RDW: 12.6 % (ref 11.7–15.4)
WBC: 6.5 10*3/uL (ref 3.4–10.8)

## 2019-11-01 LAB — COMPREHENSIVE METABOLIC PANEL
ALT: 17 IU/L (ref 0–32)
AST: 18 IU/L (ref 0–40)
Albumin/Globulin Ratio: 1.4 (ref 1.2–2.2)
Albumin: 4.4 g/dL (ref 3.8–4.8)
Alkaline Phosphatase: 82 IU/L (ref 39–117)
BUN/Creatinine Ratio: 11 (ref 9–23)
BUN: 10 mg/dL (ref 6–24)
Bilirubin Total: 0.6 mg/dL (ref 0.0–1.2)
CO2: 22 mmol/L (ref 20–29)
Calcium: 9.6 mg/dL (ref 8.7–10.2)
Chloride: 104 mmol/L (ref 96–106)
Creatinine, Ser: 0.89 mg/dL (ref 0.57–1.00)
GFR calc Af Amer: 91 mL/min/{1.73_m2} (ref >59)
GFR calc non Af Amer: 79 mL/min/{1.73_m2} (ref >59)
Globulin, Total: 3.1 g/dL (ref 1.5–4.5)
Glucose: 86 mg/dL (ref 65–99)
Potassium: 3.9 mmol/L (ref 3.5–5.2)
Sodium: 139 mmol/L (ref 134–144)
Total Protein: 7.5 g/dL (ref 6.0–8.5)

## 2019-11-01 LAB — THYROID PANEL WITH TSH
Free Thyroxine Index: 1.9 (ref 1.2–4.9)
T3 Uptake Ratio: 25 % (ref 24–39)
T4, Total: 7.4 ug/dL (ref 4.5–12.0)
TSH: 4.98 u[IU]/mL — ABNORMAL HIGH (ref 0.450–4.500)

## 2019-11-01 LAB — LIPID PANEL
Chol/HDL Ratio: 4 ratio (ref 0.0–4.4)
Cholesterol, Total: 188 mg/dL (ref 100–199)
HDL: 47 mg/dL (ref >39)
LDL Chol Calc (NIH): 119 mg/dL — ABNORMAL HIGH (ref 0–99)
Triglycerides: 122 mg/dL (ref 0–149)
VLDL Cholesterol Cal: 22 mg/dL (ref 5–40)

## 2019-11-01 LAB — VITAMIN D 25 HYDROXY (VIT D DEFICIENCY, FRACTURES): Vit D, 25-Hydroxy: 31.3 ng/mL (ref 30.0–100.0)

## 2019-11-01 LAB — HEMOGLOBIN A1C
Est. average glucose Bld gHb Est-mCnc: 100 mg/dL
Hgb A1c MFr Bld: 5.1 % (ref 4.8–5.6)

## 2019-11-01 LAB — VITAMIN B12: Vitamin B-12: 428 pg/mL (ref 232–1245)

## 2019-11-01 NOTE — Progress Notes (Signed)
Obstetrics and Gynecology Visit Return Patient Evaluation  Appointment Date: 10/31/2019  Primary Care Provider: Steele Sizer  OBGYN Clinic: [facility]  Chief Complaint: left breast lump  History of Present Illness:  [name] is a 44 y.o. noticed it last week. ?no change in characteristics, no trauma, no nipple changes. Also notes some b/l breast tenderness  Review of Systems:  as noted in the History of Present Illness.   Patient Active Problem List   Diagnosis Date Noted  . OSA (obstructive sleep apnea) 11/17/2017  . Hypersomnia with sleep apnea 11/17/2017  . Chronic tension-type headache, not intractable 05/20/2017  . Essential hypertension 05/20/2017  . Intermittent low back pain 04/21/2017  . GERD (gastroesophageal reflux disease) 03/12/2016  . Goiter 03/12/2016  . MVP (mitral valve prolapse) 02/15/2015  . History of iron deficiency anemia 02/15/2015  . Vitamin D deficiency 01/11/2015  . Vitamin B12 deficiency 01/11/2015  . Heart murmur 12/29/2011  . Fibroid uterus 12/29/2011  . Family history of breast cancer 12/29/2011   Medications:  [name] had no medications administered during this visit. Current Outpatient Medications  Medication Sig Dispense Refill  . Cholecalciferol (VITAMIN D) 2000 units CAPS Take by mouth.    . Cyanocobalamin (B-12 PO) Take by mouth.    . fluticasone (FLONASE) 50 MCG/ACT nasal spray Place 1 spray into both nostrils as needed. 48 g 1  . hydrocortisone (ANUSOL-HC) 25 MG suppository Place 1 suppository (25 mg total) rectally 2 (two) times daily. 12 suppository 0  . metoprolol succinate (TOPROL-XL) 25 MG 24 hr tablet Take 1 tablet (25 mg total) by mouth daily. 90 tablet 1  . polyethylene glycol powder (GLYCOLAX/MIRALAX) 17 GM/SCOOP powder Take 17 g by mouth daily. 255 g 0   No current facility-administered medications for this visit.    Allergies: has No Known Allergies.  Physical Exam:   BP (!) 156/91   Pulse 92   Wt 190 lb (86.2 kg)   LMP 10/22/2019   BMI 34.75 kg/m  Body mass index is 34.75 kg/m. General appearance: Well nourished, well developed female in no acute distress.  Breast: normal inspection. No masses or lumps felt. No nipple changes Neuro/Psych:  Normal mood and affect.    Assessment: pt stable  Plan:  1. Family history of breast cancer Information for invitae possible repeat testing given her BRCA testing has been at least five years ago, maybe even ten years ago.  - MM Digital Diagnostic Bilat; Future - US BREAST COMPLETE UNI LEFT INC AXILLA; Future  2. Left breast lump - MM Digital Diagnostic Bilat; Future - US BREAST COMPLETE UNI LEFT INC AXILLA; Future  3. Mastalgia Could be related to cycle. Pt told to let me know if persists after two months.  - MM Digital Diagnostic Bilat; Future - US BREAST COMPLETE UNI LEFT INC AXILLA; Future   RTC: PRN  Durene Romans MD Attending Center [facility])

## 2019-11-09 ENCOUNTER — Ambulatory Visit
Admission: RE | Admit: 2019-11-09 | Discharge: 2019-11-09 | Disposition: A | Discharge status: Home / Self Care | Payer: Commercial Managed Care - PPO | Source: Ambulatory Visit | Attending: Obstetrics and Gynecology | Admitting: Obstetrics and Gynecology

## 2019-11-09 DIAGNOSIS — Z803 Family history of malignant neoplasm of breast: Secondary | ICD-10-CM | POA: Insufficient documentation

## 2019-11-09 DIAGNOSIS — N6320 Unspecified lump in the left breast, unspecified quadrant: Secondary | ICD-10-CM

## 2019-11-10 ENCOUNTER — Other Ambulatory Visit: Payer: Self-pay | Admitting: Obstetrics and Gynecology

## 2019-11-10 DIAGNOSIS — R928 Other abnormal and inconclusive findings on diagnostic imaging of breast: Secondary | ICD-10-CM

## 2019-11-17 ENCOUNTER — Ambulatory Visit
Admission: RE | Admit: 2019-11-17 | Discharge: 2019-11-17 | Disposition: A | Discharge status: Home / Self Care | Payer: Commercial Managed Care - PPO | Source: Ambulatory Visit | Attending: Obstetrics and Gynecology | Admitting: Obstetrics and Gynecology

## 2019-11-17 DIAGNOSIS — R928 Other abnormal and inconclusive findings on diagnostic imaging of breast: Secondary | ICD-10-CM | POA: Diagnosis present

## 2019-11-17 HISTORY — PX: BREAST BIOPSY: SHX20

## 2019-11-18 LAB — SURGICAL PATHOLOGY

## 2020-02-02 ENCOUNTER — Other Ambulatory Visit: Payer: Self-pay

## 2020-02-02 ENCOUNTER — Other Ambulatory Visit (HOSPITAL_COMMUNITY)
Admission: RE | Admit: 2020-02-02 | Discharge: 2020-02-02 | Disposition: A | Discharge status: Home / Self Care | Payer: Commercial Managed Care - PPO | Source: Ambulatory Visit | Attending: Family Medicine | Admitting: Family Medicine

## 2020-02-02 ENCOUNTER — Encounter: Payer: Self-pay | Admitting: Family Medicine

## 2020-02-02 ENCOUNTER — Ambulatory Visit (INDEPENDENT_AMBULATORY_CARE_PROVIDER_SITE_OTHER): Payer: Commercial Managed Care - PPO | Admitting: Family Medicine

## 2020-02-02 VITALS — BP 145/88 | HR 99 | Wt 194.5 lb

## 2020-02-02 DIAGNOSIS — Z124 Encounter for screening for malignant neoplasm of cervix: Secondary | ICD-10-CM | POA: Insufficient documentation

## 2020-02-02 DIAGNOSIS — E049 Nontoxic goiter, unspecified: Secondary | ICD-10-CM | POA: Diagnosis not present

## 2020-02-02 DIAGNOSIS — Z01419 Encounter for gynecological examination (general) (routine) without abnormal findings: Secondary | ICD-10-CM

## 2020-02-02 DIAGNOSIS — Z803 Family history of malignant neoplasm of breast: Secondary | ICD-10-CM

## 2020-02-02 DIAGNOSIS — I10 Essential (primary) hypertension: Secondary | ICD-10-CM

## 2020-02-02 DIAGNOSIS — Z1211 Encounter for screening for malignant neoplasm of colon: Secondary | ICD-10-CM

## 2020-02-02 NOTE — Assessment & Plan Note (Addendum)
This patient has a very strong family history of breast cancer, including in her mother diagnosed at age 46 and a maternal aunt diagnosed with breast cancer at age 67. According to the IBIS breast Cancer Risk Assessment Tool, this patient has a greater than 21 percent lifetime risk of developing breast cancer. Based on the recommendations of the [place], annual screening MRI is suggested in addition to annual mammography if the patient has an estimated lifetime risk of developing breast cancer which is greater than 20%.

## 2020-02-02 NOTE — Assessment & Plan Note (Signed)
Did not take her meds today

## 2020-02-02 NOTE — Assessment & Plan Note (Signed)
Per thyroid u/s does not need f/u.

## 2020-02-02 NOTE — Patient Instructions (Signed)
 Preventive Care 21-46 Years Old, Female Preventive care refers to visits with your health care provider and lifestyle choices that can promote health and wellness. This includes:  A yearly physical exam. This may also be called an annual well check.  Regular dental visits and eye exams.  Immunizations.  Screening for certain conditions.  Healthy lifestyle choices, such as eating a healthy diet, getting regular exercise, not using drugs or products that contain nicotine and tobacco, and limiting alcohol use. What can I expect for my preventive care visit? Physical exam Your health care provider will check your:  Height and weight. This may be used to calculate body mass index (BMI), which tells if you are at a healthy weight.  Heart rate and blood pressure.  Skin for abnormal spots. Counseling Your health care provider may ask you questions about your:  Alcohol, tobacco, and drug use.  Emotional well-being.  Home and relationship well-being.  Sexual activity.  Eating habits.  Work and work environment.  Method of birth control.  Menstrual cycle.  Pregnancy history. What immunizations do I need?  Influenza (flu) vaccine  This is recommended every year. Tetanus, diphtheria, and pertussis (Tdap) vaccine  You may need a Td booster every 10 years. Varicella (chickenpox) vaccine  You may need this if you have not been vaccinated. Human papillomavirus (HPV) vaccine  If recommended by your health care provider, you may need three doses over 6 months. Measles, mumps, and rubella (MMR) vaccine  You may need at least one dose of MMR. You may also need a second dose. Meningococcal conjugate (MenACWY) vaccine  One dose is recommended if you are age 19-21 years and a first-year college student living in a residence hall, or if you have one of several medical conditions. You may also need additional booster doses. Pneumococcal conjugate (PCV13) vaccine  You may need  this if you have certain conditions and were not previously vaccinated. Pneumococcal polysaccharide (PPSV23) vaccine  You may need one or two doses if you smoke cigarettes or if you have certain conditions. Hepatitis A vaccine  You may need this if you have certain conditions or if you travel or work in places where you may be exposed to hepatitis A. Hepatitis B vaccine  You may need this if you have certain conditions or if you travel or work in places where you may be exposed to hepatitis B. Haemophilus influenzae type b (Hib) vaccine  You may need this if you have certain conditions. You may receive vaccines as individual doses or as more than one vaccine together in one shot (combination vaccines). Talk with your health care provider about the risks and benefits of combination vaccines. What tests do I need?  Blood tests  Lipid and cholesterol levels. These may be checked every 5 years starting at age 20.  Hepatitis C test.  Hepatitis B test. Screening  Diabetes screening. This is done by checking your blood sugar (glucose) after you have not eaten for a while (fasting).  Sexually transmitted disease (STD) testing.  BRCA-related cancer screening. This may be done if you have a family history of breast, ovarian, tubal, or peritoneal cancers.  Pelvic exam and Pap test. This may be done every 3 years starting at age 21. Starting at age 30, this may be done every 5 years if you have a Pap test in combination with an HPV test. Talk with your health care provider about your test results, treatment options, and if necessary, the need for more   tests. Follow these instructions at home: Eating and drinking   Eat a diet that includes fresh fruits and vegetables, whole grains, lean protein, and low-fat dairy.  Take vitamin and mineral supplements as recommended by your health care provider.  Do not drink alcohol if: ? Your health care provider tells you not to drink. ? You are  pregnant, may be pregnant, or are planning to become pregnant.  If you drink alcohol: ? Limit how much you have to 0-1 drink a day. ? Be aware of how much alcohol is in your drink. In the [place] one drink equals one 12 oz bottle of beer (355 mL), one 5 oz glass of wine (148 mL), or one 1 oz glass of hard liquor (44 mL). Lifestyle  Take daily care of your teeth and gums.  Stay active. Exercise for at least 30 minutes on 5 or more days each week.  Do not use any products that contain nicotine or tobacco, such as cigarettes, e-cigarettes, and chewing tobacco. If you need help quitting, ask your health care provider.  If you are sexually active, practice safe sex. Use a condom or other form of birth control (contraception) in order to prevent pregnancy and STIs (sexually transmitted infections). If you plan to become pregnant, see your health care provider for a preconception visit. What's next?  Visit your health care provider once a year for a well check visit.  Ask your health care provider how often you should have your eyes and teeth checked.  Stay up to date on all vaccines. This information is not intended to replace advice given to you by your health care provider. Make sure you discuss any questions you have with your health care provider. Document Revised: 03/04/2018 Document Reviewed: 03/04/2018 Elsevier Patient Education  2020 [employer].  

## 2020-02-02 NOTE — Progress Notes (Signed)
  Subjective:     [name] is a 44 y.o. female and is here for a comprehensive physical exam. The patient reports no problems.   The following portions of the patient's history were reviewed and updated as appropriate: allergies, current medications, past family history, past medical history, past social history, past surgical history and problem list.  Review of Systems Pertinent items noted in HPI and remainder of comprehensive ROS otherwise negative.   Objective:    BP (!) 145/88   Pulse 99   Wt 194 lb 8 oz (88.2 kg)   LMP 12/21/2019   BMI 35.57 kg/m  General appearance: alert, cooperative, appears stated age and moderately obese Head: Normocephalic, without obvious abnormality, atraumatic Neck: no adenopathy, supple, symmetrical, trachea midline and thyroid: enlarged and right Lungs: clear to auscultation bilaterally Breasts: normal appearance, no masses or tenderness Heart: regular rate and rhythm, S1, S2 normal, no murmur, click, rub or gallop Abdomen: soft, non-tender; bowel sounds normal; no masses,  no organomegaly Pelvic: cervix normal in appearance, external genitalia normal, no adnexal masses or tenderness, no cervical motion tenderness, vagina normal without discharge and uterus is 12 wk size and firm Extremities: extremities normal, atraumatic, no cyanosis or edema Pulses: 2+ and symmetric Skin: Skin color, texture, turgor normal. No rashes or lesions Lymph nodes: Cervical, supraclavicular, and axillary nodes normal. Neurologic: Grossly normal    Assessment:    Healthy female exam.      Plan:   Problem List Items Addressed This Visit      Unprioritized   Family history of breast cancer - Primary    This patient has a very strong family history of breast cancer, including in her mother diagnosed at age 58 and a maternal aunt diagnosed with breast cancer at age 26. According to the IBIS breast Cancer Risk Assessment Tool, this patient has a greater  than 21 percent lifetime risk of developing breast cancer. Based on the recommendations of the [place], annual screening MRI is suggested in addition to annual mammography if the patient has an estimated lifetime risk of developing breast cancer which is greater than 20%.      Relevant Orders   MR BREAST BILATERAL WO CONTRAST   Goiter    Per thyroid u/s does not need f/u.      Essential hypertension    Did not take her meds today       Other Visit Diagnoses    Screening for malignant neoplasm of cervix       Relevant Orders   Cytology - PAP( [facility])   Encounter for gynecological examination without abnormal finding       Screen for colon cancer       Relevant Orders   Ambulatory referral to Gastroenterology     Return in 6 months (on 08/04/2020) for clinical breast exam.    See After Visit Summary for Counseling Recommendations

## 2020-02-02 NOTE — Progress Notes (Signed)
Last Mammogram 11/17/2019-normal Last pap 04/15/2017

## 2020-02-07 LAB — CYTOLOGY - PAP
Comment: NEGATIVE
Diagnosis: NEGATIVE
High risk HPV: NEGATIVE

## 2020-03-21 ENCOUNTER — Other Ambulatory Visit: Payer: Self-pay

## 2020-03-21 ENCOUNTER — Telehealth (INDEPENDENT_AMBULATORY_CARE_PROVIDER_SITE_OTHER): Payer: Self-pay | Admitting: Gastroenterology

## 2020-03-21 DIAGNOSIS — Z1211 Encounter for screening for malignant neoplasm of colon: Secondary | ICD-10-CM

## 2020-03-21 MED ORDER — NA SULFATE-K SULFATE-MG SULF 17.5-3.13-1.6 GM/177ML PO SOLN: 1.0000 | Freq: Once | ORAL | 0 refills | Status: AC

## 2020-03-21 NOTE — Progress Notes (Signed)
Gastroenterology Pre-Procedure Review  Request Date:  Wed 04/11/20 Requesting Physician: Dr. Bonna Gains  PATIENT REVIEW QUESTIONS: The patient responded to the following health history questions as indicated:    1. Are you having any GI issues? no 2. Do you have a personal history of Polyps? no 3. Do you have a family history of Colon Cancer or Polyps? no 4. Diabetes Mellitus? no 5. Joint replacements in the past 12 months?no 6. Major health problems in the past 3 months?no 7. Any artificial heart valves, MVP, or defibrillator?no    MEDICATIONS & ALLERGIES:    Patient reports the following regarding taking any anticoagulation/antiplatelet therapy:   Plavix, Coumadin, Eliquis, Xarelto, Lovenox, Pradaxa, Brilinta, or Effient? no Aspirin? no  Patient confirms/reports the following medications:  Current Outpatient Medications  Medication Sig Dispense Refill  . fluticasone (FLONASE) 50 MCG/ACT nasal spray Place 1 spray into both nostrils as needed. 48 g 1  . metoprolol succinate (TOPROL-XL) 25 MG 24 hr tablet Take 1 tablet (25 mg total) by mouth daily. 90 tablet 1  . Cholecalciferol (VITAMIN D) 2000 units CAPS Take by mouth. (Patient not taking: Reported on 03/21/2020)    . Cyanocobalamin (B-12 PO) Take by mouth. (Patient not taking: Reported on 03/21/2020)    . hydrocortisone (ANUSOL-HC) 25 MG suppository Place 1 suppository (25 mg total) rectally 2 (two) times daily. (Patient not taking: Reported on 03/21/2020) 12 suppository 0  . Na Sulfate-K Sulfate-Mg Sulf 17.5-3.13-1.6 GM/177ML SOLN Take 1 kit by mouth once for 1 dose. 354 mL 0  . polyethylene glycol powder (GLYCOLAX/MIRALAX) 17 GM/SCOOP powder Take 17 g by mouth daily. (Patient not taking: Reported on 03/21/2020) 255 g 0   No current facility-administered medications for this visit.    Patient confirms/reports the following allergies:  No Known Allergies  Orders Placed This Encounter  Procedures  . Procedural/ Surgical Case  Request: COLONOSCOPY WITH PROPOFOL    Standing Status:   Standing    Number of Occurrences:   1    Order Specific Question:   Pre-op diagnosis    Answer:   screening colonoscopy    Order Specific Question:   CPT Code    Answer:   16837    AUTHORIZATION INFORMATION Primary Insurance: 1D#: Group #:  Secondary Insurance: 1D#: Group #:  SCHEDULE INFORMATION: Date: 04/11/20 Time: Location:[facility]

## 2020-03-30 ENCOUNTER — Telehealth: Payer: Self-pay | Admitting: Gastroenterology

## 2020-03-30 NOTE — Telephone Encounter (Signed)
Patient left voicemail to cancel procedure on 10.6.21 with Dr. Bonna Gains due to her husband testing positive for covid. Patient states she will cb to resch. Please cancel procedure till further notice. Pt had screening nurse visit on 9.15.21.

## 2020-03-30 NOTE — Telephone Encounter (Signed)
Patient's procedure has been cancelled for 04/11/20. Will call back to reschedule.

## 2020-04-10 ENCOUNTER — Encounter: Payer: Self-pay | Admitting: Family Medicine

## 2020-04-11 ENCOUNTER — Encounter: Admission: RE | Payer: Self-pay | Source: Home / Self Care

## 2020-04-11 ENCOUNTER — Ambulatory Visit
Admission: RE | Admit: 2020-04-11 | Payer: Commercial Managed Care - PPO | Source: Home / Self Care | Admitting: Gastroenterology

## 2020-04-11 SURGERY — COLONOSCOPY WITH PROPOFOL
Anesthesia: General

## 2020-04-11 NOTE — Progress Notes (Signed)
Patient

## 2020-04-12 ENCOUNTER — Encounter: Payer: Commercial Managed Care - PPO | Admitting: Internal Medicine

## 2020-06-13 ENCOUNTER — Telehealth: Payer: Self-pay | Admitting: Family Medicine

## 2020-06-13 ENCOUNTER — Other Ambulatory Visit: Payer: Self-pay | Admitting: Family Medicine

## 2020-06-13 DIAGNOSIS — I10 Essential (primary) hypertension: Secondary | ICD-10-CM

## 2020-06-13 DIAGNOSIS — G44229 Chronic tension-type headache, not intractable: Secondary | ICD-10-CM

## 2020-06-13 NOTE — Telephone Encounter (Signed)
Patient requesting metoprolol succinate (TOPROL-XL) 25 MG 24 hr tablet, informed please allow 48 to 72 hour turn around time. Patient scheduled a physical appointment for 09/17/2020 PCP next available)    [id number] [place], [place] - [address] Phone:  [phone]  Fax:  [phone]

## 2020-06-13 NOTE — Telephone Encounter (Signed)
Requested medication (s) are due for refill today: yes   Requested medication (s) are on the active medication list: yes   Last refill:  04/21/2020  Future visit scheduled: no   Notes to clinic: overdue for 6 month follow up Message left for patient to call and schedule appt    Requested Prescriptions  Pending Prescriptions Disp Refills   metoprolol succinate (TOPROL-XL) 25 MG 24 hr tablet [Pharmacy Med Name: METOPROLOL SUCC ER 25 MG TAB] 90 tablet 1    Sig: TAKE 1 TABLET BY MOUTH EVERY DAY      Cardiovascular:  Beta Blockers Failed - 06/13/2020  8:43 AM      Failed - Last BP in normal range    BP Readings from Last 1 Encounters:  02/02/20 (!) 145/88          Failed - Valid encounter within last 6 months    Recent Outpatient Visits           9 months ago External hemorrhoids without complication   [facility] Delsa Grana, PA-C   1 year ago Vitamin D deficiency   [facility] Steele Sizer, MD   2 years ago Well woman exam   [facility] Steele Sizer, MD   2 years ago OSA (obstructive sleep apnea)   [facility] Steele Sizer, MD   2 years ago OSA (obstructive sleep apnea)   [facility] [place], Drue Stager, MD              Passed - Last Heart Rate in normal range    Pulse Readings from Last 1 Encounters:  02/02/20 99

## 2020-06-15 ENCOUNTER — Other Ambulatory Visit: Payer: Self-pay | Admitting: Family Medicine

## 2020-06-15 ENCOUNTER — Telehealth: Payer: Self-pay

## 2020-06-15 DIAGNOSIS — G44229 Chronic tension-type headache, not intractable: Secondary | ICD-10-CM

## 2020-06-15 DIAGNOSIS — I10 Essential (primary) hypertension: Secondary | ICD-10-CM

## 2020-06-15 MED ORDER — METOPROLOL SUCCINATE ER 25 MG PO TB24: 25.0000 mg | ORAL_TABLET | Freq: Every day | ORAL | 0 refills | Status: DC

## 2020-06-15 NOTE — Telephone Encounter (Signed)
Copied from [place] [id number]. Topic: General - Inquiry >> Jun 15, 2020 11:49 AM Gillis Ends D wrote: Reason for CRM: Patient called and said that the pharmacy didn't receive the prescription sent on 12-08. When she went to pick it up today they said it wasn't there. She would like for the prescription to be sent again. Please advise

## 2020-06-21 ENCOUNTER — Ambulatory Visit: Payer: Self-pay

## 2020-06-21 NOTE — Progress Notes (Deleted)
Patient is a 44 year old female patient of Dr. Ancil Boozer Follows up today with the above complaints    She has a history of a goiter noted from September 2017. On that visit, it was noted to be smooth, enlarged, and that she had seen a Dr. Tami Ribas in the past Her thyroid panel was normal 10/31/2019 with a slightly high TSH noted Lab Results  Component Value Date   TSH 4.980 (H) 10/31/2019

## 2020-06-21 NOTE — Telephone Encounter (Signed)
Pt. States x 2 weeks has noticed a lump on right side of neck and feels "a lump inside my throat." Denies sore throat or difficulty swallowing. No runny nose or fever. States in the past she has ultrasounds on "my thyroid and I thinks that what is wrong. My thyroid."  Reason for Disposition . [1] Sore throat is the only symptom AND [2] present > 48 hours  Answer Assessment - Initial Assessment Questions 1. ONSET: "When did the throat start hurting?" (Hours or days ago)      2 weeks ago 2. SEVERITY: "How bad is the sore throat?" (Scale 1-10; mild, moderate or severe)   - MILD (1-3):  doesn't interfere with eating or normal activities   - MODERATE (4-7): interferes with eating some solids and normal activities   - SEVERE (8-10):  excruciating pain, interferes with most normal activities   - SEVERE DYSPHAGIA: can't swallow liquids, drooling     Mild 3. STREP EXPOSURE: "Has there been any exposure to strep within the past week?" If Yes, ask: "What type of contact occurred?"      No 4.  VIRAL SYMPTOMS: "Are there any symptoms of a cold, such as a runny nose, cough, hoarse voice or red eyes?"      No 5. FEVER: "Do you have a fever?" If Yes, ask: "What is your temperature, how was it measured, and when did it start?"     No 6. PUS ON THE TONSILS: "Is there pus on the tonsils in the back of your throat?"     No 7. OTHER SYMPTOMS: "Do you have any other symptoms?" (e.g., difficulty breathing, headache, rash)     No 8. PREGNANCY: "Is there any chance you are pregnant?" "When was your last menstrual period?"     No  Protocols used: SORE THROAT-A-AH

## 2020-06-22 ENCOUNTER — Ambulatory Visit: Payer: Self-pay | Admitting: Internal Medicine

## 2020-06-25 ENCOUNTER — Ambulatory Visit: Payer: Self-pay | Admitting: Internal Medicine

## 2020-06-28 ENCOUNTER — Other Ambulatory Visit: Payer: Self-pay | Admitting: Physician Assistant

## 2020-06-28 DIAGNOSIS — E041 Nontoxic single thyroid nodule: Secondary | ICD-10-CM

## 2020-07-10 ENCOUNTER — Other Ambulatory Visit: Payer: Self-pay

## 2020-07-10 ENCOUNTER — Ambulatory Visit
Admission: RE | Admit: 2020-07-10 | Discharge: 2020-07-10 | Disposition: A | Discharge status: Home / Self Care | Payer: Commercial Managed Care - PPO | Source: Ambulatory Visit | Attending: Physician Assistant | Admitting: Physician Assistant

## 2020-07-10 DIAGNOSIS — E041 Nontoxic single thyroid nodule: Secondary | ICD-10-CM | POA: Insufficient documentation

## 2020-07-11 ENCOUNTER — Other Ambulatory Visit: Payer: Self-pay | Admitting: Family Medicine

## 2020-07-11 ENCOUNTER — Encounter: Payer: Self-pay | Admitting: Family Medicine

## 2020-07-11 DIAGNOSIS — E049 Nontoxic goiter, unspecified: Secondary | ICD-10-CM

## 2020-08-14 ENCOUNTER — Telehealth: Payer: Self-pay | Admitting: Gastroenterology

## 2020-08-14 NOTE — Telephone Encounter (Signed)
Please call back to reschedule procedure

## 2020-08-23 NOTE — Telephone Encounter (Signed)
Has telephone visit with Sharyn Lull tomorrow to scheduled colonoscopy

## 2020-08-24 ENCOUNTER — Other Ambulatory Visit: Payer: Self-pay

## 2020-08-24 ENCOUNTER — Telehealth (INDEPENDENT_AMBULATORY_CARE_PROVIDER_SITE_OTHER): Payer: Self-pay | Admitting: Gastroenterology

## 2020-08-24 DIAGNOSIS — Z1211 Encounter for screening for malignant neoplasm of colon: Secondary | ICD-10-CM

## 2020-08-24 MED ORDER — NA SULFATE-K SULFATE-MG SULF 17.5-3.13-1.6 GM/177ML PO SOLN: 1.0000 | Freq: Once | ORAL | 0 refills | Status: AC

## 2020-08-24 NOTE — Progress Notes (Signed)
Gastroenterology Pre-Procedure Review  Request Date: Friday 09/28/20 Requesting Physician: Dr. Bonna Gains  PATIENT REVIEW QUESTIONS: The patient responded to the following health history questions as indicated:    1. Are you having any GI issues? no 2. Do you have a personal history of Polyps? no 3. Do you have a family history of Colon Cancer or Polyps? no 4. Diabetes Mellitus? no 5. Joint replacements in the past 12 months?no 6. Major health problems in the past 3 months?no 7. Any artificial heart valves, MVP, or defibrillator?no    MEDICATIONS & ALLERGIES:    Patient reports the following regarding taking any anticoagulation/antiplatelet therapy:   Plavix, Coumadin, Eliquis, Xarelto, Lovenox, Pradaxa, Brilinta, or Effient? no Aspirin? no  Patient confirms/reports the following medications:  Current Outpatient Medications  Medication Sig Dispense Refill  . fluticasone (FLONASE) 50 MCG/ACT nasal spray Place 1 spray into both nostrils as needed. 48 g 1  . metoprolol succinate (TOPROL-XL) 25 MG 24 hr tablet Take 1 tablet (25 mg total) by mouth daily. 90 tablet 0  . Cholecalciferol (VITAMIN D) 2000 units CAPS Take by mouth. (Patient not taking: No sig reported)    . Cyanocobalamin (B-12 PO) Take by mouth. (Patient not taking: No sig reported)     No current facility-administered medications for this visit.    Patient confirms/reports the following allergies:  No Known Allergies  No orders of the defined types were placed in this encounter.   AUTHORIZATION INFORMATION Primary Insurance: 1D#: Group #:  Secondary Insurance: 1D#: Group #:  SCHEDULE INFORMATION: Date: Friday 09/28/20 Time: Location:[facility]

## 2020-09-13 NOTE — Patient Instructions (Addendum)
Preventive Care 84-47 Years Old, Female Preventive care refers to lifestyle choices and visits with your health care provider that can promote health and wellness. This includes:  A yearly physical exam. This is also called an annual wellness visit.  Regular dental and eye exams.  Immunizations.  Screening for certain conditions.  Healthy lifestyle choices, such as: ? Eating a healthy diet. ? Getting regular exercise. ? Not using drugs or products that contain nicotine and tobacco. ? Limiting alcohol use. What can I expect for my preventive care visit? Physical exam Your health care provider will check your:  Height and weight. These may be used to calculate your BMI (body mass index). BMI is a measurement that tells if you are at a healthy weight.  Heart rate and blood pressure.  Body temperature.  Skin for abnormal spots. Counseling Your health care provider may ask you questions about your:  Past medical problems.  Family's medical history.  Alcohol, tobacco, and drug use.  Emotional well-being.  Home life and relationship well-being.  Sexual activity.  Diet, exercise, and sleep habits.  Work and work Statistician.  Access to firearms.  Method of birth control.  Menstrual cycle.  Pregnancy history. What immunizations do I need? Vaccines are usually given at various ages, according to a schedule. Your health care provider will recommend vaccines for you based on your age, medical history, and lifestyle or other factors, such as travel or where you work.   What tests do I need? Blood tests  Lipid and cholesterol levels. These may be checked every 5 years, or more often if you are over 3 years old.  Hepatitis C test.  Hepatitis B test. Screening  Lung cancer screening. You may have this screening every year starting at age 73 if you have a 30-pack-year history of smoking and currently smoke or have quit within the past 15 years.  Colorectal cancer  screening. ? All adults should have this screening starting at age 52 and continuing until age 17. ? Your health care provider may recommend screening at age 49 if you are at increased risk. ? You will have tests every 1-10 years, depending on your results and the type of screening test.  Diabetes screening. ? This is done by checking your blood sugar (glucose) after you have not eaten for a while (fasting). ? You may have this done every 1-3 years.  Mammogram. ? This may be done every 1-2 years. ? Talk with your health care provider about when you should start having regular mammograms. This may depend on whether you have a family history of breast cancer.  BRCA-related cancer screening. This may be done if you have a family history of breast, ovarian, tubal, or peritoneal cancers.  Pelvic exam and Pap test. ? This may be done every 3 years starting at age 10. ? Starting at age 11, this may be done every 5 years if you have a Pap test in combination with an HPV test. Other tests  STD (sexually transmitted disease) testing, if you are at risk.  Bone density scan. This is done to screen for osteoporosis. You may have this scan if you are at high risk for osteoporosis. Talk with your health care provider about your test results, treatment options, and if necessary, the need for more tests. Follow these instructions at home: Eating and drinking  Eat a diet that includes fresh fruits and vegetables, whole grains, lean protein, and low-fat dairy products.  Take vitamin and mineral supplements  as recommended by your health care provider.  Do not drink alcohol if: ? Your health care provider tells you not to drink. ? You are pregnant, may be pregnant, or are planning to become pregnant.  If you drink alcohol: ? Limit how much you have to 0-1 drink a day. ? Be aware of how much alcohol is in your drink. In the [place] one drink equals one 12 oz bottle of beer (355 mL), one 5 oz glass of  wine (148 mL), or one 1 oz glass of hard liquor (44 mL).   Lifestyle  Take daily care of your teeth and gums. Brush your teeth every morning and night with fluoride toothpaste. Floss one time each day.  Stay active. Exercise for at least 30 minutes 5 or more days each week.  Do not use any products that contain nicotine or tobacco, such as cigarettes, e-cigarettes, and chewing tobacco. If you need help quitting, ask your health care provider.  Do not use drugs.  If you are sexually active, practice safe sex. Use a condom or other form of protection to prevent STIs (sexually transmitted infections).  If you do not wish to become pregnant, use a form of birth control. If you plan to become pregnant, see your health care provider for a prepregnancy visit.  If told by your health care provider, take low-dose aspirin daily starting at age 33.  Find healthy ways to cope with stress, such as: ? Meditation, yoga, or listening to music. ? Journaling. ? Talking to a trusted person. ? Spending time with friends and family. Safety  Always wear your seat belt while driving or riding in a vehicle.  Do not drive: ? If you have been drinking alcohol. Do not ride with someone who has been drinking. ? When you are tired or distracted. ? While texting.  Wear a helmet and other protective equipment during sports activities.  If you have firearms in your house, make sure you follow all gun safety procedures. What's next?  Visit your health care provider once a year for an annual wellness visit.  Ask your health care provider how often you should have your eyes and teeth checked.  Stay up to date on all vaccines. This information is not intended to replace advice given to you by your health care provider. Make sure you discuss any questions you have with your health care provider. Document Revised: 03/27/2020 Document Reviewed: 03/04/2018 Elsevier Patient Education  2021 Elsevier  Inc.  Insomnia Insomnia is a sleep disorder that makes it difficult to fall asleep or stay asleep. Insomnia can cause fatigue, low energy, difficulty concentrating, mood swings, and poor performance at work or school. There are three different ways to classify insomnia:  Difficulty falling asleep.  Difficulty staying asleep.  Waking up too early in the morning. Any type of insomnia can be long-term (chronic) or short-term (acute). Both are common. Short-term insomnia usually lasts for three months or less. Chronic insomnia occurs at least three times a week for longer than three months. What are the causes? Insomnia may be caused by another condition, situation, or substance, such as:  Anxiety.  Certain medicines.  Gastroesophageal reflux disease (GERD) or other gastrointestinal conditions.  Asthma or other breathing conditions.  Restless legs syndrome, sleep apnea, or other sleep disorders.  Chronic pain.  Menopause.  Stroke.  Abuse of alcohol, tobacco, or illegal drugs.  Mental health conditions, such as depression.  Caffeine.  Neurological disorders, such as Alzheimer's disease.  An  overactive thyroid (hyperthyroidism). Sometimes, the cause of insomnia may not be known. What increases the risk? Risk factors for insomnia include:  Gender. Women are affected more often than men.  Age. Insomnia is more common as you get older.  Stress.  Lack of exercise.  Irregular work schedule or working night shifts.  Traveling between different time zones.  Certain medical and mental health conditions. What are the signs or symptoms? If you have insomnia, the main symptom is having trouble falling asleep or having trouble staying asleep. This may lead to other symptoms, such as:  Feeling fatigued or having low energy.  Feeling nervous about going to sleep.  Not feeling rested in the morning.  Having trouble concentrating.  Feeling irritable, anxious, or  depressed. How is this diagnosed? This condition may be diagnosed based on:  Your symptoms and medical history. Your health care provider may ask about: ? Your sleep habits. ? Any medical conditions you have. ? Your mental health.  A physical exam. How is this treated? Treatment for insomnia depends on the cause. Treatment may focus on treating an underlying condition that is causing insomnia. Treatment may also include:  Medicines to help you sleep.  Counseling or therapy.  Lifestyle adjustments to help you sleep better. Follow these instructions at home: Eating and drinking  Limit or avoid alcohol, caffeinated beverages, and cigarettes, especially close to bedtime. These can disrupt your sleep.  Do not eat a large meal or eat spicy foods right before bedtime. This can lead to digestive discomfort that can make it hard for you to sleep.   Sleep habits  Keep a sleep diary to help you and your health care provider figure out what could be causing your insomnia. Write down: ? When you sleep. ? When you wake up during the night. ? How well you sleep. ? How rested you feel the next day. ? Any side effects of medicines you are taking. ? What you eat and drink.  Make your bedroom a dark, comfortable place where it is easy to fall asleep. ? Put up shades or blackout curtains to block light from outside. ? Use a white noise machine to block noise. ? Keep the temperature cool.  Limit screen use before bedtime. This includes: ? Watching TV. ? Using your smartphone, tablet, or computer.  Stick to a routine that includes going to bed and waking up at the same times every day and night. This can help you fall asleep faster. Consider making a quiet activity, such as reading, part of your nighttime routine.  Try to avoid taking naps during the day so that you sleep better at night.  Get out of bed if you are still awake after 15 minutes of trying to sleep. Keep the lights down, but  try reading or doing a quiet activity. When you feel sleepy, go back to bed.   General instructions  Take over-the-counter and prescription medicines only as told by your health care provider.  Exercise regularly, as told by your health care provider. Avoid exercise starting several hours before bedtime.  Use relaxation techniques to manage stress. Ask your health care provider to suggest some techniques that may work well for you. These may include: ? Breathing exercises. ? Routines to release muscle tension. ? Visualizing peaceful scenes.  Make sure that you drive carefully. Avoid driving if you feel very sleepy.  Keep all follow-up visits as told by your health care provider. This is important. Contact a health care provider  if:  You are tired throughout the day.  You have trouble in your daily routine due to sleepiness.  You continue to have sleep problems, or your sleep problems get worse. Get help right away if:  You have serious thoughts about hurting yourself or someone else. If you ever feel like you may hurt yourself or others, or have thoughts about taking your own life, get help right away. You can go to your nearest emergency department or call:  Your local emergency services (911 in the [place]  A suicide crisis helpline, such as the [facility] at 316 723 8783. This is open 24 hours a day. Summary  Insomnia is a sleep disorder that makes it difficult to fall asleep or stay asleep.  Insomnia can be long-term (chronic) or short-term (acute).  Treatment for insomnia depends on the cause. Treatment may focus on treating an underlying condition that is causing insomnia.  Keep a sleep diary to help you and your health care provider figure out what could be causing your insomnia. This information is not intended to replace advice given to you by your health care provider. Make sure you discuss any questions you have with your health care  provider. Document Revised: 05/03/2020 Document Reviewed: 05/03/2020 Elsevier Patient Education  2021 [employer].

## 2020-09-13 NOTE — Progress Notes (Addendum)
Name: [name]   MRN: [id number]    DOB: Nov 24, 1975   Date:09/17/2020       Progress Note  Subjective  Chief Complaint  Annual Exam  HPI  Patient presents for annual CPE and follow up, she is aware of extra cost   Vertigo: symptoms in the past, seen by ENT and had Epley maneuver, she states symptoms more frequent lately with daily symptoms, she called ENT and has an appointment next week. She states tuning in bed triggers symptoms and head movements but lately has been oozy feeling all the time and noticing ear pressure  HTN: BP is at goal, she states did not take bp medication for the past two days, she forgot taking it. She has intermittent sharp chest pain that lasts a few seconds and resolves by itself, she states palpitation has improved  Headaches: usually nuchal area, feels tight, improves with heating pads a lot of times, we will try adding baclofen prn  Insomnia: she is stressed at work, 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 Goes to bed between 11 pm to 1 am. Discussed sleep hygiene , try melatonin.   Goiter: she was seen by Dr. Honor Junes, had repeat TSH that was trending up, she is taking levothyroxine daily, no change in bowel movement or palpitation   OSA: wearing it every day , she states daily fatigue not as intense   B12 deficiency: not taking supplements at this time, we will recheck level today   Vitamin D : she ran out of rx vitamin D and did not get otc supplementation   Diet: discussed enough calcium, avoid fast/fried food and eat more fish and tree nuts  Exercise: discussed 150 minutes per week   [place] Office Visit from 09/17/2020 in [facility]  AUDIT-C Score 1     Depression: Phq 9 is  negative Depression screen [facility] 2/9 09/17/2020 08/22/2019 06/07/2019 04/09/2018 11/17/2017  Decreased Interest 0 0 0 0 0  Down, Depressed, Hopeless 0 0 1 0 0  PHQ - 2 Score 0 0 1 0 0  Altered sleeping - 0 3 0 0   Tired, decreased energy - 0 0 0 1  Change in appetite - 0 0 0 0  Feeling bad or failure about yourself  - 0 0 0 0  Trouble concentrating - 0 1 0 0  Moving slowly or fidgety/restless - 0 0 0 0  Suicidal thoughts - 0 0 0 0  PHQ-9 Score - 0 5 0 1  Difficult doing work/chores - Not difficult at all Not difficult at all Not difficult at all Somewhat difficult   Hypertension: BP Readings from Last 3 Encounters:  09/17/20 128/87  02/02/20 (!) 145/88  10/31/19 (!) 156/91   Obesity: Wt Readings from Last 3 Encounters:  09/17/20 197 lb (89.4 kg)  02/02/20 194 lb 8 oz (88.2 kg)  10/31/19 190 lb (86.2 kg)   BMI Readings from Last 3 Encounters:  09/17/20 36.03 kg/m  02/02/20 35.57 kg/m  10/31/19 34.75 kg/m     Vaccines:   Pneumonia: educated and discussed with patient. Flu: educated and discussed with patient.  Hep C Screening: 04/09/18 STD testing and prevention (HIV/chl/gon/syphilis): 10/03/14, not interested  Intimate partner violence: negative Sexual History :married, one partner, sometimes has discomfort  Menstrual History/LMP/Abnormal Bleeding: regular cycles, discussed peri-menopause. Husband had vasectomy  Incontinence Symptoms: stress incontinence   Breast cancer:  - Last Mammogram: up to date  - BRCA gene screening: significant  family history of breast cancer, she had a negative genetic test done by GYN   Osteoporosis: Discussed high calcium and vitamin D supplementation, weight bearing exercises  Cervical cancer screening: 02/02/20  Skin cancer: Discussed monitoring for atypical lesions  Colorectal cancer: already scheduled , it was post pone secondary to husband having covid in the Fall  Lung cancer:  Low Dose CT Chest recommended if Age 61-80 years, 20 pack-year currently smoking OR have quit w/in 15years. Patient does not qualify.   ECG: 04/07/17  Advanced Care Planning: A voluntary discussion about advance care planning including the explanation and discussion  of advance directives.  Discussed health care proxy and Living will, and the patient was able to identify a health care proxy as husband     Lipids: Lab Results  Component Value Date   CHOL 188 10/31/2019   CHOL 193 04/09/2018   CHOL 169 04/21/2017   Lab Results  Component Value Date   HDL 47 10/31/2019   HDL 48 04/09/2018   HDL 50 04/21/2017   Lab Results  Component Value Date   LDLCALC 119 (H) 10/31/2019   LDLCALC 107 (H) 04/09/2018   LDLCALC 99 04/21/2017   Lab Results  Component Value Date   TRIG 122 10/31/2019   TRIG 191 (H) 04/09/2018   TRIG 98 04/21/2017   Lab Results  Component Value Date   CHOLHDL 4.0 10/31/2019   CHOLHDL 4.0 04/09/2018   CHOLHDL 3.4 04/21/2017   No results found for: LDLDIRECT  Glucose: Glucose  Date Value Ref Range Status  10/31/2019 86 65 - 99 mg/dL Final  04/09/2018 75 65 - 99 mg/dL Final    Comment:    Specimen received hemolyzed. Clinical correlation indicated.  04/21/2017 83 65 - 99 mg/dL Final    Patient Active Problem List   Diagnosis Date Noted  . OSA (obstructive sleep apnea) 11/17/2017  . Hypersomnia with sleep apnea 11/17/2017  . Chronic tension-type headache, not intractable 05/20/2017  . Essential hypertension 05/20/2017  . Intermittent low back pain 04/21/2017  . GERD (gastroesophageal reflux disease) 03/12/2016  . Goiter 03/12/2016  . MVP (mitral valve prolapse) 02/15/2015  . History of iron deficiency anemia 02/15/2015  . Vitamin D deficiency 01/11/2015  . Vitamin B12 deficiency 01/11/2015  . Heart murmur 12/29/2011  . Fibroid uterus 12/29/2011  . Family history of breast cancer 12/29/2011    Past Surgical History:  Procedure Laterality Date  . BREAST BIOPSY Left 11/17/2019   Affirm bx-"X" clip-path pending  . LEEP    . WISDOM TOOTH EXTRACTION     x4    Family History  Problem Relation Age of Onset  . Cancer Mother 58       deceased at age 6 (breast)  . Diabetes Mother   . Hypertension Mother    . Breast cancer Mother 18  . Cancer Maternal Aunt 32       breast  . Breast cancer Maternal Aunt 31  . Cancer Maternal Grandmother 70       pancreatic  . Cancer Maternal Grandfather 67       throat  . Hypertension Father   . Cancer Father 34       prostate  . Hypertension Brother   . Heart disease Paternal Grandmother        heart attack  . Heart disease Paternal Grandfather        heart attack.    Social History   Socioeconomic History  . Marital status: Married  Spouse name: Erick  . Number of children: 5  . Years of education: Not on file  . Highest education level: Not on file  Occupational History  . Occupation: billing   Tobacco Use  . Smoking status: Never Smoker  . Smokeless tobacco: Never Used  Vaping Use  . Vaping Use: Never used  Substance and Sexual Activity  . Alcohol use: No  . Drug use: No  . Sexual activity: Yes    Partners: Male    Birth control/protection: Surgical    Comment: vasectomy  Other Topics Concern  . Not on file  Social History Narrative   Married, 5 children, one is grown and out of the house.    Youngest born 2008.   Works full time at [employer] to school for health care information - online, graduates in 2019   Social Determinants of Health   Financial Resource Strain: [place]   . Difficulty of Paying Living Expenses: Not hard at all  Food Insecurity: No Food Insecurity  . Worried About Charity fundraiser in the Last Year: Never true  . Ran Out of Food in the Last Year: Never true  Transportation Needs: No Transportation Needs  . Lack of Transportation (Medical): No  . Lack of Transportation (Non-Medical): No  Physical Activity: Insufficiently Active  . Days of Exercise per Week: 1 day  . Minutes of Exercise per Session: 30 min  Stress: Stress Concern Present  . Feeling of Stress : To some extent  Social Connections: Moderately Isolated  . Frequency of Communication with Friends and Family: Once a week  .  Frequency of Social Gatherings with Friends and Family: Once a week  . Attends Religious Services: More than 4 times per year  . Active Member of Clubs or Organizations: No  . Attends Archivist Meetings: Never  . Marital Status: Married  Human resources officer Violence: Not At Risk  . Fear of Current or Ex-Partner: No  . Emotionally Abused: No  . Physically Abused: No  . Sexually Abused: No     Current Outpatient Medications:  .  baclofen (LIORESAL) 10 MG tablet, Take 1 tablet (10 mg total) by mouth 3 (three) times daily., Disp: 30 each, Rfl: 0 .  Cholecalciferol (VITAMIN D) 2000 units CAPS, Take by mouth., Disp: , Rfl:  .  Cyanocobalamin (B-12 PO), Take by mouth., Disp: , Rfl:  .  fluticasone (FLONASE) 50 MCG/ACT nasal spray, Place 1 spray into both nostrils as needed., Disp: 48 g, Rfl: 1 .  levothyroxine (SYNTHROID) 50 MCG tablet, Take by mouth., Disp: , Rfl:  .  metoprolol succinate (TOPROL-XL) 25 MG 24 hr tablet, Take 1 tablet (25 mg total) by mouth daily., Disp: 90 tablet, Rfl: 0  No Known Allergies   ROS  Constitutional: Negative for fever or weight change.  Respiratory: Negative for cough and shortness of breath.   Cardiovascular: Negative for chest pain or palpitations.  Gastrointestinal: Negative for abdominal pain, no bowel changes.  Musculoskeletal: Negative for gait problem or joint swelling.  Skin: Negative for rash.  Neurological: positive  for dizziness and  headache.  No other specific complaints in a complete review of systems (except as listed in HPI above).  Objective  Vitals:   09/17/20 1412  BP: 128/87  Pulse: 87  Resp: 16  Temp: 98.7 F (37.1 C)  TempSrc: Oral  SpO2: 99%  Weight: 197 lb (89.4 kg)  Height: 5' 2"  (1.575 m)    Body mass  index is 36.03 kg/m.  Physical Exam  Constitutional: Patient appears well-developed and well-nourished. No distress.  HENT: Head: Normocephalic and atraumatic. Ears: B TMs ok, no erythema or effusion;  Nose: Not done  Mouth/Throat: not done  Eyes: Conjunctivae and EOM are normal. Pupils are equal, round, and reactive to light. No scleral icterus. , large goiter  Neck: Normal range of motion. Neck supple. No JVD present.  Cardiovascular: Normal rate, regular rhythm and normal heart sounds.  No murmur heard. No BLE edema. Pulmonary/Chest: Effort normal and breath sounds normal. No respiratory distress. Abdominal: Soft. Bowel sounds are normal, no distension. There is no tenderness. no masses Breast: no lumps or masses, no nipple discharge or rashes FEMALE GENITALIA:  Not done RECTAL:not done  Musculoskeletal: Normal range of motion, no joint effusions. No gross deformities Neurological: he is alert and oriented to person, place, and time. No cranial nerve deficit. Coordination, balance, strength, speech and gait are normal.  Skin: Skin is warm and dry. No rash noted. No erythema.  Psychiatric: Patient has a normal mood and affect. behavior is normal. Judgment and thought content normal.  Fall Risk: Fall Risk  09/17/2020 08/22/2019 06/07/2019 04/09/2018 11/17/2017  Falls in the past year? 0 0 0 No No  Number falls in past yr: 0 0 0 - -  Injury with Fall? 0 0 0 - -     Functional Status Survey: Is the patient deaf or have difficulty hearing?: No Does the patient have difficulty seeing, even when wearing glasses/contacts?: No Does the patient have difficulty concentrating, remembering, or making decisions?: No Does the patient have difficulty walking or climbing stairs?: No Does the patient have difficulty dressing or bathing?: No Does the patient have difficulty doing errands alone such as visiting a doctor's office or shopping?: No   Assessment & Plan  1. Essential hypertension  - CBC with Differential/Platelet - Comprehensive metabolic panel  2. Need for immunization against influenza  - Flu Vaccine QUAD 36+ mos IM  3. Colon cancer screening  Has an appointment   4. Need for  Tdap vaccination  tdap today   5. Chronic tension-type headache, not intractable  She uses heating pad prn   6. Goiter  Keep follow up with Dr. Honor Junes   7. Vitamin D deficiency  - VITAMIN D 25 Hydroxy (Vit-D Deficiency, Fractures)  8. OSA (obstructive sleep apnea)   9. Vitamin B12 deficiency  - Vitamin B12  10. Dyslipidemia  - Lipid panel  11. Hypersomnia with sleep apnea  Doing better   12. MVP (mitral valve prolapse)   13. Benign paroxysmal positional vertigo, unspecified laterality  Keep follow up with ENT  14. Diabetes mellitus screening  - Hemoglobin A1c   15. Insomnia, unspecified type  Discussed life style modifications, meditation and consider otc melatonin first   16. Well adult exam   -USPSTF grade A and B recommendations reviewed with patient; age-appropriate recommendations, preventive care, screening tests, etc discussed and encouraged; healthy living encouraged; see AVS for patient education given to patient -Discussed importance of 150 minutes of physical activity weekly, eat two servings of fish weekly, eat one serving of tree nuts ( cashews, pistachios, pecans, almonds.Marland Kitchen) every other day, eat 6 servings of fruit/vegetables daily and drink plenty of water and avoid sweet beverages.

## 2020-09-17 ENCOUNTER — Other Ambulatory Visit: Payer: Self-pay

## 2020-09-17 ENCOUNTER — Encounter: Payer: Self-pay | Admitting: Family Medicine

## 2020-09-17 ENCOUNTER — Ambulatory Visit: Payer: Commercial Managed Care - PPO | Admitting: Family Medicine

## 2020-09-17 VITALS — BP 128/87 | HR 87 | Temp 98.7°F | Resp 16 | Ht 62.0 in | Wt 197.0 lb

## 2020-09-17 DIAGNOSIS — I341 Nonrheumatic mitral (valve) prolapse: Secondary | ICD-10-CM

## 2020-09-17 DIAGNOSIS — Z23 Encounter for immunization: Secondary | ICD-10-CM | POA: Diagnosis not present

## 2020-09-17 DIAGNOSIS — E538 Deficiency of other specified B group vitamins: Secondary | ICD-10-CM

## 2020-09-17 DIAGNOSIS — E785 Hyperlipidemia, unspecified: Secondary | ICD-10-CM

## 2020-09-17 DIAGNOSIS — G4710 Hypersomnia, unspecified: Secondary | ICD-10-CM

## 2020-09-17 DIAGNOSIS — Z1211 Encounter for screening for malignant neoplasm of colon: Secondary | ICD-10-CM

## 2020-09-17 DIAGNOSIS — G4730 Sleep apnea, unspecified: Secondary | ICD-10-CM

## 2020-09-17 DIAGNOSIS — Z0000 Encounter for general adult medical examination without abnormal findings: Secondary | ICD-10-CM

## 2020-09-17 DIAGNOSIS — E559 Vitamin D deficiency, unspecified: Secondary | ICD-10-CM

## 2020-09-17 DIAGNOSIS — I10 Essential (primary) hypertension: Secondary | ICD-10-CM

## 2020-09-17 DIAGNOSIS — H8110 Benign paroxysmal vertigo, unspecified ear: Secondary | ICD-10-CM

## 2020-09-17 DIAGNOSIS — G44229 Chronic tension-type headache, not intractable: Secondary | ICD-10-CM | POA: Diagnosis not present

## 2020-09-17 DIAGNOSIS — Z131 Encounter for screening for diabetes mellitus: Secondary | ICD-10-CM

## 2020-09-17 DIAGNOSIS — G4733 Obstructive sleep apnea (adult) (pediatric): Secondary | ICD-10-CM

## 2020-09-17 DIAGNOSIS — E049 Nontoxic goiter, unspecified: Secondary | ICD-10-CM

## 2020-09-17 DIAGNOSIS — G4700 Insomnia, unspecified: Secondary | ICD-10-CM

## 2020-09-17 MED ORDER — BACLOFEN 10 MG PO TABS: 10.0000 mg | ORAL_TABLET | Freq: Three times a day (TID) | ORAL | 0 refills | Status: DC

## 2020-09-25 ENCOUNTER — Telehealth: Payer: Self-pay

## 2020-09-25 NOTE — Telephone Encounter (Signed)
Returned patients call. LVM to call office back. 

## 2020-09-26 ENCOUNTER — Other Ambulatory Visit: Admission: RE | Admit: 2020-09-26 | Payer: Commercial Managed Care - PPO | Source: Ambulatory Visit

## 2020-09-28 ENCOUNTER — Encounter: Admission: RE | Payer: Self-pay | Source: Home / Self Care

## 2020-09-28 ENCOUNTER — Ambulatory Visit
Admission: RE | Admit: 2020-09-28 | Payer: Commercial Managed Care - PPO | Source: Home / Self Care | Admitting: Gastroenterology

## 2020-09-28 SURGERY — COLONOSCOPY WITH PROPOFOL
Anesthesia: General

## 2020-10-10 ENCOUNTER — Other Ambulatory Visit: Payer: Self-pay | Admitting: Unknown Physician Specialty

## 2020-10-10 DIAGNOSIS — R42 Dizziness and giddiness: Secondary | ICD-10-CM

## 2020-10-10 DIAGNOSIS — E049 Nontoxic goiter, unspecified: Secondary | ICD-10-CM

## 2020-10-10 LAB — CBC WITH DIFFERENTIAL/PLATELET
Basophils Absolute: 0 10*3/uL (ref 0.0–0.2)
Basos: 1 %
EOS (ABSOLUTE): 0.1 10*3/uL (ref 0.0–0.4)
Eos: 2 %
Hematocrit: 39.4 % (ref 34.0–46.6)
Hemoglobin: 12.7 g/dL (ref 11.1–15.9)
Immature Grans (Abs): 0 10*3/uL (ref 0.0–0.1)
Immature Granulocytes: 0 %
Lymphocytes Absolute: 2.6 10*3/uL (ref 0.7–3.1)
Lymphs: 43 %
MCH: 27.4 pg (ref 26.6–33.0)
MCHC: 32.2 g/dL (ref 31.5–35.7)
MCV: 85 fL (ref 79–97)
Monocytes Absolute: 0.7 10*3/uL (ref 0.1–0.9)
Monocytes: 11 %
Neutrophils Absolute: 2.6 10*3/uL (ref 1.4–7.0)
Neutrophils: 43 %
Platelets: 311 10*3/uL (ref 150–450)
RBC: 4.64 x10E6/uL (ref 3.77–5.28)
RDW: 13 % (ref 11.7–15.4)
WBC: 6 10*3/uL (ref 3.4–10.8)

## 2020-10-10 LAB — LIPID PANEL
Chol/HDL Ratio: 4.6 ratio — ABNORMAL HIGH (ref 0.0–4.4)
Cholesterol, Total: 206 mg/dL — ABNORMAL HIGH (ref 100–199)
HDL: 45 mg/dL
LDL Chol Calc (NIH): 130 mg/dL — ABNORMAL HIGH (ref 0–99)
Triglycerides: 176 mg/dL — ABNORMAL HIGH (ref 0–149)
VLDL Cholesterol Cal: 31 mg/dL (ref 5–40)

## 2020-10-10 LAB — VITAMIN D 25 HYDROXY (VIT D DEFICIENCY, FRACTURES): Vit D, 25-Hydroxy: 17.6 ng/mL — ABNORMAL LOW (ref 30.0–100.0)

## 2020-10-10 LAB — COMPREHENSIVE METABOLIC PANEL WITH GFR
ALT: 19 [IU]/L (ref 0–32)
AST: 16 [IU]/L (ref 0–40)
Albumin/Globulin Ratio: 1.5 (ref 1.2–2.2)
Albumin: 4.1 g/dL (ref 3.8–4.8)
Alkaline Phosphatase: 69 [IU]/L (ref 44–121)
BUN/Creatinine Ratio: 11 (ref 9–23)
BUN: 9 mg/dL (ref 6–24)
Bilirubin Total: 0.5 mg/dL (ref 0.0–1.2)
CO2: 19 mmol/L — ABNORMAL LOW (ref 20–29)
Calcium: 9.1 mg/dL (ref 8.7–10.2)
Chloride: 105 mmol/L (ref 96–106)
Creatinine, Ser: 0.83 mg/dL (ref 0.57–1.00)
Globulin, Total: 2.8 g/dL (ref 1.5–4.5)
Glucose: 85 mg/dL (ref 65–99)
Potassium: 4.2 mmol/L (ref 3.5–5.2)
Sodium: 141 mmol/L (ref 134–144)
Total Protein: 6.9 g/dL (ref 6.0–8.5)
eGFR: 88 mL/min/{1.73_m2}

## 2020-10-10 LAB — VITAMIN B12: Vitamin B-12: 456 pg/mL (ref 232–1245)

## 2020-10-10 LAB — HEMOGLOBIN A1C
Est. average glucose Bld gHb Est-mCnc: 103 mg/dL
Hgb A1c MFr Bld: 5.2 % (ref 4.8–5.6)

## 2020-10-21 ENCOUNTER — Ambulatory Visit
Admission: RE | Admit: 2020-10-21 | Discharge: 2020-10-21 | Disposition: A | Discharge status: Home / Self Care | Payer: Commercial Managed Care - PPO | Source: Ambulatory Visit | Attending: Unknown Physician Specialty | Admitting: Unknown Physician Specialty

## 2020-10-21 ENCOUNTER — Other Ambulatory Visit: Payer: Self-pay

## 2020-10-21 DIAGNOSIS — R42 Dizziness and giddiness: Secondary | ICD-10-CM | POA: Insufficient documentation

## 2020-10-21 MED ORDER — GADOBUTROL 1 MMOL/ML IV SOLN
9.0000 mL | Freq: Once | INTRAVENOUS | Status: AC | PRN
  Administered 2020-10-21: 9 mL via INTRAVENOUS

## 2020-11-06 ENCOUNTER — Other Ambulatory Visit: Payer: Self-pay

## 2020-11-06 ENCOUNTER — Encounter: Payer: Self-pay | Admitting: Physical Therapy

## 2020-11-06 ENCOUNTER — Ambulatory Visit: Payer: Commercial Managed Care - PPO | Attending: Unknown Physician Specialty | Admitting: Physical Therapy

## 2020-11-06 DIAGNOSIS — R2681 Unsteadiness on feet: Secondary | ICD-10-CM | POA: Insufficient documentation

## 2020-11-06 DIAGNOSIS — R42 Dizziness and giddiness: Secondary | ICD-10-CM | POA: Diagnosis present

## 2020-11-06 NOTE — Therapy (Signed)
[place] MAIN [facility] SERVICES [address] [place], [place], [address] Phone: [phone]   Fax:  [phone]  Physical Therapy Evaluation  Patient Details  Name: [name] MRN: [id number] Date of Birth: 11-22-1975 Referring Provider (PT): Dr. Gemma Payor   Encounter Date: 11/06/2020   PT End of Session - 11/06/20 0901    Visit Number 1    Number of Visits 13    Date for PT Re-Evaluation 01/29/21    Authorization Type Eval date: 11/06/20    PT Start Time 0902    PT Stop Time 0953    PT Time Calculation (min) 51 min    Equipment Utilized During Treatment Gait belt    Activity Tolerance Patient tolerated treatment well    Behavior During Therapy [facility] for tasks assessed/performed           Past Medical History:  Diagnosis Date  . Abnormal Pap smear   . Anemia    during pregnancy  . GERD (gastroesophageal reflux disease)   . Heart murmur   . Hypertension    boderline  . MVP (mitral valve prolapse)   . Obesity   . Uterine fibroid     Past Surgical History:  Procedure Laterality Date  . BREAST BIOPSY Left 11/17/2019   Affirm bx-"X" clip-path pending  . LEEP    . WISDOM TOOTH EXTRACTION     x4    There were no vitals filed for this visit.    Subjective Assessment - 11/07/20 1012    Subjective Pt states that she started having dizziness about 2-3 months ago and that it has not changed much since onset. Pt states she mostly feels off-balance and unsteady at times.    Pertinent History History obtained from patient report and medical chart review.   Patient reports the dizziness started about 2-3 months ago. Pt reports it was a sudden onset and has been happening everyday since. Patient describes her dizziness as unsteadiness/off-balance,  lightheadedness, aural fullness and vertigo. Patient reports that she is not getting vertigo often, but occurs sometimes when she is rolling over in bed. Patient states that she has a  history of vertigo that began about 4 years ago and patient reports "it comes and goes". Pt reports nothing brings on her dizziness and states nothing makes it better. However, patient reports bending over at times brings on the dizziness. Patient reports she does not use Meclizine. Patient had Dix-Hallpike testing at Dr. Ileene Hutchinson office which was negative per medical record and a VNG was ordered. Patient reports that she has had the VNG test and that it showed that her right ear was functioning at 68% per patient report. Per medical record, patient had Covid in October 2021 and has had some imbalance since that time.    Patient Stated Goals Pt would like to have decreased unsteadiness and to be able to work without dizziness as patient reports working on multiple computer screens at work can bring on her dizziness symptoms.    Currently in Pain? Other (Comment)   none stated             [facility] PT Assessment - 11/06/20 0905      Assessment   Medical Diagnosis Dizziness and giddiness    Referring Provider (PT) Dr. Gemma Payor      Home Environment   Living Environment Private residence    Type of [place] to enter    Entrance  Stairs-Number of Steps 12    Entrance Stairs-Rails Right      Standardized Balance Assessment   Standardized Balance Assessment Dynamic Gait Index      Dynamic Gait Index   Level Surface Mild Impairment    Change in Gait Speed Moderate Impairment    Gait with Horizontal Head Turns Mild Impairment    Gait with Vertical Head Turns Normal    Gait and Pivot Turn Normal   reports mild dizziness both directions of turns   Step Over Obstacle Mild Impairment    Step Around Obstacles Normal    Steps Mild Impairment    Total Score 18            VESTIBULAR AND BALANCE EVALUATION   HISTORY:  Subjective history of current problem: History obtained from patient report and medical chart review.  Patient reports the dizziness started about 2-3  months ago. Pt reports it was a sudden onset and has been happening everyday since. Patient describes her dizziness as unsteadiness/off-balance,  lightheadedness, aural fullness and vertigo. Patient reports that she is not getting vertigo often, but occurs sometimes when she is rolling over in bed. Patient states that she has a history of vertigo that began about 4 years ago and patient reports "it comes and goes". Pt reports nothing brings on her dizziness and states nothing makes it better. However, patient reports bending over at times brings on the dizziness. Patient reports she does not use Meclizine. Patient had Dix-Hallpike testing at Dr. Ileene Hutchinson office which was negative per medical record and a VNG was ordered. Patient reports that she has had the VNG test and that it showed that her right ear was functioning at 68% per patient report. Per medical record, patient had Covid in October 2021 and has had some imbalance since that time. Pt reports she has a spin bike for exercise and that she is able to do that without symptoms.   Description of dizziness: vertigo, unsteadiness, lightheadedness, aural fullness Frequency: daily  Duration: seconds Symptom nature: motion provoked, positional, spontaneous, intermittent Progression of symptoms: no change since onset  Falls (yes/no): no Number of falls in past 6 months: 0  Auditory complaints (tinnitus, pain, drainage): reports mild ear pain right greater than left; denies drainage; states she has noted more tinnitus in right ear.  Vision (last eye exam, diplopia, recent changes): Pt wears glasses. Pt reports her vision has gotten worse and states she puts reading glasses over her reading glasses. Pt states the last eye exam was 1 year ago. Recommended that she get her annual eye exam.   EXAMINATION  SOMATOSENSORY:  Any N & T in extremities or weakness: reports left arm up to mid-humerous and left hand up to mid forearm. Pt right hand dominant.        COORDINATION: Finger to Nose: Right Normal; Left very mild pass pointing/Dysmetric Past Pointing:  Left, Right Normal  MUSCULOSKELETAL SCREEN: Cervical Spine ROM: AROM cervical spine WNL without pain  Gait: Patient arrives ambulating without AD. Patient ambulates with reciprocal arm swing with fair cadence with step through gait pattern.  Scanning of visual environment with gait is: fair  Balance: Patient is challenged by narrow base of support, uneven surfaces, eyes closed, single leg stance and activities with head turns.  POSTURAL CONTROL TESTS:  Clinical Test of Sensory Interaction for Balance (CTSIB): CONDITION TIME STRATEGY SWAY  Eyes open, firm surface 30 seconds ankle   Eyes closed, firm surface 30 seconds ankle +1  Eyes open,  foam surface 30 seconds ankle +1  Eyes closed, foam surface 30 seconds ankle +3    OCULOMOTOR / VESTIBULAR TESTING:  Oculomotor Exam- Room Light  Normal Abnormal Comments  Ocular Alignment N    Ocular ROM N    Spontaneous Nystagmus N    Gaze evoked Nystagmus N    Smooth Pursuit N    Saccades N    VOR     VOR Cancellation N  denies  Left Head Impulse N    Right Head Impulse N    Head Shaking Nystagmus  Abn 3/10 did not observe nystagmus in room light    BPPV TESTS:  Symptoms Duration Intensity Nystagmus  Left Dix-Hallpike None   None observed  Right Dix-Hallpike None   None observed    FUNCTIONAL OUTCOME MEASURES:  Results Comments  DHI 42/100 Moderate perception of handicap; in need of intervention  ABC Scale 36.2% Falls risk; in need of intervention  DGI 18/24 Falls risk; in need of intervention  FOTO 50/100 Given the patient's risk adjustment variables, like-patients nationally had a FS score of  52/100 at intake    Neuromuscular Re-education:  VOR X 1 exercise:  Demonstrated and educated as to VOR X1.  Patient performed VOR X 1 horizontal in sitting 1 rep of 30 seconds and 1 rep of 1 minute with verbal cues for technique.   Patient performed VOR X 1 horizontal in standing 1 rep of 1 minute demonstrating good technique.  Patient reports the target is staying in focus and reports no dizziness, but did not slight sway.  Issued VOR X 1 in standing for HEP.   Airex pad:  On Airex pad, patient performed feet together and semi-tandem progressions with alternating lead leg with and without body turns and horizontal and vertical head turns with SBA.  Patient demonstrates mild unsteadiness with these activities. Issued for HEP.     PT Education - 11/06/20 0901    Education Details Issued vestibular PowerPoint exercies slides 1,2,3,4: VOR X 1 in standing with plain background, standing on pillow feet together and semi-tandem progressions with horizontal and vertical head turns and body turns.    Person(s) Educated Patient    Methods Explanation;Demonstration;Verbal cues;Handout    Comprehension Verbalized understanding;Returned demonstration            PT Short Term Goals - 11/06/20 0903      PT SHORT TERM GOAL #1   Title Pt will be independent with HEP in order to improve balance and decrease dizziness symptoms in order to decrease fall risk and improve function at home and work.    Time 4    Period Weeks    Status New    Target Date 12/04/20             PT Long Term Goals - 11/06/20 0903      PT LONG TERM GOAL #1   Title Patient will have improved FOTO score of 6 points or greater in order to demonstrate improvements in patient's ADLs and functional performance.    Baseline Scored 50/100 on 11/06/20;    Time 12    Period Weeks    Status New    Target Date 01/29/21      PT LONG TERM GOAL #2   Title Patient will reduce perceived disability to low levels as indicated by <40 on Dizziness Handicap Inventory    Baseline Scored 42/100 on 11/06/20    Time 12    Period Weeks    Status  New    Target Date 01/29/21      PT LONG TERM GOAL #3   Title Patient will reduce falls risk as indicated by Activities  Specific Balance Confidence Scale (ABC) >67%.    Baseline Scored 36.2% on 11/06/20;    Time 12    Period Weeks    Status New    Target Date 01/29/21      PT LONG TERM GOAL #4   Title Patient will demonstrate reduced falls risk as evidenced by Dynamic Gait Index (DGI) >19/24.    Baseline Scored 18/24 on 11/06/20;    Time 12    Period Weeks    Status New    Target Date 01/29/21      PT LONG TERM GOAL #5   Title Patient will report 50% or greater improvement in her symptoms of dizziness and imbalance with provoking motions or positions    Baseline Pt reports that she gets dizziness when she rolls over, bends down and when at work looking at multiple computer screens. Patient also reports unsteadiness on her feet;    Time 12    Period Weeks    Status New    Target Date 01/29/21                  Plan - 11/06/20 0902    Clinical Impression Statement Patient presents with report of 2-3 month history of dizziness with unsteadiness. Patient with negative Dix-Hallpike testing. Head shaking nystagmus test brought on 3/10 dizziness per patient report but no nystagmus was observed in room light. Patient scored 42/100 on the [place] indicating moderate perception of handicap and 36% on the ABC scale which indicates falls risk. Patient scored 18/24 on the DGI indicating falls risk and scored 50/100 on the FOTO outcome survey indicating impairment with daily functional activities. Patient would benefit from vestibular PT services to try to address functional deficits and goals as set on plan of care.    Personal Factors and Comorbidities Comorbidity 3+    Comorbidities HTN, sleep apnea, chronic tension type headaches    Examination-Activity Limitations Bend;Stairs    Examination-Participation Restrictions Other   computer screens at work   Stability/Clinical Decision Making Evolving/Moderate complexity    Clinical Decision Making Moderate    Rehab Potential Good    PT Frequency 1x / week    PT  Duration 12 weeks    PT Treatment/Interventions Canalith Repostioning;Patient/family education;Vestibular;Balance training;Neuromuscular re-education    PT Next Visit Plan Review HEP, consider progressing to conflicting background, Airex pad EO/EC with and without head turns, body wall rolls, ambulation with head turns forward and retro ambulation    PT Home Exercise Plan VOR X 1 in standing with plain background, feet together and semi-tandem progressions on pillow with head turns vertical and horizontal and body turns.    Consulted and Agree with Plan of Care Patient           Patient will benefit from skilled therapeutic intervention in order to improve the following deficits and impairments:  Dizziness,Decreased balance  Visit Diagnosis: Dizziness and giddiness  Unsteadiness on feet     Problem List Patient Active Problem List   Diagnosis Date Noted  . OSA (obstructive sleep apnea) 11/17/2017  . Hypersomnia with sleep apnea 11/17/2017  . Chronic tension-type headache, not intractable 05/20/2017  . Essential hypertension 05/20/2017  . Intermittent low back pain 04/21/2017  . GERD (gastroesophageal reflux disease) 03/12/2016  . Goiter 03/12/2016  . MVP (mitral valve prolapse) 02/15/2015  .  History of iron deficiency anemia 02/15/2015  . Vitamin D deficiency 01/11/2015  . Vitamin B12 deficiency 01/11/2015  . Heart murmur 12/29/2011  . Fibroid uterus 12/29/2011  . Family history of breast cancer 12/29/2011   Lady Deutscher PT, DPT [phone] Lady Deutscher 11/07/2020, 10:25 AM  [place] MAIN [facility] SERVICES [address] [place], [place], [address] Phone: [phone]   Fax:  [phone]  Name: [name] MRN: [id number] Date of Birth: 02-06-1976

## 2020-11-10 ENCOUNTER — Other Ambulatory Visit: Payer: Self-pay | Admitting: Family Medicine

## 2020-11-10 DIAGNOSIS — I10 Essential (primary) hypertension: Secondary | ICD-10-CM

## 2020-11-10 DIAGNOSIS — G44229 Chronic tension-type headache, not intractable: Secondary | ICD-10-CM

## 2020-11-12 ENCOUNTER — Other Ambulatory Visit: Payer: Self-pay | Admitting: Family Medicine

## 2020-11-12 DIAGNOSIS — G44229 Chronic tension-type headache, not intractable: Secondary | ICD-10-CM

## 2020-11-12 DIAGNOSIS — I10 Essential (primary) hypertension: Secondary | ICD-10-CM

## 2020-11-12 MED ORDER — METOPROLOL SUCCINATE ER 25 MG PO TB24: 25.0000 mg | ORAL_TABLET | Freq: Every day | ORAL | 0 refills | Status: DC

## 2020-11-13 ENCOUNTER — Encounter: Payer: Self-pay | Admitting: Physical Therapy

## 2020-11-13 ENCOUNTER — Ambulatory Visit: Payer: Commercial Managed Care - PPO | Admitting: Physical Therapy

## 2020-11-13 ENCOUNTER — Other Ambulatory Visit: Payer: Self-pay

## 2020-11-13 DIAGNOSIS — R42 Dizziness and giddiness: Secondary | ICD-10-CM | POA: Diagnosis not present

## 2020-11-13 DIAGNOSIS — R2681 Unsteadiness on feet: Secondary | ICD-10-CM

## 2020-11-13 NOTE — Therapy (Signed)
[place] MAIN [facility] SERVICES [address] [place], [place], [address] Phone: [phone]   Fax:  [phone]  Physical Therapy Treatment  Patient Details  Name: [name] MRN: [id number] Date of Birth: 02/15/1976 Referring Provider (PT): Dr. Gemma Payor   Encounter Date: 11/13/2020   PT End of Session - 11/13/20 1019    Visit Number 2    Number of Visits 13    Date for PT Re-Evaluation 01/29/21    Authorization Type Eval date: 11/06/20    PT Start Time 1018    PT Stop Time 1101    PT Time Calculation (min) 43 min    Equipment Utilized During Treatment Gait belt    Activity Tolerance Patient tolerated treatment well    Behavior During Therapy [facility] for tasks assessed/performed           Past Medical History:  Diagnosis Date  . Abnormal Pap smear   . Anemia    during pregnancy  . GERD (gastroesophageal reflux disease)   . Heart murmur   . Hypertension    boderline  . MVP (mitral valve prolapse)   . Obesity   . Uterine fibroid     Past Surgical History:  Procedure Laterality Date  . BREAST BIOPSY Left 11/17/2019   Affirm bx-"X" clip-path pending  . LEEP    . WISDOM TOOTH EXTRACTION     x4    There were no vitals filed for this visit.   Subjective Assessment - 11/13/20 1021    Subjective Patient states that she did not have much dizziness this past week. Patient reports she did the VOR X 1 exercise and states it recreated mild dizziness.    Pertinent History History obtained from patient report and medical chart review.   Patient reports the dizziness started about 2-3 months ago. Pt reports it was a sudden onset and has been happening everyday since. Patient describes her dizziness as unsteadiness/off-balance,  lightheadedness, aural fullness and vertigo. Patient reports that she is not getting vertigo often, but occurs sometimes when she is rolling over in bed. Patient states that she has a history of vertigo that  began about 4 years ago and patient reports "it comes and goes". Pt reports nothing brings on her dizziness and states nothing makes it better. However, patient reports bending over at times brings on the dizziness. Patient reports she does not use Meclizine. Patient had Dix-Hallpike testing at Dr. Ileene Hutchinson office which was negative per medical record and a VNG was ordered. Patient reports that she has had the VNG test and that it showed that her right ear was functioning at 68% per patient report. Per medical record, patient had Covid in October 2021 and has had some imbalance since that time.    Patient Stated Goals Pt would like to have decreased unsteadiness and to be able to work without dizziness as patient reports working on multiple computer screens at work can bring on her dizziness symptoms.           Neuromuscular Re-education:  VOR X 1 exercise:  Patient performed VOR X 1 horizontal in standing with plain background 1 rep of 1 minute and then with conflicting background 2 reps of 1 minute each. Patient demonstrates good technique.  Patient denies dizziness with this activity with plain background and reports 2/10 dizziness with conflicting background.   Body Wall Rolls:  Patient performed 5 reps of supported, body wall rolls with eyes open and then 4 reps  of body wall rolls with eyes closed with CGA.  Patient denies dizziness, but reports unsteadiness with this activity.   Airex pad:  On Airex pad, patient performed feet together and semi-tandem progressions with alternating lead leg with and without horizontal, vertical and diagonal head turns with CGA.  Patient denies dizziness, but reports mild unsteadiness.   Ambulation with head turns:  Patient performed 175' trials of forwards and retro ambulation with horizontal and vertical head turns with CGA.   Patient demonstrates mild veering at times  Patient performed ball toss to self horizontal and vertical with head and eye  tracking with CGA.   Hallway ball toss:  In hallway, worked on ball toss against one wall with alternating quick turns to toss ball against opposite wall while tracking with eyes and head.  Note: Patient denies dizziness with activities except for VOR X 1 with conflicting backgrounds, but reports unsteadiness sensation.      PT Education - 11/13/20 1019    Education Details Reviewed home exercise program and added conflicting background progression to the VORX1, diagonal head turns with activities on pillow and added body wall rolls to HEP. Issued vestibular handout slides 1,2,3,4    Person(s) Educated Patient    Methods Explanation;Demonstration;Verbal cues;Handout    Comprehension Verbalized understanding;Returned demonstration            PT Short Term Goals - 11/06/20 0903      PT SHORT TERM GOAL #1   Title Pt will be independent with HEP in order to improve balance and decrease dizziness symptoms in order to decrease fall risk and improve function at home and work.    Time 4    Period Weeks    Status New    Target Date 12/04/20             PT Long Term Goals - 11/06/20 0903      PT LONG TERM GOAL #1   Title Patient will have improved FOTO score of 6 points or greater in order to demonstrate improvements in patient's ADLs and functional performance.    Baseline Scored 50/100 on 11/06/20;    Time 12    Period Weeks    Status New    Target Date 01/29/21      PT LONG TERM GOAL #2   Title Patient will reduce perceived disability to low levels as indicated by <40 on Dizziness Handicap Inventory    Baseline Scored 42/100 on 11/06/20    Time 12    Period Weeks    Status New    Target Date 01/29/21      PT LONG TERM GOAL #3   Title Patient will reduce falls risk as indicated by Activities Specific Balance Confidence Scale (ABC) >67%.    Baseline Scored 36.2% on 11/06/20;    Time 12    Period Weeks    Status New    Target Date 01/29/21      PT LONG TERM GOAL #4    Title Patient will demonstrate reduced falls risk as evidenced by Dynamic Gait Index (DGI) >19/24.    Baseline Scored 18/24 on 11/06/20;    Time 12    Period Weeks    Status New    Target Date 01/29/21      PT LONG TERM GOAL #5   Title Patient will report 50% or greater improvement in her symptoms of dizziness and imbalance with provoking motions or positions    Baseline Pt reports that she gets dizziness when  she rolls over, bends down and when at work looking at [employer]. Patient also reports unsteadiness on her feet;    Time 12    Period Weeks    Status New    Target Date 01/29/21                 Plan - 11/13/20 1313    Clinical Impression Statement Patient motivated to session and reports compliance with home exercise program. Patient able to progress to VOR X 1 with conflicting background this date and added body wall rolls and diagonal head turns to home exercise program. Patient's balance challenged by activities on Airex pad and ambulation with head turning activities. Patient would benefit from continued PT services to further address goals and functional deficits.    Personal Factors and Comorbidities Comorbidity 3+    Comorbidities HTN, sleep apnea, chronic tension type headaches    Examination-Activity Limitations Bend;Stairs    Examination-Participation Restrictions Other   computer screens at work   Stability/Clinical Decision Making Evolving/Moderate complexity    Rehab Potential Good    PT Frequency 1x / week    PT Duration 12 weeks    PT Treatment/Interventions Canalith Repostioning;Patient/family education;Vestibular;Balance training;Neuromuscular re-education    PT Next Visit Plan Review HEP, consider progressing to conflicting background, Airex pad EO/EC with and without head turns, body wall rolls, ambulation with head turns forward and retro ambulation    PT Home Exercise Plan VOR X 1 in standing with plain background, feet together and  semi-tandem progressions on pillow with head turns vertical and horizontal and body turns.    Consulted and Agree with Plan of Care Patient           Patient will benefit from skilled therapeutic intervention in order to improve the following deficits and impairments:  Dizziness,Decreased balance  Visit Diagnosis: Dizziness and giddiness  Unsteadiness on feet     Problem List Patient Active Problem List   Diagnosis Date Noted  . OSA (obstructive sleep apnea) 11/17/2017  . Hypersomnia with sleep apnea 11/17/2017  . Chronic tension-type headache, not intractable 05/20/2017  . Essential hypertension 05/20/2017  . Intermittent low back pain 04/21/2017  . GERD (gastroesophageal reflux disease) 03/12/2016  . Goiter 03/12/2016  . MVP (mitral valve prolapse) 02/15/2015  . History of iron deficiency anemia 02/15/2015  . Vitamin D deficiency 01/11/2015  . Vitamin B12 deficiency 01/11/2015  . Heart murmur 12/29/2011  . Fibroid uterus 12/29/2011  . Family history of breast cancer 12/29/2011   Lady Deutscher PT, DPT [phone] Lady Deutscher 11/13/2020, 1:18 PM  [place] MAIN [facility] SERVICES [address] [place], [place], [address] Phone: [phone]   Fax:  [phone]  Name: Aigner Horseman MRN: [id number] Date of Birth: April 15, 1976

## 2020-11-20 ENCOUNTER — Encounter: Payer: Self-pay | Admitting: Physical Therapy

## 2020-11-20 ENCOUNTER — Other Ambulatory Visit: Payer: Self-pay

## 2020-11-20 ENCOUNTER — Ambulatory Visit: Payer: Commercial Managed Care - PPO | Admitting: Physical Therapy

## 2020-11-20 DIAGNOSIS — R42 Dizziness and giddiness: Secondary | ICD-10-CM

## 2020-11-20 DIAGNOSIS — R2681 Unsteadiness on feet: Secondary | ICD-10-CM

## 2020-11-20 NOTE — Therapy (Signed)
[place] MAIN [facility] SERVICES [address] [place], [place], [address] Phone: [phone]   Fax:  [phone]  Physical Therapy Treatment  Patient Details  Name: [name] MRN: [id number] Date of Birth: [birth year] Referring Provider (PT): Dr. Gemma Payor   Encounter Date: 11/20/2020   PT End of Session - 11/20/20 0936    Visit Number 3    Number of Visits 13    Date for PT Re-Evaluation 01/29/21    Authorization Type Eval date: 11/06/20    PT Start Time 0935    PT Stop Time 1013    PT Time Calculation (min) 38 min    Equipment Utilized During Treatment Gait belt    Activity Tolerance Patient tolerated treatment well    Behavior During Therapy [facility] for tasks assessed/performed           Past Medical History:  Diagnosis Date  . Abnormal Pap smear   . Anemia    during pregnancy  . GERD (gastroesophageal reflux disease)   . Heart murmur   . Hypertension    boderline  . MVP (mitral valve prolapse)   . Obesity   . Uterine fibroid     Past Surgical History:  Procedure Laterality Date  . BREAST BIOPSY Left 11/17/2019   Affirm bx-"X" clip-path pending  . LEEP    . WISDOM TOOTH EXTRACTION     x4    There were no vitals filed for this visit.   Subjective Assessment - 11/20/20 0937    Subjective Patient reports no change in her symptoms.    Pertinent History History obtained from patient report and medical chart review.   Patient reports the dizziness started about 2-3 months ago. Pt reports it was a sudden onset and has been happening everyday since. Patient describes her dizziness as unsteadiness/off-balance,  lightheadedness, aural fullness and vertigo. Patient reports that she is not getting vertigo often, but occurs sometimes when she is rolling over in bed. Patient states that she has a history of vertigo that began about 4 years ago and patient reports "it comes and goes". Pt reports nothing brings on her dizziness and  states nothing makes it better. However, patient reports bending over at times brings on the dizziness. Patient reports she does not use Meclizine. Patient had Dix-Hallpike testing at Dr. Ileene Hutchinson office which was negative per medical record and a VNG was ordered. Patient reports that she has had the VNG test and that it showed that her right ear was functioning at 68% per patient report. Per medical record, patient had Covid in October 2021 and has had some imbalance since that time.    Patient Stated Goals Pt would like to have decreased unsteadiness and to be able to work without dizziness as patient reports working on multiple computer screens at work can bring on her dizziness symptoms.           Neuromuscular Re-education:  VOR X 1 exercise:  Patient performed VOR X 1 horizontal in standing with conflicting background 4 reps of 1 minute each with good technique.  Patient reports mild dizziness and unsteadiness with this activity.   Airex balance beam: On Airex balance beam, performed sideways stance static holds with horizontal, diagonal and vertical head turns with eyes open and with eyes closed trails with CGA.  On Airex balance beam, performed sideways stepping with horizontal head turns 5' times multiple reps. On Airex balance beam, performed sideways stepping with vertical head turns 5' times  multiple reps. On Airex balance beam, performed tandem static stance with head turns and walking 5' times multiple reps with CGA. Patient's balance challenged by above activities and has increased difficulty with vertical head turns compared to horizontal and vertical head turns.  Diona Foley toss over shoulder: Patient performed multiple 37' trials of forward and retro ambulation while tossing ball over one shoulder with return catch over opposite shoulder with CGA.  Patient performed multiple 59' trials of forward and retro ambulation while tossing ball over one shoulder with return catch over  opposite shoulder varying the ball position to head, shoulder and waist level to promote head turning and tilting. Patient able to perform above activities without imbalance or dizziness.   Ambulation with head turns:  Patient performed 175' forwards and retro ambulation while scanning for targets in hallway with CGA. Patient denies dizziness and reports mild imbalance.     Added walking VOR conflicting background progression to HEP. Patient can also practice tandem walking with head turns at kitchen counter for HEP.        PT Education - 11/20/20 1713    Education Details Added walking VOR with conflicting background progression to HEP    Person(s) Educated Patient    Methods Explanation;Demonstration    Comprehension Verbalized understanding;Returned demonstration            PT Short Term Goals - 11/06/20 0903      PT SHORT TERM GOAL #1   Title Pt will be independent with HEP in order to improve balance and decrease dizziness symptoms in order to decrease fall risk and improve function at home and work.    Time 4    Period Weeks    Status New    Target Date 12/04/20             PT Long Term Goals - 11/06/20 0903      PT LONG TERM GOAL #1   Title Patient will have improved FOTO score of 6 points or greater in order to demonstrate improvements in patient's ADLs and functional performance.    Baseline Scored 50/100 on 11/06/20;    Time 12    Period Weeks    Status New    Target Date 01/29/21      PT LONG TERM GOAL #2   Title Patient will reduce perceived disability to low levels as indicated by <40 on Dizziness Handicap Inventory    Baseline Scored 42/100 on 11/06/20    Time 12    Period Weeks    Status New    Target Date 01/29/21      PT LONG TERM GOAL #3   Title Patient will reduce falls risk as indicated by Activities Specific Balance Confidence Scale (ABC) >67%.    Baseline Scored 36.2% on 11/06/20;    Time 12    Period Weeks    Status New    Target Date  01/29/21      PT LONG TERM GOAL #4   Title Patient will demonstrate reduced falls risk as evidenced by Dynamic Gait Index (DGI) >19/24.    Baseline Scored 18/24 on 11/06/20;    Time 12    Period Weeks    Status New    Target Date 01/29/21      PT LONG TERM GOAL #5   Title Patient will report 50% or greater improvement in her symptoms of dizziness and imbalance with provoking motions or positions    Baseline Pt reports that she gets dizziness when she rolls  over, bends down and when at work looking at multiple computer screens. Patient also reports unsteadiness on her feet;    Time 12    Period Weeks    Status New    Target Date 01/29/21                 Plan - 11/20/20 1719    Clinical Impression Statement Patient progressed to activities on Airex balance beam this date. Patient most challenged by the Airex balance beam exercises especially vertical head turns and eyes closed trials and would benefit from continued practice with compliant surfaces. Patient did well with ambulation with ball toss over shoulder activity and patient denied diziness and imbalance symptons with these activities. Patient reported mild imbalance with walking VOR X1 with conflicting background and added this progression to home exercise program. Next session, plan on tyring half foam roll and rockerboard activities and working on Airex balance beam progressions.    Personal Factors and Comorbidities Comorbidity 3+    Comorbidities HTN, sleep apnea, chronic tension type headaches    Examination-Activity Limitations Bend;Stairs    Examination-Participation Restrictions Other   computer screens at work   Stability/Clinical Decision Making Evolving/Moderate complexity    Rehab Potential Good    PT Frequency 1x / week    PT Duration 12 weeks    PT Treatment/Interventions Canalith Repostioning;Patient/family education;Vestibular;Balance training;Neuromuscular re-education    PT Next Visit Plan Review HEP,  consider progressing to conflicting background, Airex pad EO/EC with and without head turns, body wall rolls, ambulation with head turns forward and retro ambulation    PT Home Exercise Plan VOR X 1 in standing with plain background, feet together and semi-tandem progressions on pillow with head turns vertical and horizontal and body turns.    Consulted and Agree with Plan of Care Patient           Patient will benefit from skilled therapeutic intervention in order to improve the following deficits and impairments:  Dizziness,Decreased balance  Visit Diagnosis: Dizziness and giddiness  Unsteadiness on feet     Problem List Patient Active Problem List   Diagnosis Date Noted  . OSA (obstructive sleep apnea) 11/17/2017  . Hypersomnia with sleep apnea 11/17/2017  . Chronic tension-type headache, not intractable 05/20/2017  . Essential hypertension 05/20/2017  . Intermittent low back pain 04/21/2017  . GERD (gastroesophageal reflux disease) 03/12/2016  . Goiter 03/12/2016  . MVP (mitral valve prolapse) 02/15/2015  . History of iron deficiency anemia 02/15/2015  . Vitamin D deficiency 01/11/2015  . Vitamin B12 deficiency 01/11/2015  . Heart murmur 12/29/2011  . Fibroid uterus 12/29/2011  . Family history of breast cancer 12/29/2011   Lady Deutscher PT, DPT [phone] Lady Deutscher 11/20/2020, 5:22 PM  [place] MAIN [facility] [address] [place], [place], [address] Phone: [phone]   Fax:  [phone]  Name: [name] MRN: [id number] Date of Birth: 1975/12/21

## 2020-11-27 ENCOUNTER — Ambulatory Visit: Payer: Commercial Managed Care - PPO | Admitting: Physical Therapy

## 2020-11-27 ENCOUNTER — Encounter: Payer: Self-pay | Admitting: Physical Therapy

## 2020-11-27 ENCOUNTER — Other Ambulatory Visit: Payer: Self-pay

## 2020-11-27 DIAGNOSIS — R2681 Unsteadiness on feet: Secondary | ICD-10-CM

## 2020-11-27 DIAGNOSIS — R42 Dizziness and giddiness: Secondary | ICD-10-CM

## 2020-11-30 NOTE — Therapy (Addendum)
[place] MAIN [facility] SERVICES [address] [place], [place], [address] Phone: [phone]   Fax:  [phone]  Physical Therapy Treatment  Patient Details  Name: [name] MRN: [id number] Date of Birth: 1976-04-07 Referring Provider (PT): Dr. Gemma Payor   Encounter Date: 11/27/2020   PT End of Session - 11/30/20 0833    Visit Number 4    Number of Visits 13    Date for PT Re-Evaluation 01/29/21    Authorization Type Eval date: 11/06/20    PT Start Time 1111    PT Stop Time 1156    PT Time Calculation (min) 45 min    Equipment Utilized During Treatment Gait belt    Activity Tolerance Patient tolerated treatment well    Behavior During Therapy [facility] for tasks assessed/performed           Past Medical History:  Diagnosis Date  . Abnormal Pap smear   . Anemia    during pregnancy  . GERD (gastroesophageal reflux disease)   . Heart murmur   . Hypertension    boderline  . MVP (mitral valve prolapse)   . Obesity   . Uterine fibroid     Past Surgical History:  Procedure Laterality Date  . BREAST BIOPSY Left 11/17/2019   Affirm bx-"X" clip-path pending  . LEEP    . WISDOM TOOTH EXTRACTION     x4    There were no vitals filed for this visit.   Subjective Assessment - 11/30/20 0835    Subjective Patient reports she did her exercises a little bit and states she did not do the VOR X 1 while walking exercise.    Pertinent History History obtained from patient report and medical chart review.   Patient reports the dizziness started about 2-3 months ago. Pt reports it was a sudden onset and has been happening everyday since. Patient describes her dizziness as unsteadiness/off-balance,  lightheadedness, aural fullness and vertigo. Patient reports that she is not getting vertigo often, but occurs sometimes when she is rolling over in bed. Patient states that she has a history of vertigo that began about 4 years ago and patient reports  "it comes and goes". Pt reports nothing brings on her dizziness and states nothing makes it better. However, patient reports bending over at times brings on the dizziness. Patient reports she does not use Meclizine. Patient had Dix-Hallpike testing at Dr. Ileene Hutchinson office which was negative per medical record and a VNG was ordered. Patient reports that she has had the VNG test and that it showed that her right ear was functioning at 68% per patient report. Per medical record, patient had Covid in October 2021 and has had some imbalance since that time.    Patient Stated Goals Pt would like to have decreased unsteadiness and to be able to work without dizziness as patient reports working on multiple computer screens at work can bring on her dizziness symptoms.           Neuromuscular Re-education:  VOR X 1 exercise:  Patient performed walking VOR X 1 horizontal with conflicting background 4 reps of about 21' each.  Patient reports mild unsteadiness with this activity.   Body Wall Rolls:  Patient performed 6 reps of unsupported, body wall rolls with eyes closed, but patient did reach to touch wall a few times with CGA.  Patient rates 2/10 symptoms.  Quick Turns:  Patient performed 10 reps of walking 8' with alternating quick turns left  and right with Supervision.  Patient did well with this activity and denies symptoms and no unsteadiness noted.    1/2 Foam Roll:  Worked on 1/2 foam roll flat side down static holds with and without horizontal and vertical head turns with CGA.  On 1/2 foam roll with round side down worked on static holds with and without horizontal and vertical head turns with CGA. Patient able to utilize ankle and hip strategies to help correct losses of balance.  Rockerboard: On medium wooden rocker board, worked on side to side and anterior/posterior sways with and without horizontal and vertical head turns with CGA at single wall bar.  Diona Foley toss over shoulder: Patient  performed multiple 60' trials of forward and retro ambulation while tossing ball over one shoulder with return catch over opposite shoulder varying the ball position to head, shoulder and waist level to promote head turning and tilting with CGA. Patient did well this date with no evidence of imbalance and patient denied symptoms.   Patient reports that she gets symptoms when bending forward to wash her hair in the sink or in the garden tub.  Therefore retested BPPV canal tests and all were negative and attempted nose to knee habituation exercise which reproduced mild symptoms that patient rates as 2-3/10.   BPPV TESTS:  Symptoms Duration Intensity Nystagmus  Left Dix-Hallpike None N/A N/A None observed  Right Dix-Hallpike None N/A N/A None observed  Left Head Roll None N/A N/A None observed  Right Head Roll None N/A N/A None observed   Habituation Exercise: Performed 10 reps left and then 10 reps right nose towards knee habituation exercise in sitting.  Patient reports 3/10 symptoms with right knee and 2/10 symptoms with left knee.   Added this exercise for HEP via [place].    Access Code: [email or web address]  URL: [email or web address]  Date: 11/27/2020  Prepared by: Lady Deutscher    Exercises  Seated Nose to Right Knee Vestibular Habituation - 1 x daily - 7 x weekly - 2 sets - 10 reps  Semi-tandem stance with horizontal head turns - 1 x daily - 7 x weekly - 2 sets - 5 reps - 1 minute hold  Feet together with horizontal head turns - 1 x daily - 7 x weekly - 2 sets - 5 reps - 1 minute hold  Feet together with vertical head turns - 1 x daily - 7 x weekly - 2 sets - 5 reps - 1 minute hold  Standing Gaze Stabilization with Head Rotation - 1 x daily - 7 x weekly - 3 sets - 3 reps - 1 minute hold      PT Education - 11/30/20 0830    Education Details reviewed HEP; exercise/balance technique; added nose to knee habituation exercise issued via [place]    Person(s) Educated Patient    Methods  Explanation;Verbal cues    Comprehension Verbalized understanding            PT Short Term Goals - 11/06/20 0903      PT SHORT TERM GOAL #1   Title Pt will be independent with HEP in order to improve balance and decrease dizziness symptoms in order to decrease fall risk and improve function at home and work.    Time 4    Period Weeks    Status New    Target Date 12/04/20             PT Long Term Goals - 11/06/20 [phone]  PT LONG TERM GOAL #1   Title Patient will have improved FOTO score of 6 points or greater in order to demonstrate improvements in patient's ADLs and functional performance.    Baseline Scored 50/100 on 11/06/20;    Time 12    Period Weeks    Status New    Target Date 01/29/21      PT LONG TERM GOAL #2   Title Patient will reduce perceived disability to low levels as indicated by <40 on Dizziness Handicap Inventory    Baseline Scored 42/100 on 11/06/20    Time 12    Period Weeks    Status New    Target Date 01/29/21      PT LONG TERM GOAL #3   Title Patient will reduce falls risk as indicated by Activities Specific Balance Confidence Scale (ABC) >67%.    Baseline Scored 36.2% on 11/06/20;    Time 12    Period Weeks    Status New    Target Date 01/29/21      PT LONG TERM GOAL #4   Title Patient will demonstrate reduced falls risk as evidenced by Dynamic Gait Index (DGI) >19/24.    Baseline Scored 18/24 on 11/06/20;    Time 12    Period Weeks    Status New    Target Date 01/29/21      PT LONG TERM GOAL #5   Title Patient will report 50% or greater improvement in her symptoms of dizziness and imbalance with provoking motions or positions    Baseline Pt reports that she gets dizziness when she rolls over, bends down and when at work looking at multiple computer screens. Patient also reports unsteadiness on her feet;    Time 12    Period Weeks    Status New    Target Date 01/29/21              Plan - 11/30/20 0908    Clinical Impression  Statement Patient reports that she gets symptoms when bending forward to wash her hair in the sink or in the garden tub.  Therefore retested BPPV canal tests and all were negative and attempted nose to knee habituation exercise which reproduced mild symptoms that patient rates as 2-3/10. Added nose to knee habituation exercise to home exericse program and issued via [place]. Patient did well with alternating quick turns and ball toss over shoulder exercise this date. Patient reported mild symptoms with body wall rolls, waking VOR with conflicting background and habituation exercises. Patient would benefit from continued PT services to further address patient's subjective symptoms of unsteadiness and goals as set on plan of care. Next sesison plan on trying ball sorting while standing on red floor mat activity and reviewing habituation exercise.    Personal Factors and Comorbidities Comorbidity 3+    Comorbidities HTN, sleep apnea, chronic tension type headaches    Examination-Activity Limitations Bend;Stairs    Examination-Participation Restrictions Other   computer screens at work   Stability/Clinical Decision Making Evolving/Moderate complexity    Rehab Potential Good    PT Frequency 1x / week    PT Duration 12 weeks    PT Treatment/Interventions Canalith Repostioning;Patient/family education;Vestibular;Balance training;Neuromuscular re-education    PT Next Visit Plan Review HEP, consider progressing to conflicting background, Airex pad EO/EC with and without head turns, body wall rolls, ambulation with head turns forward and retro ambulation    PT Home Exercise Plan VOR X 1 in standing with plain background, feet together and semi-tandem  progressions on pillow with head turns vertical and horizontal and body turns.;  Access Code: [email or web address]  URL: [email or web address]  Date: 11/27/2020  Prepared by: Lady Deutscher    Exercises  Seated Nose to Right Knee Vestibular Habituation - 1 x daily  - 7 x weekly - 2 sets - 10 reps  Semi-tandem stance with horizontal head turns - 1 x daily - 7 x weekly - 2 sets - 5 reps - 1 minute hold  Feet together with horizontal head turns - 1 x daily - 7 x weekly - 2 sets - 5 reps - 1 minute hold  Feet together with vertical head turns - 1 x daily - 7 x weekly - 2 sets - 5 reps - 1 minute hold  Standing Gaze Stabilization with Head Rotation - 1 x daily - 7 x weekly - 3 sets - 3 reps - 1 minute hold    Consulted and Agree with Plan of Care Patient                 Patient will benefit from skilled therapeutic intervention in order to improve the following deficits and impairments:  Dizziness,Decreased balance  Visit Diagnosis: Dizziness and giddiness  Unsteadiness on feet     Problem List Patient Active Problem List   Diagnosis Date Noted  . OSA (obstructive sleep apnea) 11/17/2017  . Hypersomnia with sleep apnea 11/17/2017  . Chronic tension-type headache, not intractable 05/20/2017  . Essential hypertension 05/20/2017  . Intermittent low back pain 04/21/2017  . GERD (gastroesophageal reflux disease) 03/12/2016  . Goiter 03/12/2016  . MVP (mitral valve prolapse) 02/15/2015  . History of iron deficiency anemia 02/15/2015  . Vitamin D deficiency 01/11/2015  . Vitamin B12 deficiency 01/11/2015  . Heart murmur 12/29/2011  . Fibroid uterus 12/29/2011  . Family history of breast cancer 12/29/2011   Lady Deutscher PT, DPT [phone] Lady Deutscher 11/30/2020, 8:35 AM  [place] MAIN [facility] SERVICES [address] [place], [place], [address] Phone: [phone]   Fax:  [phone]  Name: Taheera Thomann MRN: [id number] Date of Birth: 08-Feb-1976

## 2020-12-04 ENCOUNTER — Ambulatory Visit: Payer: Commercial Managed Care - PPO | Admitting: Physical Therapy

## 2020-12-11 ENCOUNTER — Ambulatory Visit: Payer: Commercial Managed Care - PPO | Admitting: Physical Therapy

## 2020-12-17 ENCOUNTER — Ambulatory Visit: Payer: Commercial Managed Care - PPO | Attending: Unknown Physician Specialty | Admitting: Physical Therapy

## 2021-01-20 ENCOUNTER — Encounter: Payer: Self-pay | Admitting: Family Medicine

## 2021-01-31 ENCOUNTER — Other Ambulatory Visit: Payer: Self-pay

## 2021-01-31 ENCOUNTER — Ambulatory Visit (INDEPENDENT_AMBULATORY_CARE_PROVIDER_SITE_OTHER): Payer: Commercial Managed Care - PPO | Admitting: *Deleted

## 2021-01-31 ENCOUNTER — Telehealth (INDEPENDENT_AMBULATORY_CARE_PROVIDER_SITE_OTHER): Payer: Self-pay | Admitting: Gastroenterology

## 2021-01-31 VITALS — BP 137/84 | HR 76

## 2021-01-31 DIAGNOSIS — Z1211 Encounter for screening for malignant neoplasm of colon: Secondary | ICD-10-CM

## 2021-01-31 DIAGNOSIS — R319 Hematuria, unspecified: Secondary | ICD-10-CM

## 2021-01-31 LAB — POCT URINALYSIS DIPSTICK: Leukocytes, UA: NEGATIVE

## 2021-01-31 MED ORDER — NA SULFATE-K SULFATE-MG SULF 17.5-3.13-1.6 GM/177ML PO SOLN: 1.0000 | Freq: Once | ORAL | 0 refills | Status: AC

## 2021-01-31 NOTE — Progress Notes (Signed)
SUBJECTIVE: [name] is a 46 y.o. female who complains of  blood in her urine. Denies urinary frequency, urgency and dysuria . Denies flank pain, fever, chills, or abnormal vaginal discharge or bleeding.   OBJECTIVE: Appears well, in no apparent distress.  Vital signs are normal. Urine dipstick shows positive for RBC's.    ASSESSMENT: Hematuria  PLAN: Will send for culture, pt will call if she gets any other symptoms.

## 2021-01-31 NOTE — Progress Notes (Signed)
Gastroenterology Pre-Procedure Review  Request Date: 03/01/21 Requesting Physician: Dr. Bonna Gains  PATIENT REVIEW QUESTIONS: The patient responded to the following health history questions as indicated:    1. Are you having any GI issues?  Constipation sometimes. 2. Do you have a personal history of Polyps? no 3. Do you have a family history of Colon Cancer or Polyps? no 4. Diabetes Mellitus? no 5. Joint replacements in the past 12 months?no 6. Major health problems in the past 3 months?no 7. Any artificial heart valves, MVP, or defibrillator?no    MEDICATIONS & ALLERGIES:    Patient reports the following regarding taking any anticoagulation/antiplatelet therapy:   Plavix, Coumadin, Eliquis, Xarelto, Lovenox, Pradaxa, Brilinta, or Effient? no Aspirin? no  Patient confirms/reports the following medications:  Current Outpatient Medications  Medication Sig Dispense Refill   baclofen (LIORESAL) 10 MG tablet Take 1 tablet (10 mg total) by mouth 3 (three) times daily. (Patient not taking: Reported on 11/06/2020) 30 each 0   Cholecalciferol (VITAMIN D) 2000 units CAPS Take by mouth.     Cyanocobalamin (B-12 PO) Take by mouth.     fluticasone (FLONASE) 50 MCG/ACT nasal spray Place 1 spray into both nostrils as needed. 48 g 1   levothyroxine (SYNTHROID) 50 MCG tablet Take by mouth.     metoprolol succinate (TOPROL-XL) 25 MG 24 hr tablet Take 1 tablet (25 mg total) by mouth daily. 90 tablet 0   No current facility-administered medications for this visit.    Patient confirms/reports the following allergies:  No Known Allergies  No orders of the defined types were placed in this encounter.   AUTHORIZATION INFORMATION Primary Insurance: 1D#: Group #:  Secondary Insurance: 1D#: Group #:  SCHEDULE INFORMATION: Date: 03/01/21 Time: Location: [place]

## 2021-02-02 LAB — URINE CULTURE

## 2021-02-05 ENCOUNTER — Other Ambulatory Visit: Payer: Self-pay | Admitting: Family Medicine

## 2021-02-05 DIAGNOSIS — I10 Essential (primary) hypertension: Secondary | ICD-10-CM

## 2021-02-05 DIAGNOSIS — G44229 Chronic tension-type headache, not intractable: Secondary | ICD-10-CM

## 2021-02-06 ENCOUNTER — Other Ambulatory Visit: Payer: Self-pay

## 2021-02-06 ENCOUNTER — Encounter: Payer: Self-pay | Admitting: Family Medicine

## 2021-02-06 ENCOUNTER — Ambulatory Visit (INDEPENDENT_AMBULATORY_CARE_PROVIDER_SITE_OTHER): Payer: Commercial Managed Care - PPO | Admitting: Family Medicine

## 2021-02-06 VITALS — BP 156/83 | HR 91 | Ht 61.0 in | Wt 194.6 lb

## 2021-02-06 DIAGNOSIS — R102 Pelvic and perineal pain: Secondary | ICD-10-CM | POA: Diagnosis not present

## 2021-02-06 DIAGNOSIS — Z803 Family history of malignant neoplasm of breast: Secondary | ICD-10-CM

## 2021-02-06 DIAGNOSIS — Z01411 Encounter for gynecological examination (general) (routine) with abnormal findings: Secondary | ICD-10-CM | POA: Diagnosis not present

## 2021-02-06 NOTE — Assessment & Plan Note (Signed)
Check mammogram 

## 2021-02-06 NOTE — Progress Notes (Signed)
Subjective:     [name] is a 45 y.o. female and is here for a comprehensive physical exam. The patient reports problems - pelvic pain . Midline cramping, comes and goes. Is mid-cycle and during cycle. Feels the same. No intermenstrual bleeding. Cycle is now longer, spotting on the first day.   The following portions of the patient's history were reviewed and updated as appropriate: allergies, current medications, past family history, past medical history, past social history, past surgical history, and problem list.  Review of Systems Pertinent items noted in HPI and remainder of comprehensive ROS otherwise negative.   Objective:    BP (!) 156/83   Pulse 91   Ht '5\' 1"'$  (1.549 m)   Wt 194 lb 9.6 oz (88.3 kg)   BMI 36.77 kg/m  General appearance: alert, cooperative, and appears stated age Head: Normocephalic, without obvious abnormality, atraumatic Neck: no adenopathy, supple, symmetrical, trachea midline, and thyroid not enlarged, symmetric, no tenderness/mass/nodules Lungs: clear to auscultation bilaterally Breasts: normal appearance, no masses or tenderness Heart: regular rate and rhythm, S1, S2 normal, no murmur, click, rub or gallop Abdomen: soft, non-tender; bowel sounds normal; no masses,  no organomegaly Pelvic: cervix normal in appearance, external genitalia normal, no adnexal masses or tenderness, no cervical motion tenderness, uterus normal size, shape, and consistency, and vagina normal without discharge Extremities: extremities normal, atraumatic, no cyanosis or edema Pulses: 2+ and symmetric Skin: Skin color, texture, turgor normal. No rashes or lesions Lymph nodes: Cervical, supraclavicular, and axillary nodes normal. Neurologic: Grossly normal    Assessment:    Healthy female exam.      Plan:   Problem List Items Addressed This Visit       Unprioritized   Family history of breast cancer    Check mammogram       Pelvic pain    Will check pelvic  sono       Relevant Orders   US PELVIC COMPLETE WITH TRANSVAGINAL   Other Visit Diagnoses     Encounter for gynecological examination with abnormal finding    -  Primary   Relevant Orders   MM 3D SCREEN BREAST BILATERAL      Return in 1 year (on 02/06/2022).    See After Visit Summary for Counseling Recommendations

## 2021-02-06 NOTE — Assessment & Plan Note (Signed)
Will check pelvic sono

## 2021-02-14 ENCOUNTER — Other Ambulatory Visit: Payer: Self-pay

## 2021-02-14 ENCOUNTER — Ambulatory Visit
Admission: RE | Admit: 2021-02-14 | Discharge: 2021-02-14 | Disposition: A | Discharge status: Home / Self Care | Payer: Commercial Managed Care - PPO | Source: Ambulatory Visit | Attending: Family Medicine | Admitting: Family Medicine

## 2021-02-14 DIAGNOSIS — R102 Pelvic and perineal pain: Secondary | ICD-10-CM | POA: Diagnosis present

## 2021-02-18 ENCOUNTER — Ambulatory Visit: Payer: Commercial Managed Care - PPO

## 2021-02-18 ENCOUNTER — Ambulatory Visit: Payer: Commercial Managed Care - PPO | Admitting: Urology

## 2021-02-18 ENCOUNTER — Other Ambulatory Visit: Payer: Self-pay

## 2021-02-18 ENCOUNTER — Encounter: Payer: Self-pay | Admitting: Urology

## 2021-02-18 VITALS — BP 150/79 | HR 105 | Ht 61.0 in | Wt 194.0 lb

## 2021-02-18 DIAGNOSIS — R319 Hematuria, unspecified: Secondary | ICD-10-CM

## 2021-02-18 DIAGNOSIS — R109 Unspecified abdominal pain: Secondary | ICD-10-CM

## 2021-02-18 DIAGNOSIS — R1030 Lower abdominal pain, unspecified: Secondary | ICD-10-CM | POA: Diagnosis not present

## 2021-02-18 LAB — URINALYSIS, COMPLETE
Bilirubin, UA: NEGATIVE
Glucose, UA: NEGATIVE
Ketones, UA: NEGATIVE
Nitrite, UA: NEGATIVE
Protein,UA: NEGATIVE
Specific Gravity, UA: 1.015 (ref 1.005–1.030)
Urobilinogen, Ur: 0.2 mg/dL (ref 0.2–1.0)
pH, UA: 5 (ref 5.0–7.5)

## 2021-02-18 LAB — MICROSCOPIC EXAMINATION

## 2021-02-18 NOTE — Progress Notes (Signed)
02/18/2021 3:11 PM   [name] 1975-09-14 [id number]  Referring provider: Steele Sizer, MD [address] [place],  [place] [address]  Chief Complaint  Patient presents with   Flank Pain    HPI: [name] is a 45 y.o. female self-referred for evaluation of back and lower abdominal pain.  86-monthhistory of bilateral flank/low back and suprapubic pain Described as dull and achy with mild to moderate severity Last week had intermittent episodes of crampy stabbing pain which only lasted a few seconds No bothersome LUTS Has moderate stress urinary incontinence Denies gross hematuria Was seen here in 2016 for hematuria and had several dipstick positive urinalysis with no microscopy performed She had a urinalysis 01/31/2021 which showed large blood on dipstick but no microscopy was performed Urine culture grew mixed flora   PMH: Past Medical History:  Diagnosis Date   Abnormal Pap smear    Anemia    during pregnancy   GERD (gastroesophageal reflux disease)    Heart murmur    Hypertension    boderline   MVP (mitral valve prolapse)    Obesity    Uterine fibroid     Surgical History: Past Surgical History:  Procedure Laterality Date   BREAST BIOPSY Left 11/17/2019   Affirm bx-"X" clip-path pending   LEEP     WISDOM TOOTH EXTRACTION     x4    Home Medications:  Allergies as of 02/18/2021   No Known Allergies      Medication List        Accurate as of February 18, 2021  3:11 PM. If you have any questions, ask your nurse or doctor.          STOP taking these medications    baclofen 10 MG tablet Commonly known as: LIORESAL Stopped by: SAbbie Sons MD       TAKE these medications    B-12 PO Take by mouth.   fluticasone 50 MCG/ACT nasal spray Commonly known as: FLONASE Place 1 spray into both nostrils as needed.   levothyroxine 50 MCG tablet Commonly known as: SYNTHROID Take by mouth.   metoprolol  succinate 25 MG 24 hr tablet Commonly known as: TOPROL-XL TAKE 1 TABLET (25 MG TOTAL) BY MOUTH DAILY.   Vitamin D 50 MCG (2000 UT) Caps Take by mouth.        Allergies: No Known Allergies  Family History: Family History  Problem Relation Age of Onset   Cancer Mother 481      deceased at age 47(breast)   Diabetes Mother    Hypertension Mother    Breast cancer Mother 345  Cancer Maternal Aunt 332      breast   Breast cancer Maternal Aunt 31   Cancer Maternal Grandmother 70       pancreatic   Cancer Maternal Grandfather 67       throat   Hypertension Father    Cancer Father 668      prostate   Hypertension Brother    Heart disease Paternal Grandmother        heart attack   Heart disease Paternal Grandfather        heart attack.    Social History:  reports that she has never smoked. She has never used smokeless tobacco. She reports that she does not drink alcohol and does not use drugs.   Physical Exam: BP (!) 150/79 (BP Location: Left Arm, Patient Position: Sitting, Cuff Size:  Large)   Pulse (!) 105   Ht '5\' 1"'$  (1.549 m)   Wt 194 lb (88 kg)   LMP 01/22/2021   BMI 36.66 kg/m   Constitutional:  Alert and oriented, No acute distress. HEENT: [place] AT, moist mucus membranes.  Trachea midline, no masses. Cardiovascular: No clubbing, cyanosis, or edema. Respiratory: Normal respiratory effort, no increased work of breathing. GI: Abdomen is soft, nontender, nondistended, no abdominal masses GU: No CVA tenderness Neurologic: Grossly intact, no focal deficits, moving all 4 extremities. Psychiatric: Normal mood and affect.  Laboratory Data:  Urinalysis Dipstick trace blood/1+ leukocytes Microscopy negative   Assessment & Plan:   45 y.o. female with 66-monthhistory of bilateral flank and suprapubic pain Urine 2 weeks ago with large blood on dipstick but no microscopy performed Urinalysis today shows no pyuria or microhematuria Recommend noncontrast CT abdomen pelvis  for further evaluation Will call with results   SAbbie Sons [place]ark [place] [place] [phone]

## 2021-02-20 ENCOUNTER — Other Ambulatory Visit: Payer: Self-pay

## 2021-02-20 ENCOUNTER — Other Ambulatory Visit: Payer: Self-pay | Admitting: Family Medicine

## 2021-02-20 MED ORDER — PEG 3350-KCL-NA BICARB-NACL 420 G PO SOLR: 4000.0000 mL | Freq: Once | ORAL | 0 refills | Status: AC

## 2021-02-20 MED ORDER — FLUCONAZOLE 150 MG PO TABS: 150.0000 mg | ORAL_TABLET | Freq: Every day | ORAL | 2 refills | Status: DC

## 2021-02-20 NOTE — Progress Notes (Signed)
Patient requested a cheaper prep because insurance will not cover it. Prep sent to pharmacy.

## 2021-02-28 ENCOUNTER — Encounter: Payer: Self-pay | Admitting: Gastroenterology

## 2021-03-01 ENCOUNTER — Ambulatory Visit: Payer: Commercial Managed Care - PPO | Admitting: Registered Nurse

## 2021-03-01 ENCOUNTER — Other Ambulatory Visit: Payer: Self-pay

## 2021-03-01 ENCOUNTER — Encounter: Payer: Self-pay | Admitting: Gastroenterology

## 2021-03-01 ENCOUNTER — Encounter
Admission: RE | Disposition: A | Discharge status: Home / Self Care | Payer: Self-pay | Source: Home / Self Care | Attending: Gastroenterology

## 2021-03-01 ENCOUNTER — Ambulatory Visit
Admission: RE | Admit: 2021-03-01 | Discharge: 2021-03-01 | Disposition: A | Discharge status: Home / Self Care | Payer: Commercial Managed Care - PPO | Attending: Gastroenterology | Admitting: Gastroenterology

## 2021-03-01 DIAGNOSIS — Z1211 Encounter for screening for malignant neoplasm of colon: Secondary | ICD-10-CM | POA: Insufficient documentation

## 2021-03-01 DIAGNOSIS — K635 Polyp of colon: Secondary | ICD-10-CM | POA: Diagnosis not present

## 2021-03-01 DIAGNOSIS — Z79890 Hormone replacement therapy (postmenopausal): Secondary | ICD-10-CM | POA: Insufficient documentation

## 2021-03-01 DIAGNOSIS — D122 Benign neoplasm of ascending colon: Secondary | ICD-10-CM | POA: Insufficient documentation

## 2021-03-01 DIAGNOSIS — Z79899 Other long term (current) drug therapy: Secondary | ICD-10-CM | POA: Diagnosis not present

## 2021-03-01 HISTORY — PX: COLONOSCOPY WITH PROPOFOL: SHX5780

## 2021-03-01 LAB — POCT PREGNANCY, URINE: Preg Test, Ur: NEGATIVE

## 2021-03-01 SURGERY — COLONOSCOPY WITH PROPOFOL
Anesthesia: General

## 2021-03-01 MED ORDER — LIDOCAINE HCL (CARDIAC) PF 100 MG/5ML IV SOSY
PREFILLED_SYRINGE | INTRAVENOUS | Status: DC | PRN
  Administered 2021-03-01: 100 mg via INTRAVENOUS

## 2021-03-01 MED ORDER — DEXMEDETOMIDINE HCL IN NACL 200 MCG/50ML IV SOLN
INTRAVENOUS | Status: DC | PRN
  Administered 2021-03-01: 12 ug via INTRAVENOUS

## 2021-03-01 MED ORDER — PROPOFOL 500 MG/50ML IV EMUL
INTRAVENOUS | Status: DC | PRN
  Administered 2021-03-01: 150 ug/kg/min via INTRAVENOUS

## 2021-03-01 MED ORDER — PROPOFOL 10 MG/ML IV BOLUS
INTRAVENOUS | Status: DC | PRN
  Administered 2021-03-01: 90 mg via INTRAVENOUS

## 2021-03-01 MED ORDER — SODIUM CHLORIDE 0.9 % IV SOLN: INTRAVENOUS | Status: DC

## 2021-03-01 NOTE — Op Note (Signed)
[facility] Patient Name: [name] Procedure Date: 03/01/2021 8:48 AM MRN: [id number] Account #: [id number] Date of Birth: 1975-08-14 Admit Type: Outpatient Age: 45 Room: [facility] ENDO ROOM 2 Gender: Female Note Status: Finalized Procedure:             Colonoscopy Indications:           Screening for colorectal malignant neoplasm Providers:             Eldean Klatt B. Bonna Gains MD, MD Referring MD:          Bethena Roys. Sowles, MD (Referring MD) Medicines:             Monitored Anesthesia Care Complications:         No immediate complications. Procedure:             Pre-Anesthesia Assessment:                        - ASA Grade Assessment: II - A patient with mild                         systemic disease.                        - Prior to the procedure, a History and Physical was                         performed, and patient medications, allergies and                         sensitivities were reviewed. The patient's tolerance                         of previous anesthesia was reviewed.                        - The risks and benefits of the procedure and the                         sedation options and risks were discussed with the                         patient. All questions were answered and informed                         consent was obtained.                        - Patient identification and proposed procedure were                         verified prior to the procedure by the physician, the                         nurse, the anesthesiologist, the anesthetist and the                         technician. The procedure was verified in the                         procedure  room.                        After obtaining informed consent, the colonoscope was                         passed under direct vision. Throughout the procedure,                         the patient's blood pressure, pulse, and oxygen                         saturations were  monitored continuously. The                         Colonoscope was introduced through the anus and                         advanced to the the terminal ileum. The colonoscopy                         was performed with ease. The patient tolerated the                         procedure well. The quality of the bowel preparation                         was good. Findings:      The perianal and digital rectal examinations were normal.      A 3 mm polyp was found in the ascending colon. The polyp was sessile.       Imaging was performed using white light and narrow band imaging to       visualize the mucosa. The polyp was removed with a jumbo cold forceps.       Resection and retrieval were complete.      The exam was otherwise without abnormality.      The rectum, sigmoid colon, descending colon, transverse colon, ascending       colon, cecum and ileum appeared normal.      The retroflexed view of the distal rectum and anal verge was normal and       showed no anal or rectal abnormalities. Impression:            - One 3 mm polyp in the ascending colon, removed with                         a jumbo cold forceps. Resected and retrieved.                        - The examination was otherwise normal.                        - The rectum, sigmoid colon, descending colon,                         transverse colon, ascending colon, cecum and terminal                         ileum are normal.                        -  The distal rectum and anal verge are normal on                         retroflexion view. Recommendation:        - Await pathology results.                        - Discharge patient to home (with escort).                        - Advance diet as tolerated.                        - Continue present medications.                        - Repeat colonoscopy date to be determined after                         pending pathology results are reviewed.                        - The findings and  recommendations were discussed with                         the patient.                        - The findings and recommendations were discussed with                         the patient's family.                        - Return to primary care physician as previously                         scheduled. Procedure Code(s):     --- Professional ---                        [phone], Colonoscopy, flexible; with biopsy, single or                         multiple Diagnosis Code(s):     --- Professional ---                        Z12.11, Encounter for screening for malignant neoplasm                         of colon                        K63.5, Polyp of colon CPT copyright 2019 American Medical Association. All rights reserved. The codes documented in this report are preliminary and upon coder review may  be revised to meet current compliance requirements.  Vonda Antigua, MD Margretta Sidle B. Bonna Gains MD, MD 03/01/2021 9:34:26 AM This report has been signed electronically. Number of Addenda: 0 Note Initiated On: 03/01/2021 8:48 AM Scope Withdrawal Time: 0 hours 17 minutes 29 seconds  Total Procedure Duration: 0 hours 21 minutes 22 seconds  Estimated Blood  Loss:  Estimated blood loss: none.      [facility]

## 2021-03-01 NOTE — Transfer of Care (Signed)
Immediate Anesthesia Transfer of Care Note  Patient: [name]  Procedure(s) Performed: COLONOSCOPY WITH PROPOFOL  Patient Location: Endoscopy Unit  Anesthesia Type:General  Level of Consciousness: drowsy  Airway & Oxygen Therapy: Patient Spontanous Breathing  Post-op Assessment: Report given to RN and Post -op Vital signs reviewed and stable  Post vital signs: Reviewed and stable  Last Vitals:  Vitals Value Taken Time  BP 101/53 03/01/21 0928  Temp    Pulse 99 03/01/21 0928  Resp 15 03/01/21 0928  SpO2 97 % 03/01/21 0928  Vitals shown include unvalidated device data.  Last Pain:  Vitals:   03/01/21 0838  TempSrc: Temporal  PainSc: 0-No pain         Complications: No notable events documented.

## 2021-03-01 NOTE — H&P (Signed)
Vonda Antigua, MD [address] [place], [place], [place], [address] [address], [place], [place], [place], [address] Phone: [phone]  Fax: [phone]  Primary Care Physician:  Steele Sizer, MD   Pre-Procedure History & Physical: HPI:  [name] is a 45 y.o. female is here for a colonoscopy.   Past Medical History:  Diagnosis Date   Abnormal Pap smear    Anemia    during pregnancy   GERD (gastroesophageal reflux disease)    Heart murmur    Hypertension    boderline   MVP (mitral valve prolapse)    Obesity    Uterine fibroid     Past Surgical History:  Procedure Laterality Date   BREAST BIOPSY Left 11/17/2019   Affirm bx-"X" clip-path pending   LEEP     WISDOM TOOTH EXTRACTION     x4    Prior to Admission medications   Medication Sig Start Date End Date Taking? Authorizing Provider  Cholecalciferol (VITAMIN D) 2000 units CAPS Take by mouth.   Yes [provider]  fluticasone (FLONASE) 50 MCG/ACT nasal spray Place 1 spray into both nostrils as needed. 06/07/19  Yes Sowles, Drue Stager, MD  levothyroxine (SYNTHROID) 50 MCG tablet Take by mouth. 09/10/20 09/10/21 Yes [provider]  metoprolol succinate (TOPROL-XL) 25 MG 24 hr tablet TAKE 1 TABLET (25 MG TOTAL) BY MOUTH DAILY. 02/05/21  Yes Sowles, Drue Stager, MD  Cyanocobalamin (B-12 PO) Take by mouth.    [provider]  fluconazole (DIFLUCAN) 150 MG tablet Take 1 tablet (150 mg total) by mouth daily. Repeat in 24 hours if needed 02/20/21   Donnamae Jude, MD    Allergies as of 01/31/2021   (No Known Allergies)    Family History  Problem Relation Age of Onset   Cancer Mother 78       deceased at age 55 (breast)   Diabetes Mother    Hypertension Mother    Breast cancer Mother 14   Cancer Maternal Aunt 65       breast   Breast cancer Maternal Aunt 31   Cancer Maternal Grandmother 70       pancreatic   Cancer Maternal Grandfather 67       throat   Hypertension  Father    Cancer Father 84       prostate   Hypertension Brother    Heart disease Paternal Grandmother        heart attack   Heart disease Paternal Grandfather        heart attack.    Social History   Socioeconomic History   Marital status: Married    Spouse name: Erick   Number of children: 5   Years of education: Not on file   Highest education level: Not on file  Occupational History   Occupation: billing   Tobacco Use   Smoking status: Never   Smokeless tobacco: Never  Vaping Use   Vaping Use: Never used  Substance and Sexual Activity   Alcohol use: Yes    Comment: occasionally   Drug use: No   Sexual activity: Yes    Partners: Male    Birth control/protection: Surgical    Comment: vasectomy  Other Topics Concern   Not on file  Social History Narrative   Married, 5 children, one is grown and out of the house.    Youngest born 2008.   Works full time at [employer] to school for health care information - online,  graduates in 2019   Social Determinants of Health   Financial Resource Strain: Low Risk    Difficulty of Paying Living Expenses: Not hard at all  Food Insecurity: No Food Insecurity   Worried About Charity fundraiser in the Last Year: Never true   Arboriculturist in the Last Year: Never true  Transportation Needs: No Transportation Needs   Lack of Transportation (Medical): No   Lack of Transportation (Non-Medical): No  Physical Activity: Insufficiently Active   Days of Exercise per Week: 1 day   Minutes of Exercise per Session: 30 min  Stress: Stress Concern Present   Feeling of Stress : To some extent  Social Connections: Moderately Isolated   Frequency of Communication with Friends and Family: Once a week   Frequency of Social Gatherings with Friends and Family: Once a week   Attends Religious Services: More than 4 times per year   Active Member of [employer] or Organizations: No   Attends Music therapist: Never   Marital  Status: Married  Human resources officer Violence: Not At Risk   Fear of Current or Ex-Partner: No   Emotionally Abused: No   Physically Abused: No   Sexually Abused: No    Review of Systems: See HPI, otherwise negative ROS  Physical Exam: Constitutional: General:   Alert,  Well-developed, well-nourished, pleasant and cooperative in NAD BP (!) 141/87   Pulse (!) 103   Temp (!) 96.8 F (36 C) (Temporal)   Resp 16   Ht '5\' 1"'$  (1.549 m)   Wt 88 kg   SpO2 100%   BMI 36.66 kg/m   Head: Normocephalic, atraumatic.   Eyes:  Sclera clear, no icterus.   Conjunctiva pink.   Mouth:  No deformity or lesions, oropharynx pink & moist.  Neck:  Supple, trachea midline  Respiratory: Normal respiratory effort  Gastrointestinal:  Soft, non-tender and non-distended without masses, hepatosplenomegaly or hernias noted.  No guarding or rebound tenderness.     Cardiac: No clubbing or edema.  No cyanosis. Normal posterior tibial pedal pulses noted.  Lymphatic:  No significant cervical adenopathy.  Psych:  Alert and cooperative. Normal mood and affect.  Musculoskeletal:   Symmetrical without gross deformities. 5/5 Lower extremity strength bilaterally.  Skin: Warm. Intact without significant lesions or rashes. No jaundice.  Neurologic:  Face symmetrical, tongue midline, Normal sensation to touch;  grossly normal neurologically.  Psych:  Alert and oriented x3, Alert and cooperative. Normal mood and affect.  Impression/Plan: [name] is here for a colonoscopy to be performed for average risk screening.  Risks, benefits, limitations, and alternatives regarding  colonoscopy have been reviewed with the patient.  Questions have been answered.  All parties agreeable.   Virgel Manifold, MD  03/01/2021, 8:48 AM

## 2021-03-01 NOTE — Anesthesia Preprocedure Evaluation (Signed)
Anesthesia Evaluation  Patient identified by MRN, date of birth, ID band Patient awake    Reviewed: Allergy & Precautions, NPO status , Patient's Chart, lab work & pertinent test results, reviewed documented beta blocker date and time   Airway Mallampati: II  TM Distance: >3 FB Neck ROM: Full    Dental no notable dental hx.    Pulmonary sleep apnea ,    Pulmonary exam normal        Cardiovascular hypertension, Pt. on medications and Pt. on home beta blockers Normal cardiovascular exam+ Valvular Problems/Murmurs MVP      Neuro/Psych  Headaches, negative psych ROS   GI/Hepatic Neg liver ROS, Bowel prep,GERD  ,  Endo/Other  Hypothyroidism   Renal/GU negative Renal ROS  negative genitourinary   Musculoskeletal negative musculoskeletal ROS (+)   Abdominal   Peds negative pediatric ROS (+)  Hematology negative hematology ROS (+) anemia ,   Anesthesia Other Findings Abnormal Pap smear    Anemia  during pregnancy  GERD (gastroesophageal reflux disease)  Heart murmur    Hypertension  boderline  MVP (mitral valve prolapse)    Obesity    Uterine fibroid       Reproductive/Obstetrics negative OB ROS                             Anesthesia Physical Anesthesia Plan  ASA: 2  Anesthesia Plan: General   Post-op Pain Management:    Induction:   PONV Risk Score and Plan: 3 and Propofol infusion and TIVA  Airway Management Planned: Natural Airway and Nasal Cannula  Additional Equipment:   Intra-op Plan:   Post-operative Plan:   Informed Consent: I have reviewed the patients History and Physical, chart, labs and discussed the procedure including the risks, benefits and alternatives for the proposed anesthesia with the patient or authorized representative who has indicated his/her understanding and acceptance.       Plan Discussed with: CRNA, Anesthesiologist and Surgeon  Anesthesia  Plan Comments:         Anesthesia Quick Evaluation

## 2021-03-01 NOTE — Anesthesia Postprocedure Evaluation (Signed)
Anesthesia Post Note  Patient: [name]  Procedure(s) Performed: COLONOSCOPY WITH PROPOFOL  Patient location during evaluation: Phase II Anesthesia Type: General Level of consciousness: awake and alert, awake and oriented Pain management: pain level controlled Vital Signs Assessment: post-procedure vital signs reviewed and stable Respiratory status: spontaneous breathing, nonlabored ventilation and respiratory function stable Cardiovascular status: blood pressure returned to baseline and stable Postop Assessment: no apparent nausea or vomiting Anesthetic complications: no   No notable events documented.   Last Vitals:  Vitals:   03/01/21 0950 03/01/21 1000  BP: 134/78 127/78  Pulse: 90 76  Resp: 17 10  Temp:    SpO2: 100% 100%    Last Pain:  Vitals:   03/01/21 0920  TempSrc: Temporal  PainSc:                  Phill Mutter

## 2021-03-02 NOTE — Progress Notes (Signed)
Non-identified Voicemail.  No Message left. ?

## 2021-03-04 ENCOUNTER — Encounter: Payer: Self-pay | Admitting: Gastroenterology

## 2021-03-04 LAB — SURGICAL PATHOLOGY

## 2021-03-06 ENCOUNTER — Other Ambulatory Visit: Payer: Self-pay

## 2021-03-06 ENCOUNTER — Ambulatory Visit
Admission: RE | Admit: 2021-03-06 | Discharge: 2021-03-06 | Disposition: A | Discharge status: Home / Self Care | Payer: Commercial Managed Care - PPO | Source: Ambulatory Visit | Attending: Urology | Admitting: Urology

## 2021-03-06 DIAGNOSIS — R109 Unspecified abdominal pain: Secondary | ICD-10-CM | POA: Insufficient documentation

## 2021-03-06 DIAGNOSIS — R1030 Lower abdominal pain, unspecified: Secondary | ICD-10-CM | POA: Insufficient documentation

## 2021-03-06 DIAGNOSIS — R319 Hematuria, unspecified: Secondary | ICD-10-CM | POA: Insufficient documentation

## 2021-03-08 ENCOUNTER — Encounter: Payer: Self-pay | Admitting: *Deleted

## 2021-03-13 ENCOUNTER — Ambulatory Visit: Payer: Commercial Managed Care - PPO

## 2021-03-19 NOTE — Progress Notes (Deleted)
Name: [name]   MRN: [id number]    DOB: Mar 08, 1976   Date:03/19/2021       Progress Note  Subjective  Chief Complaint  Follow Up  HPI  Vertigo: symptoms in the past, seen by ENT and had Epley maneuver, she states symptoms more frequent lately with daily symptoms, she called ENT and has an appointment next week. She states tuning in bed triggers symptoms and head movements but lately has been oozy feeling all the time and noticing ear pressure  HTN: BP is at goal, she states did not take bp medication for the past two days, she forgot taking it. She has intermittent sharp chest pain that lasts a few seconds and resolves by itself, she states palpitation has improved  Headaches: usually nuchal area, feels tight, improves with heating pads a lot of times, we will try adding baclofen prn  Insomnia: she is stressed at work, 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 Goes to bed between 11 pm to 1 am. Discussed sleep hygiene , try melatonin.   Goiter: she was seen by Dr. Honor Junes, had repeat TSH that was trending up, she is taking levothyroxine daily, no change in bowel movement or palpitation   OSA: wearing it every day , she states daily fatigue not as intense   B12 deficiency: not taking supplements at this time, we will recheck level today   Vitamin D : she ran out of rx vitamin D and did not get otc supplementation   Patient Active Problem List   Diagnosis Date Noted   Colon cancer screening    Polyp of ascending colon    OSA (obstructive sleep apnea) 11/17/2017   Hypersomnia with sleep apnea 11/17/2017   Chronic tension-type headache, not intractable 05/20/2017   Essential hypertension 05/20/2017   Intermittent low back pain 04/21/2017   GERD (gastroesophageal reflux disease) 03/12/2016   Goiter 03/12/2016   MVP (mitral valve prolapse) 02/15/2015   History of iron deficiency anemia 02/15/2015   Vitamin D deficiency 01/11/2015   Vitamin B12  deficiency 01/11/2015   Pelvic pain 10/26/2012   Heart murmur 12/29/2011   Fibroid uterus 12/29/2011   Family history of breast cancer 12/29/2011    Past Surgical History:  Procedure Laterality Date   BREAST BIOPSY Left 11/17/2019   Affirm bx-"X" clip-path pending   COLONOSCOPY WITH PROPOFOL N/A 03/01/2021   Procedure: COLONOSCOPY WITH PROPOFOL;  Surgeon: Virgel Manifold, MD;  Location: [facility] END[facility]COPY;  Service: Endoscopy;  Laterality: N/A;   LEEP     WISDOM TOOTH EXTRACTION     x4    Family History  Problem Relation Age of Onset   Cancer Mother 9       deceased at age 67 (breast)   Diabetes Mother    Hypertension Mother    Breast cancer Mother 64   Cancer Maternal Aunt 36       breast   Breast cancer Maternal Aunt 31   Cancer Maternal Grandmother 70       pancreatic   Cancer Maternal Grandfather 67       throat   Hypertension Father    Cancer Father 87       prostate   Hypertension Brother    Heart disease Paternal Grandmother        heart attack   Heart disease Paternal Grandfather        heart attack.    Social History   Tobacco Use   Smoking status: Never  Smokeless tobacco: Never  Substance Use Topics   Alcohol use: Yes    Comment: occasionally     Current Outpatient Medications:    Cholecalciferol (VITAMIN D) 2000 units CAPS, Take by mouth., Disp: , Rfl:    Cyanocobalamin (B-12 PO), Take by mouth., Disp: , Rfl:    fluconazole (DIFLUCAN) 150 MG tablet, Take 1 tablet (150 mg total) by mouth daily. Repeat in 24 hours if needed, Disp: 2 tablet, Rfl: 2   fluticasone (FLONASE) 50 MCG/ACT nasal spray, Place 1 spray into both nostrils as needed., Disp: 48 g, Rfl: 1   levothyroxine (SYNTHROID) 50 MCG tablet, Take by mouth., Disp: , Rfl:    metoprolol succinate (TOPROL-XL) 25 MG 24 hr tablet, TAKE 1 TABLET (25 MG TOTAL) BY MOUTH DAILY., Disp: 90 tablet, Rfl: 0  No Known Allergies  I personally reviewed {Reviewed:[id number]} with the patient/caregiver  today.   ROS  ***  Objective  There were no vitals filed for this visit.  There is no height or weight on file to calculate BMI.  Physical Exam ***  Recent Results (from the past 2160 hour(s))  POCT Urinalysis Dipstick     Status: Abnormal   Collection Time: 01/31/21 11:13 AM  Result Value Ref Range   Color, UA     Clarity, UA     Glucose, UA     Bilirubin, UA     Ketones, UA     Spec Grav, UA     Blood, UA large    pH, UA     Protein, UA     Urobilinogen, UA     Nitrite, UA     Leukocytes, UA Negative Negative   Appearance     Odor    Urine Culture     Status: None   Collection Time: 01/31/21 12:56 PM   Specimen: Urine   UR  Result Value Ref Range   Urine Culture, Routine Final report    Organism ID, Bacteria Comment     Comment: Mixed urogenital flora Less than 10,000 colonies/mL   Urinalysis, Complete     Status: Abnormal   Collection Time: 02/18/21  1:47 PM  Result Value Ref Range   Specific Gravity, UA 1.015 1.005 - 1.030   pH, UA 5.0 5.0 - 7.5   Color, UA Yellow Yellow   Appearance Ur Hazy (A) Clear   Leukocytes,UA 1+ (A) Negative   Protein,UA Negative Negative/Trace   Glucose, UA Negative Negative   Ketones, UA Negative Negative   RBC, UA Trace (A) Negative   Bilirubin, UA Negative Negative   Urobilinogen, Ur 0.2 0.2 - 1.0 mg/dL   Nitrite, UA Negative Negative   Microscopic Examination See below:   Microscopic Examination     Status: Abnormal   Collection Time: 02/18/21  1:47 PM   Urine  Result Value Ref Range   WBC, UA 0-5 0 - 5 /hpf   RBC 0-2 0 - 2 /hpf   Epithelial Cells (non renal) 0-10 0 - 10 /hpf   Bacteria, UA Few None seen/Few   Yeast, UA Present (A) None seen  Pregnancy, urine POC     Status: None   Collection Time: 03/01/21  8:34 AM  Result Value Ref Range   Preg Test, Ur NEGATIVE NEGATIVE    Comment:        THE SENSITIVITY OF THIS METHODOLOGY IS >24 mIU/mL   Surgical pathology     Status: None   Collection Time: 03/01/21   9:13 AM  Result  Value Ref Range   SURGICAL PATHOLOGY      SURGICAL PATHOLOGY CASE: [id number] PATIENT: [name] Surgical Pathology Report     Specimen Submitted: A. Colon polyp, ascending; cbx  Clinical History: Colon cancer screening Z12.11.  Colon polyp      DIAGNOSIS: A. COLON POLYP, ASCENDING; COLD BIOPSY: - TUBULAR ADENOMA. - NEGATIVE FOR HIGH GRADE DYSPLASIA AND MALIGNANCY.   GROSS DESCRIPTION: A. Labeled: cbx polyp ascending colon Received: Formalin Collection time: 9:13 AM on 03/01/2021 Placed into formalin time: 9:13 AM on 03/01/2021 Tissue fragment(s): 1 Size: 0.4 x 0.4 x 0.1 cm Description: Tan soft tissue fragment Entirely submitted in 1 cassette.  RB 03/01/2021  Final Diagnosis performed by Betsy Pries, MD.   Electronically signed 03/04/2021 9:09:41AM The electronic signature indicates that the named Attending Pathologist has evaluated the specimen Technical component performed at [facility], [address] [place], [place] [address] Lab: [phone] Dir: Rush Farmer, MD, MMM  Professio nal component performed at [facility], [facility], [place], [place], [place] [address] Lab: [id number] Dir: Dellia Nims. Rubinas, MD     Diabetic Foot Exam: Diabetic Foot Exam - Simple   No data filed    ***  PHQ2/9: Depression screen [facility] 2/9 09/17/2020 08/22/2019 06/07/2019 04/09/2018 11/17/2017  Decreased Interest 0 0 0 0 0  Down, Depressed, Hopeless 0 0 1 0 0  PHQ - 2 Score 0 0 1 0 0  Altered sleeping - 0 3 0 0  Tired, decreased energy - 0 0 0 1  Change in appetite - 0 0 0 0  Feeling bad or failure about yourself  - 0 0 0 0  Trouble concentrating - 0 1 0 0  Moving slowly or fidgety/restless - 0 0 0 0  Suicidal thoughts - 0 0 0 0  PHQ-9 Score - 0 5 0 1  Difficult doing work/chores - Not difficult at all Not difficult at all Not difficult at all Somewhat difficult    phq 9 is {gen pos [id number] ***  Fall Risk: Fall Risk   09/17/2020 08/22/2019 06/07/2019 04/09/2018 11/17/2017  Falls in the past year? 0 0 0 No No  Number falls in past yr: 0 0 0 - -  Injury with Fall? 0 0 0 - -   ***   Functional Status Survey:   ***   Assessment & Plan  *** There are no diagnoses linked to this encounter.

## 2021-03-20 ENCOUNTER — Ambulatory Visit: Payer: Commercial Managed Care - PPO | Admitting: Family Medicine

## 2021-06-05 ENCOUNTER — Other Ambulatory Visit: Payer: Self-pay | Admitting: Family Medicine

## 2021-06-05 DIAGNOSIS — I10 Essential (primary) hypertension: Secondary | ICD-10-CM

## 2021-06-05 DIAGNOSIS — G44229 Chronic tension-type headache, not intractable: Secondary | ICD-10-CM

## 2021-07-10 ENCOUNTER — Ambulatory Visit: Payer: Commercial Managed Care - PPO

## 2021-07-23 ENCOUNTER — Other Ambulatory Visit: Payer: Self-pay | Admitting: Student

## 2021-07-23 ENCOUNTER — Other Ambulatory Visit (HOSPITAL_COMMUNITY): Payer: Self-pay | Admitting: Student

## 2021-07-23 DIAGNOSIS — G44209 Tension-type headache, unspecified, not intractable: Secondary | ICD-10-CM

## 2021-07-31 ENCOUNTER — Ambulatory Visit: Admission: RE | Admit: 2021-07-31 | Payer: Commercial Managed Care - PPO | Source: Ambulatory Visit

## 2021-08-09 ENCOUNTER — Ambulatory Visit
Admission: RE | Admit: 2021-08-09 | Discharge: 2021-08-09 | Disposition: A | Discharge status: Home / Self Care | Payer: Commercial Managed Care - PPO | Source: Ambulatory Visit | Attending: Family Medicine | Admitting: Family Medicine

## 2021-08-09 ENCOUNTER — Other Ambulatory Visit: Payer: Self-pay

## 2021-08-09 DIAGNOSIS — Z1231 Encounter for screening mammogram for malignant neoplasm of breast: Secondary | ICD-10-CM | POA: Insufficient documentation

## 2021-08-09 DIAGNOSIS — Z01411 Encounter for gynecological examination (general) (routine) with abnormal findings: Secondary | ICD-10-CM | POA: Insufficient documentation

## 2021-08-13 NOTE — Progress Notes (Signed)
Name: [name]   MRN: [id number]    DOB: [birth year]   Date:08/14/2021       Progress Note  Subjective  Chief Complaint  Follow Up  HPI  BPPV: seen by ENT and able to do home Epley prn when symptoms are present   HTN: she did not take metoprolol this morning, she still has intermittent chest pain described as soreness, had a negative myoview study in 2022 but has follow up with cardiologist in a couple of months   Headaches: usually nuchal area, feels tight, improves with heating pads a lot of times, she states baclofen helps and takes it prn   Insomnia: she is stressed at work, 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 She is taking melatonin but still cannot fall asleep before 1 am, so she only sleeps for about 5 hours and has to take a nap in the afternoons. Discussed trying medication. We will try Quviq Dyslipidemia:   The 10-year ASCVD risk score (Arnett DK, et al., 2019) is: 4.5%   Values used to calculate the score:     Age: 46 years     Sex: Female     Is Non-Hispanic African American: Yes     Diabetic: No     Tobacco smoker: No     Systolic Blood Pressure: 474 mmHg     Is BP treated: Yes     HDL Cholesterol: 45 mg/dL     Total Cholesterol: 206 mg/dL   Goiter: she was seen by Dr. Honor Junes, last TSH was normal  she is taking levothyroxine daily, no change in bowel movement or palpitation   OSA: wearing CPAP most days of the week  , she still has to nap in the pm, but not getting enough sleep. Sleep study was done 2019   B12 deficiency: not taking supplements at this time, we will recheck level today   Vitamin D : she is back on 2000 units daily   Patient Active Problem List   Diagnosis Date Noted   Colon cancer screening    Polyp of ascending colon    OSA (obstructive sleep apnea) 11/17/2017   Hypersomnia with sleep apnea 11/17/2017   Chronic tension-type headache, not intractable 05/20/2017   Essential hypertension 05/20/2017    Intermittent low back pain 04/21/2017   GERD (gastroesophageal reflux disease) 03/12/2016   Goiter 03/12/2016   MVP (mitral valve prolapse) 02/15/2015   History of iron deficiency anemia 02/15/2015   Vitamin D deficiency 01/11/2015   Vitamin B12 deficiency 01/11/2015   Pelvic pain 10/26/2012   Heart murmur 12/29/2011   Fibroid uterus 12/29/2011   Family history of breast cancer 12/29/2011    Past Surgical History:  Procedure Laterality Date   BREAST BIOPSY Left 11/17/2019   BENIGN BREAST TISSUE WITH PSEUDOANGIOMATOUS X Clip   COLONOSCOPY WITH PROPOFOL N/A 03/01/2021   Procedure: COLONOSCOPY WITH PROPOFOL;  Surgeon: Virgel Manifold, MD;  Location: [facility] END[facility]COPY;  Service: Endoscopy;  Laterality: N/A;   LEEP     WISDOM TOOTH EXTRACTION     x4    Family History  Problem Relation Age of Onset   Cancer Mother 63       deceased at age 39 (breast)   Diabetes Mother    Hypertension Mother    Breast cancer Mother 23   Cancer Maternal Aunt 105       breast   Breast cancer Maternal Aunt 59   Cancer Maternal Grandmother 59  pancreatic   Cancer Maternal Grandfather 67       throat   Hypertension Father    Cancer Father 66       prostate   Hypertension Brother    Heart disease Paternal Grandmother        heart attack   Heart disease Paternal Grandfather        heart attack.    Social History   Tobacco Use   Smoking status: Never   Smokeless tobacco: Never  Substance Use Topics   Alcohol use: Yes    Comment: occasionally     Current Outpatient Medications:    Cholecalciferol (VITAMIN D) 2000 units CAPS, Take by mouth., Disp: , Rfl:    fluconazole (DIFLUCAN) 150 MG tablet, Take 1 tablet (150 mg total) by mouth daily. Repeat in 24 hours if needed, Disp: 2 tablet, Rfl: 2   fluticasone (FLONASE) 50 MCG/ACT nasal spray, Place 1 spray into both nostrils as needed., Disp: 48 g, Rfl: 1   levothyroxine (SYNTHROID) 50 MCG tablet, Take by mouth., Disp: , Rfl:     metoprolol succinate (TOPROL-XL) 25 MG 24 hr tablet, TAKE 1 TABLET (25 MG TOTAL) BY MOUTH DAILY., Disp: 90 tablet, Rfl: 0   Cyanocobalamin (B-12 PO), Take by mouth. (Patient not taking: Reported on 08/14/2021), Disp: , Rfl:   No Known Allergies  I personally reviewed active problem list, medication list, allergies, family history, social history, health maintenance with the patient/caregiver today.   ROS  Constitutional: Negative for fever or weight change.  Respiratory: Negative for cough and shortness of breath.   Cardiovascular: Negative for chest pain or palpitations.  Gastrointestinal: Negative for abdominal pain, no bowel changes.  Musculoskeletal: Negative for gait problem or joint swelling.  Skin: Negative for rash.  Neurological: Negative for dizziness or headache.  No other specific complaints in a complete review of systems (except as listed in HPI above).   Objective  Vitals:   08/14/21 1114  BP: 132/80  Pulse: (!) 101  Resp: 16  SpO2: 98%  Weight: 198 lb (89.8 kg)  Height: 5\' 1"  (1.549 m)    Body mass index is 37.41 kg/m.  Physical Exam  Constitutional: Patient appears well-developed and well-nourished. Obese  No distress.  HEENT: head atraumatic, normocephalic, pupils equal and reactive to light,  neck supple Cardiovascular: Normal rate, regular rhythm and normal heart sounds.  No murmur heard. No BLE edema. Pulmonary/Chest: Effort normal and breath sounds normal. No respiratory distress. Abdominal: Soft.  There is no tenderness. Psychiatric: Patient has a normal mood and affect. behavior is normal. Judgment and thought content normal.    PHQ2/9: Depression screen [facility] 2/9 08/14/2021 09/17/2020 08/22/2019 06/07/2019 04/09/2018  Decreased Interest 0 0 0 0 0  Down, Depressed, Hopeless 0 0 0 1 0  PHQ - 2 Score 0 0 0 1 0  Altered sleeping 0 - 0 3 0  Tired, decreased energy 0 - 0 0 0  Change in appetite 0 - 0 0 0  Feeling bad or failure about yourself  0 - 0 0 0   Trouble concentrating 0 - 0 1 0  Moving slowly or fidgety/restless 0 - 0 0 0  Suicidal thoughts 0 - 0 0 0  PHQ-9 Score 0 - 0 5 0  Difficult doing work/chores - - Not difficult at all Not difficult at all Not difficult at all    phq 9 is negative   Fall Risk: Fall Risk  08/14/2021 09/17/2020 08/22/2019 06/07/2019 04/09/2018  Falls  in the past year? 0 0 0 0 No  Number falls in past yr: 0 0 0 0 -  Injury with Fall? 0 0 0 0 -  Risk for fall due to : No Fall Risks - - - -  Follow up Falls prevention discussed - - - -      Functional Status Survey: Is the patient deaf or have difficulty hearing?: No Does the patient have difficulty seeing, even when wearing glasses/contacts?: No Does the patient have difficulty concentrating, remembering, or making decisions?: No Does the patient have difficulty walking or climbing stairs?: No Does the patient have difficulty dressing or bathing?: No Does the patient have difficulty doing errands alone such as visiting a doctor's office or shopping?: No    Assessment & Plan  1. Essential hypertension  - metoprolol succinate (TOPROL-XL) 25 MG 24 hr tablet; Take 1 tablet (25 mg total) by mouth daily.  Dispense: 90 tablet; Refill: 1  2. Vitamin D deficiency   3. OSA (obstructive sleep apnea)   4. Chronic tension-type headache, not intractable  - metoprolol succinate (TOPROL-XL) 25 MG 24 hr tablet; Take 1 tablet (25 mg total) by mouth daily.  Dispense: 90 tablet; Refill: 1  5. Vitamin B12 deficiency   6. Dyslipidemia   7. Benign paroxysmal positional vertigo, unspecified laterality   8. MVP (mitral valve prolapse)   9. Hypersomnia with sleep apnea  - Daridorexant HCl (QUVIVIQ) 25 MG TABS; Take 25 mg by mouth at bedtime as needed.  Dispense: 30 tablet; Refill: 5  10. Needs flu shot  - Flu Vaccine QUAD 6+ mos PF IM (Fluarix Quad PF)  11. Seasonal allergic rhinitis, unspecified trigger  - fluticasone (FLONASE) 50 MCG/ACT nasal  spray; Place 1 spray into both nostrils as needed.  Dispense: 48 g; Refill: 1  12. Primary insomnia  - Daridorexant HCl (QUVIVIQ) 25 MG TABS; Take 25 mg by mouth at bedtime as needed.  Dispense: 30 tablet; Refill: 5

## 2021-08-14 ENCOUNTER — Encounter: Payer: Self-pay | Admitting: Family Medicine

## 2021-08-14 ENCOUNTER — Ambulatory Visit: Payer: Commercial Managed Care - PPO | Admitting: Family Medicine

## 2021-08-14 ENCOUNTER — Other Ambulatory Visit: Payer: Self-pay

## 2021-08-14 VITALS — BP 132/80 | HR 101 | Resp 16 | Ht 61.0 in | Wt 198.0 lb

## 2021-08-14 DIAGNOSIS — I341 Nonrheumatic mitral (valve) prolapse: Secondary | ICD-10-CM

## 2021-08-14 DIAGNOSIS — G4733 Obstructive sleep apnea (adult) (pediatric): Secondary | ICD-10-CM

## 2021-08-14 DIAGNOSIS — Z23 Encounter for immunization: Secondary | ICD-10-CM | POA: Diagnosis not present

## 2021-08-14 DIAGNOSIS — E538 Deficiency of other specified B group vitamins: Secondary | ICD-10-CM

## 2021-08-14 DIAGNOSIS — F5101 Primary insomnia: Secondary | ICD-10-CM

## 2021-08-14 DIAGNOSIS — E785 Hyperlipidemia, unspecified: Secondary | ICD-10-CM

## 2021-08-14 DIAGNOSIS — E559 Vitamin D deficiency, unspecified: Secondary | ICD-10-CM

## 2021-08-14 DIAGNOSIS — G4710 Hypersomnia, unspecified: Secondary | ICD-10-CM

## 2021-08-14 DIAGNOSIS — G44229 Chronic tension-type headache, not intractable: Secondary | ICD-10-CM | POA: Diagnosis not present

## 2021-08-14 DIAGNOSIS — G4730 Sleep apnea, unspecified: Secondary | ICD-10-CM

## 2021-08-14 DIAGNOSIS — I10 Essential (primary) hypertension: Secondary | ICD-10-CM

## 2021-08-14 DIAGNOSIS — H8110 Benign paroxysmal vertigo, unspecified ear: Secondary | ICD-10-CM

## 2021-08-14 DIAGNOSIS — J302 Other seasonal allergic rhinitis: Secondary | ICD-10-CM

## 2021-08-14 MED ORDER — METOPROLOL SUCCINATE ER 25 MG PO TB24: 25.0000 mg | ORAL_TABLET | Freq: Every day | ORAL | 1 refills | Status: DC

## 2021-08-14 MED ORDER — QUVIVIQ 25 MG PO TABS: 25.0000 mg | ORAL_TABLET | Freq: Every evening | ORAL | 5 refills | Status: DC | PRN

## 2021-08-14 MED ORDER — FLUTICASONE PROPIONATE 50 MCG/ACT NA SUSP: 1.0000 | NASAL | 1 refills | Status: DC | PRN

## 2021-08-14 NOTE — Patient Instructions (Signed)
Go to: www.quviviq.com to sign up for savings card program to help with cost of medication.

## 2021-08-23 ENCOUNTER — Ambulatory Visit: Payer: Commercial Managed Care - PPO

## 2021-10-30 ENCOUNTER — Ambulatory Visit: Payer: Commercial Managed Care - PPO | Admitting: Family Medicine

## 2021-10-30 ENCOUNTER — Encounter: Payer: Self-pay | Admitting: Family Medicine

## 2021-10-30 VITALS — BP 141/83 | HR 102 | Ht 61.0 in | Wt 200.0 lb

## 2021-10-30 DIAGNOSIS — R102 Pelvic and perineal pain unspecified side: Secondary | ICD-10-CM

## 2021-10-30 MED ORDER — DOXYCYCLINE HYCLATE 100 MG PO CAPS: 100.0000 mg | ORAL_CAPSULE | Freq: Two times a day (BID) | ORAL | 0 refills | Status: DC

## 2021-10-30 NOTE — Progress Notes (Signed)
Having pelvic pain more frequently now  ? ?Denies any abnormal bleeding or discharge ?

## 2021-10-30 NOTE — Assessment & Plan Note (Signed)
Suspect adenomyosis. Discussed etiology, reviewed images and likely course. Discussed treatment options to include COC's, POPs, IUD, endometrial ablation, IUD, hysterectomy. She was not wanting to try any of these. ?We will repeat the u/s and see if there is something else notable. ?Trial of doxycycline for possible endometritis given pain and bleeding-- ?

## 2021-10-30 NOTE — Progress Notes (Signed)
ic ? ?Subjective:  ? ? Patient ID: [name] is a 46 y.o. female presenting with No chief complaint on file. ? on 10/30/2021 ? ?HPI: ?Seen for her annual and noted some cyclical and mid-cycle pain. Had u/s showing probable adenomyosis. Has not been seen this u/s for discussion of treatment options. Has pain on a daily basis now. Low in the midline and into her back. Uterus is retroverted. Feels like cramping pain. Feels it is not-cyclical. Notes heavier cycles. Did have some hematuria and seen urology and had cysto, without clear etiology--neg abd. CT. ? ?Review of Systems  ?Constitutional:  Negative for chills and fever.  ?Respiratory:  Negative for shortness of breath.   ?Cardiovascular:  Negative for chest pain.  ?Gastrointestinal:  Negative for abdominal pain, constipation, diarrhea, nausea and vomiting.  ?Genitourinary:  Positive for pelvic pain and vaginal bleeding. Negative for dysuria.  ?Skin:  Negative for rash.  ?   ?Objective:  ?  ?BP (!) 141/83   Pulse (!) 102   Ht '5\' 1"'$  (1.549 m)   Wt 200 lb (90.7 kg)   LMP 10/07/2021 (Exact Date)   BMI 37.79 kg/m?  ?Physical Exam ?Exam conducted with a chaperone present.  ?Constitutional:   ?   General: She is not in acute distress. ?   Appearance: She is well-developed.  ?HENT:  ?   Head: Normocephalic and atraumatic.  ?Eyes:  ?   General: No scleral icterus. ?Cardiovascular:  ?   Rate and Rhythm: Normal rate.  ?Pulmonary:  ?   Effort: Pulmonary effort is normal.  ?Abdominal:  ?   Palpations: Abdomen is soft.  ?Musculoskeletal:  ?   Cervical back: Neck supple.  ?Skin: ?   General: Skin is warm and dry.  ?Neurological:  ?   Mental Status: She is alert and oriented to person, place, and time.  ? ?Pelvic U/S 8/11/222 ?Uterus ?Measurements: 8.0 x 5.9 x 7.2 cm = volume: 179 mL. Retroverted. ?Mildly inhomogeneous myometrium especially at fundus. Minimal ?shadowing. Cannot exclude adenomyosis with this appearance. No focal ?mass  lesion. ?Endometrium ?Thickness: 11 mm.  No endometrial fluid or focal abnormality ?Right ovary ?Measurements: 4.2 x 2.4 x 2.0 cm = volume: 10.5 mL. Normal ?morphology without mass ?Left ovary ?Measurements: 2.7 x 1.4 x 2.1 cm = volume: 4.1 mL. Normal morphology ?without mass ?   ?Assessment & Plan:  ? ?Problem List Items Addressed This Visit   ? ?  ? Unprioritized  ? Pelvic pain - Primary  ?  Suspect adenomyosis. Discussed etiology, reviewed images and likely course. Discussed treatment options to include COC's, POPs, IUD, endometrial ablation, IUD, hysterectomy. She was not wanting to try any of these. ?We will repeat the u/s and see if there is something else notable. ?Trial of doxycycline for possible endometritis given pain and bleeding-- ? ?  ?  ? Relevant Medications  ? doxycycline (VIBRAMYCIN) 100 MG capsule  ? Other Relevant Orders  ? US PELVIC COMPLETE WITH TRANSVAGINAL  ? ? ?Return in about 6 weeks (around 12/11/2021) for a follow-up. ? ?Donnamae Jude, MD ?10/30/2021 ?10:26 AM ? ? ?

## 2021-11-13 ENCOUNTER — Ambulatory Visit: Payer: Commercial Managed Care - PPO | Admitting: Family Medicine

## 2021-11-15 ENCOUNTER — Ambulatory Visit: Payer: Commercial Managed Care - PPO | Attending: Family Medicine

## 2021-12-11 ENCOUNTER — Encounter: Payer: Self-pay | Admitting: Family Medicine

## 2021-12-11 ENCOUNTER — Ambulatory Visit: Payer: Commercial Managed Care - PPO | Admitting: Family Medicine

## 2021-12-11 NOTE — Progress Notes (Signed)
Patient did not keep appointment today. She may call to reschedule.  

## 2022-02-08 ENCOUNTER — Other Ambulatory Visit: Payer: Self-pay | Admitting: Family Medicine

## 2022-02-08 DIAGNOSIS — G44229 Chronic tension-type headache, not intractable: Secondary | ICD-10-CM

## 2022-02-08 DIAGNOSIS — I10 Essential (primary) hypertension: Secondary | ICD-10-CM

## 2022-02-11 NOTE — Progress Notes (Unsigned)
Name: [name]   MRN: [id number]    DOB: July 27, 1975   Date:02/12/2022       Progress Note  Subjective  Chief Complaint  Follow Up  HPI  BPPV: seen by ENT and able to do home Epley prn when symptoms are present . She states last episode was a few days ago but resolved with sinus medication   HTN: taking metoprolol and bp is at goal today,  she was having intermittent chest pain described as soreness, had a negative myoview study in 2022   The 10-year ASCVD risk score (Arnett DK, et al., 2019) is: 5.1%   Values used to calculate the score:     Age: 13 years     Sex: Female     Is Non-Hispanic African American: Yes     Diabetic: No     Tobacco smoker: No     Systolic Blood Pressure: 371 mmHg     Is BP treated: Yes     HDL Cholesterol: 45 mg/dL     Total Cholesterol: 206 mg/dL   Headaches: usually nuchal area, feels tight, improves with heating pads a lot of times, she used to take baclofen in the past but ran out and is taking Tylenol prn   Insomnia: last visit she said she was very stressed at work, 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 She was  taking melatonin but still could not fall asleep before 1 am, so she was only sleeping  for about 5 hours and was taking naps in the afternoon, we tried [place] but she stopped due to side effects and is back on melatonin and sleeping about 6 hours and feeling better    Goiter: she was seen by Dr. Honor Junes, last TSH was normal  she is taking levothyroxine daily, no change in bowel movement, dry skin  or palpitation   OSA: wearing CPAP every night lately, she states she grinds her teeth at night and wakes up with a headache, she has not discussed in with her dentist   B12 deficiency: not taking supplements at this time, advised to resume takign it   Vitamin D : she is back on 2000 units but intermittently, last level was down   Patient Active Problem List   Diagnosis Date Noted   Polyp of ascending colon     OSA (obstructive sleep apnea) 11/17/2017   Hypersomnia with sleep apnea 11/17/2017   Chronic tension-type headache, not intractable 05/20/2017   Essential hypertension 05/20/2017   Intermittent low back pain 04/21/2017   GERD (gastroesophageal reflux disease) 03/12/2016   Goiter 03/12/2016   MVP (mitral valve prolapse) 02/15/2015   History of iron deficiency anemia 02/15/2015   Vitamin D deficiency 01/11/2015   Vitamin B12 deficiency 01/11/2015   Pelvic pain 10/26/2012   Heart murmur 12/29/2011   Fibroid uterus 12/29/2011   Family history of breast cancer 12/29/2011    Past Surgical History:  Procedure Laterality Date   BREAST BIOPSY Left 11/17/2019   BENIGN BREAST TISSUE WITH PSEUDOANGIOMATOUS X Clip   COLONOSCOPY WITH PROPOFOL N/A 03/01/2021   Procedure: COLONOSCOPY WITH PROPOFOL;  Surgeon: Virgel Manifold, MD;  Location: [facility] ENDOSCOPY;  Service: Endoscopy;  Laterality: N/A;   LEEP     WISDOM TOOTH EXTRACTION     x4    Family History  Problem Relation Age of Onset   Cancer Mother 110       deceased at age 55 (breast)   Diabetes Mother  Hypertension Mother    Breast cancer Mother 39   Cancer Maternal Aunt 32       breast   Breast cancer Maternal Aunt 31   Cancer Maternal Grandmother 70       pancreatic   Cancer Maternal Grandfather 67       throat   Hypertension Father    Cancer Father 7       prostate   Hypertension Brother    Heart disease Paternal Grandmother        heart attack   Heart disease Paternal Grandfather        heart attack.    Social History   Tobacco Use   Smoking status: Never   Smokeless tobacco: Never  Substance Use Topics   Alcohol use: Yes    Comment: occasionally     Current Outpatient Medications:    Cholecalciferol (VITAMIN D) 2000 units CAPS, Take by mouth., Disp: , Rfl:    fluticasone (FLONASE) 50 MCG/ACT nasal spray, Place 1 spray into both nostrils as needed., Disp: 48 g, Rfl: 1   metoprolol succinate  (TOPROL-XL) 25 MG 24 hr tablet, Take 1 tablet (25 mg total) by mouth daily., Disp: 90 tablet, Rfl: 1   Daridorexant HCl (QUVIVIQ) 25 MG TABS, Take 25 mg by mouth at bedtime as needed. (Patient not taking: Reported on 02/12/2022), Disp: 30 tablet, Rfl: 5   doxycycline (VIBRAMYCIN) 100 MG capsule, Take 1 capsule (100 mg total) by mouth 2 (two) times daily. (Patient not taking: Reported on 02/12/2022), Disp: 20 capsule, Rfl: 0   levothyroxine (SYNTHROID) 50 MCG tablet, Take 1 tablet by mouth daily at 12 noon., Disp: , Rfl:   No Known Allergies  I personally reviewed active problem list, medication list, allergies, family history, social history, health maintenance with the patient/caregiver today.   ROS  Constitutional: Negative for fever or weight change.  Respiratory: Negative for cough and shortness of breath.   Cardiovascular: Negative for chest pain or palpitations.  Gastrointestinal: Negative for abdominal pain, no bowel changes.  Musculoskeletal: Negative for gait problem or joint swelling.  Skin: Negative for rash.  Neurological: Negative for dizziness , positive for headache.  No other specific complaints in a complete review of systems (except as listed in HPI above).   Objective  Vitals:   02/12/22 1105  BP: 136/80  Pulse: 92  Resp: 16  SpO2: 99%  Weight: 202 lb (91.6 kg)  Height: '5\' 1"'$  (1.549 m)    Body mass index is 38.17 kg/m.  Physical Exam  Constitutional: Patient appears well-developed and well-nourished. Obese  No distress.  HEENT: head atraumatic, normocephalic, pupils equal and reactive to light, neck supple ,goiter  Cardiovascular: Normal rate, regular rhythm and normal heart sounds.  No murmur heard. No BLE edema. Pulmonary/Chest: Effort normal and breath sounds normal. No respiratory distress. Abdominal: Soft.  There is no tenderness. Neuro: normal  Psychiatric: Patient has a normal mood and affect. behavior is normal. Judgment and thought content normal.    PHQ2/9:    02/12/2022   11:04 AM 08/14/2021   11:14 AM 09/17/2020    2:07 PM 08/22/2019   12:00 PM 06/07/2019    1:18 PM  Depression screen PHQ 2/9  Decreased Interest 0 0 0 0 0  Down, Depressed, Hopeless 0 0 0 0 1  PHQ - 2 Score 0 0 0 0 1  Altered sleeping 0 0  0 3  Tired, decreased energy 0 0  0 0  Change in appetite 0  0  0 0  Feeling bad or failure about yourself  0 0  0 0  Trouble concentrating 0 0  0 1  Moving slowly or fidgety/restless 0 0  0 0  Suicidal thoughts 0 0  0 0  PHQ-9 Score 0 0  0 5  Difficult doing work/chores    Not difficult at all Not difficult at all    phq 9 is negative   Fall Risk:    02/12/2022   11:04 AM 08/14/2021   11:14 AM 09/17/2020    2:07 PM 08/22/2019   12:00 PM 06/07/2019    1:18 PM  [place] in the past year? 0 0 0 0 0  Number falls in past yr: 0 0 0 0 0  Injury with Fall? 0 0 0 0 0  Risk for fall due to : No Fall Risks No Fall Risks     Follow up Falls prevention discussed Falls prevention discussed         Functional Status Survey: Is the patient deaf or have difficulty hearing?: No Does the patient have difficulty seeing, even when wearing glasses/contacts?: No Does the patient have difficulty concentrating, remembering, or making decisions?: No Does the patient have difficulty walking or climbing stairs?: No Does the patient have difficulty dressing or bathing?: No Does the patient have difficulty doing errands alone such as visiting a doctor's office or shopping?: No    Assessment & Plan  1. Essential hypertension  - metoprolol succinate (TOPROL-XL) 25 MG 24 hr tablet; Take 1 tablet (25 mg total) by mouth daily.  Dispense: 90 tablet; Refill: 1 - CBC with Differential/Platelet - COMPLETE METABOLIC PANEL WITH GFR  2. Chronic tension-type headache, not intractable  - metoprolol succinate (TOPROL-XL) 25 MG 24 hr tablet; Take 1 tablet (25 mg total) by mouth daily.  Dispense: 90 tablet; Refill: 1  3. Vitamin D  deficiency  - VITAMIN D 25 Hydroxy (Vit-D Deficiency, Fractures)  4. Vitamin B12 deficiency  - CBC with Differential/Platelet - Vitamin B12  5. OSA (obstructive sleep apnea)   6. Dyslipidemia  - Lipid panel  7. Diabetes mellitus screening  - Hemoglobin A1c  8. Primary insomnia

## 2022-02-12 ENCOUNTER — Encounter: Payer: Self-pay | Admitting: Family Medicine

## 2022-02-12 ENCOUNTER — Ambulatory Visit: Payer: Commercial Managed Care - PPO | Admitting: Family Medicine

## 2022-02-12 VITALS — BP 136/80 | HR 92 | Resp 16 | Ht 61.0 in | Wt 202.0 lb

## 2022-02-12 DIAGNOSIS — E559 Vitamin D deficiency, unspecified: Secondary | ICD-10-CM

## 2022-02-12 DIAGNOSIS — I10 Essential (primary) hypertension: Secondary | ICD-10-CM

## 2022-02-12 DIAGNOSIS — G4733 Obstructive sleep apnea (adult) (pediatric): Secondary | ICD-10-CM

## 2022-02-12 DIAGNOSIS — G44229 Chronic tension-type headache, not intractable: Secondary | ICD-10-CM | POA: Diagnosis not present

## 2022-02-12 DIAGNOSIS — E538 Deficiency of other specified B group vitamins: Secondary | ICD-10-CM | POA: Diagnosis not present

## 2022-02-12 DIAGNOSIS — E785 Hyperlipidemia, unspecified: Secondary | ICD-10-CM

## 2022-02-12 DIAGNOSIS — Z131 Encounter for screening for diabetes mellitus: Secondary | ICD-10-CM

## 2022-02-12 DIAGNOSIS — F5101 Primary insomnia: Secondary | ICD-10-CM

## 2022-02-12 MED ORDER — METOPROLOL SUCCINATE ER 25 MG PO TB24: 25.0000 mg | ORAL_TABLET | Freq: Every day | ORAL | 1 refills | Status: DC

## 2022-02-12 NOTE — Addendum Note (Signed)
Addended by: Carlene Coria on: 02/12/2022 11:42 AM   Modules accepted: Orders

## 2022-05-27 NOTE — Progress Notes (Unsigned)
Name: [name]   MRN: [id number]    DOB: Feb 07, 1976   Date:05/28/2022       Progress Note  Subjective  Chief Complaint  Nausea/ Stomach Burning  HPI  She has a history of GERD, previously controlled with otc medication, however over the past weeks symptoms has been worse, she has noticed some tickle sensation on her throat, nausea any time of the day and heartburn associated with nausea but no vomiting. Normal appetite, no change in bowel movements or blood in stools. She denies abdominal pain. She was seen by ENT last week and was given Omeprazole 40 mg daily. She denies change in diet, however a couple of weeks ago she was taking ibuprofen a couple times a day.   She has a personal history of h. Pylori infection, she did not take Omeprazole yet today   Patient Active Problem List   Diagnosis Date Noted   Polyp of ascending colon    OSA (obstructive sleep apnea) 11/17/2017   Hypersomnia with sleep apnea 11/17/2017   Chronic tension-type headache, not intractable 05/20/2017   Essential hypertension 05/20/2017   Intermittent low back pain 04/21/2017   GERD (gastroesophageal reflux disease) 03/12/2016   Goiter 03/12/2016   MVP (mitral valve prolapse) 02/15/2015   History of iron deficiency anemia 02/15/2015   Vitamin D deficiency 01/11/2015   Vitamin B12 deficiency 01/11/2015   Pelvic pain 10/26/2012   Heart murmur 12/29/2011   Fibroid uterus 12/29/2011   Family history of breast cancer 12/29/2011    Past Surgical History:  Procedure Laterality Date   BREAST BIOPSY Left 11/17/2019   BENIGN BREAST TISSUE WITH PSEUDOANGIOMATOUS X Clip   COLONOSCOPY WITH PROPOFOL N/A 03/01/2021   Procedure: COLONOSCOPY WITH PROPOFOL;  Surgeon: Virgel Manifold, MD;  Location: [facility] ENDOSCOPY;  Service: Endoscopy;  Laterality: N/A;   LEEP     WISDOM TOOTH EXTRACTION     x4    Family History  Problem Relation Age of Onset   Cancer Mother 66       deceased at age 60 (breast)    Diabetes Mother    Hypertension Mother    Breast cancer Mother 33   Cancer Maternal Aunt 73       breast   Breast cancer Maternal Aunt 31   Cancer Maternal Grandmother 70       pancreatic   Cancer Maternal Grandfather 67       throat   Hypertension Father    Cancer Father 48       prostate   Hypertension Brother    Heart disease Paternal Grandmother        heart attack   Heart disease Paternal Grandfather        heart attack.    Social History   Tobacco Use   Smoking status: Never   Smokeless tobacco: Never  Substance Use Topics   Alcohol use: Yes    Comment: occasionally     Current Outpatient Medications:    Cholecalciferol (VITAMIN D) 2000 units CAPS, Take by mouth., Disp: , Rfl:    fluticasone (FLONASE) 50 MCG/ACT nasal spray, Place 1 spray into both nostrils as needed., Disp: 48 g, Rfl: 1   metoprolol succinate (TOPROL-XL) 25 MG 24 hr tablet, Take 1 tablet (25 mg total) by mouth daily., Disp: 90 tablet, Rfl: 1   omeprazole (PRILOSEC) 40 MG capsule, Take 40 mg by mouth daily., Disp: , Rfl:    levothyroxine (SYNTHROID) 50 MCG tablet, Take 1 tablet by mouth  daily at 12 noon., Disp: , Rfl:   No Known Allergies  I personally reviewed active problem list, medication list, allergies, family history, social history, health maintenance with the patient/caregiver today.   ROS  Ten systems reviewed and is negative except as mentioned in HPI  Objective  Vitals:   05/28/22 1005  BP: 130/76  Pulse: 94  Resp: 16  SpO2: 99%  Weight: 203 lb (92.1 kg)  Height: '5\' 1"'$  (1.549 m)    Body mass index is 38.36 kg/m.  Physical Exam  Constitutional: Patient appears well-developed and well-nourished. Obese  No distress.  HEENT: head atraumatic, normocephalic, pupils equal and reactive to light, neck supple Cardiovascular: Normal rate, regular rhythm and normal heart sounds.  No murmur heard. No BLE edema. Pulmonary/Chest: Effort normal and breath sounds normal. No  respiratory distress. Abdominal: Soft.  There is no tenderness. Psychiatric: Patient has a normal mood and affect. behavior is normal. Judgment and thought content normal.   PHQ2/9:    05/28/2022   10:04 AM 02/12/2022   11:04 AM 08/14/2021   11:14 AM 09/17/2020    2:07 PM 08/22/2019   12:00 PM  Depression screen PHQ 2/9  Decreased Interest 0 0 0 0 0  Down, Depressed, Hopeless 0 0 0 0 0  PHQ - 2 Score 0 0 0 0 0  Altered sleeping 0 0 0  0  Tired, decreased energy 3 0 0  0  Change in appetite 0 0 0  0  Feeling bad or failure about yourself  0 0 0  0  Trouble concentrating 0 0 0  0  Moving slowly or fidgety/restless 0 0 0  0  Suicidal thoughts 0 0 0  0  PHQ-9 Score 3 0 0  0  Difficult doing work/chores     Not difficult at all    phq 9 is negative   Fall Risk:    05/28/2022   10:04 AM 02/12/2022   11:04 AM 08/14/2021   11:14 AM 09/17/2020    2:07 PM 08/22/2019   12:00 PM  [place] in the past year? 0 0 0 0 0  Number falls in past yr: 0 0 0 0 0  Injury with Fall? 0 0 0 0 0  Risk for fall due to : No Fall Risks No Fall Risks No Fall Risks    Follow up Falls prevention discussed Falls prevention discussed Falls prevention discussed        Functional Status Survey: Is the patient deaf or have difficulty hearing?: Yes Does the patient have difficulty seeing, even when wearing glasses/contacts?: No Does the patient have difficulty concentrating, remembering, or making decisions?: No Does the patient have difficulty walking or climbing stairs?: No Does the patient have difficulty dressing or bathing?: No Does the patient have difficulty doing errands alone such as visiting a doctor's office or shopping?: No    Assessment & Plan  1. Gastroesophageal reflux disease without esophagitis  Continue PPI, monitor symptoms with GERD appropriate diet for the next week, symptoms should improve  2. Need for immunization against influenza  - Flu Vaccine QUAD 6+ mos PF IM  (Fluarix Quad PF)  3. Dyspepsia  - H. pylori breath test

## 2022-05-28 ENCOUNTER — Ambulatory Visit: Payer: Commercial Managed Care - PPO | Admitting: Family Medicine

## 2022-05-28 ENCOUNTER — Encounter: Payer: Self-pay | Admitting: Family Medicine

## 2022-05-28 ENCOUNTER — Ambulatory Visit: Payer: Commercial Managed Care - PPO | Attending: Family Medicine

## 2022-05-28 VITALS — BP 130/76 | HR 94 | Resp 16 | Ht 61.0 in | Wt 203.0 lb

## 2022-05-28 DIAGNOSIS — R1013 Epigastric pain: Secondary | ICD-10-CM | POA: Diagnosis not present

## 2022-05-28 DIAGNOSIS — K219 Gastro-esophageal reflux disease without esophagitis: Secondary | ICD-10-CM | POA: Diagnosis not present

## 2022-05-28 DIAGNOSIS — Z23 Encounter for immunization: Secondary | ICD-10-CM

## 2022-05-28 NOTE — Patient Instructions (Signed)
Gastroesophageal Reflux Disease, Adult Gastroesophageal reflux (GER) happens when acid from the stomach flows up into the tube that connects the mouth and the stomach (esophagus). Normally, food travels down the esophagus and stays in the stomach to be digested. However, when a person has GER, food and stomach acid sometimes move back up into the esophagus. If this becomes a more serious problem, the person may be diagnosed with a disease called gastroesophageal reflux disease (GERD). GERD occurs when the reflux: Happens often. Causes frequent or severe symptoms. Causes problems such as damage to the esophagus. When stomach acid comes in contact with the esophagus, the acid may cause inflammation in the esophagus. Over time, GERD may create small holes (ulcers) in the lining of the esophagus. What are the causes? This condition is caused by a problem with the muscle between the esophagus and the stomach (lower esophageal sphincter, or LES). Normally, the LES muscle closes after food passes through the esophagus to the stomach. When the LES is weakened or abnormal, it does not close properly, and that allows food and stomach acid to go back up into the esophagus. The LES can be weakened by certain dietary substances, medicines, and medical conditions, including: Tobacco use. Pregnancy. Having a hiatal hernia. Alcohol use. Certain foods and beverages, such as coffee, chocolate, onions, and peppermint. What increases the risk? You are more likely to develop this condition if you: Have an increased body weight. Have a connective tissue disorder. Take NSAIDs, such as ibuprofen. What are the signs or symptoms? Symptoms of this condition include: Heartburn. Difficult or painful swallowing and the feeling of having a lump in the throat. A bitter taste in the mouth. Bad breath and having a large amount of saliva. Having an upset or bloated stomach and belching. Chest pain. Different conditions can  cause chest pain. Make sure you see your health care provider if you experience chest pain. Shortness of breath or wheezing. Ongoing (chronic) cough or a nighttime cough. Wearing away of tooth enamel. Weight loss. How is this diagnosed? This condition may be diagnosed based on a medical history and a physical exam. To determine if you have mild or severe GERD, your health care provider may also monitor how you respond to treatment. You may also have tests, including: A test to examine your stomach and esophagus with a small camera (endoscopy). A test that measures the acidity level in your esophagus. A test that measures how much pressure is on your esophagus. A barium swallow or modified barium swallow test to show the shape, size, and functioning of your esophagus. How is this treated? Treatment for this condition may vary depending on how severe your symptoms are. Your health care provider may recommend: Changes to your diet. Medicine. Surgery. The goal of treatment is to help relieve your symptoms and to prevent complications. Follow these instructions at home: Eating and drinking  Follow a diet as recommended by your health care provider. This may involve avoiding foods and drinks such as: Coffee and tea, with or without caffeine. Drinks that contain alcohol. Energy drinks and sports drinks. Carbonated drinks or sodas. Chocolate and cocoa. Peppermint and mint flavorings. Garlic and onions. Horseradish. Spicy and acidic foods, including peppers, chili powder, curry powder, vinegar, hot sauces, and barbecue sauce. Citrus fruit juices and citrus fruits, such as oranges, lemons, and limes. Tomato-based foods, such as red sauce, chili, salsa, and pizza with red sauce. Fried and fatty foods, such as donuts, french fries, potato chips, and high-fat dressings.   High-fat meats, such as hot dogs and fatty cuts of red and white meats, such as rib eye steak, sausage, ham, and  bacon. High-fat dairy items, such as whole milk, butter, and cream cheese. Eat small, frequent meals instead of large meals. Avoid drinking large amounts of liquid with your meals. Avoid eating meals during the 2-3 hours before bedtime. Avoid lying down right after you eat. Do not exercise right after you eat. Lifestyle  Do not use any products that contain nicotine or tobacco. These products include cigarettes, chewing tobacco, and vaping devices, such as e-cigarettes. If you need help quitting, ask your health care provider. Try to reduce your stress by using methods such as yoga or meditation. If you need help reducing stress, ask your health care provider. If you are overweight, reduce your weight to an amount that is healthy for you. Ask your health care provider for guidance about a safe weight loss goal. General instructions Pay attention to any changes in your symptoms. Take over-the-counter and prescription medicines only as told by your health care provider. Do not take aspirin, ibuprofen, or other NSAIDs unless your health care provider told you to take these medicines. Wear loose-fitting clothing. Do not wear anything tight around your waist that causes pressure on your abdomen. Raise (elevate) the head of your bed about 6 inches (15 cm). You can use a wedge to do this. Avoid bending over if this makes your symptoms worse. Keep all follow-up visits. This is important. Contact a health care provider if: You have: New symptoms. Unexplained weight loss. Difficulty swallowing or it hurts to swallow. Wheezing or a persistent cough. A hoarse voice. Your symptoms do not improve with treatment. Get help right away if: You have sudden pain in your arms, neck, jaw, teeth, or back. You suddenly feel sweaty, dizzy, or light-headed. You have chest pain or shortness of breath. You vomit and the vomit is green, yellow, or black, or it looks like blood or coffee grounds. You faint. You  have stool that is red, bloody, or black. You cannot swallow, drink, or eat. These symptoms may represent a serious problem that is an emergency. Do not wait to see if the symptoms will go away. Get medical help right away. Call your local emergency services (911 in the [place] Do not drive yourself to the hospital. Summary Gastroesophageal reflux happens when acid from the stomach flows up into the esophagus. GERD is a disease in which the reflux happens often, causes frequent or severe symptoms, or causes problems such as damage to the esophagus. Treatment for this condition may vary depending on how severe your symptoms are. Your health care provider may recommend diet and lifestyle changes, medicine, or surgery. Contact a health care provider if you have new or worsening symptoms. Take over-the-counter and prescription medicines only as told by your health care provider. Do not take aspirin, ibuprofen, or other NSAIDs unless your health care provider told you to do so. Keep all follow-up visits as told by your health care provider. This is important. This information is not intended to replace advice given to you by your health care provider. Make sure you discuss any questions you have with your health care provider. Document Revised: 01/02/2020 Document Reviewed: 01/02/2020 [employer] Patient Education  2023 [employer]  

## 2022-07-28 ENCOUNTER — Encounter: Payer: Self-pay | Admitting: Family Medicine

## 2022-08-06 ENCOUNTER — Other Ambulatory Visit: Payer: Self-pay | Admitting: Family Medicine

## 2022-08-06 DIAGNOSIS — R1013 Epigastric pain: Secondary | ICD-10-CM

## 2022-08-06 DIAGNOSIS — K219 Gastro-esophageal reflux disease without esophagitis: Secondary | ICD-10-CM

## 2022-08-06 LAB — H. PYLORI BREATH TEST: H pylori Breath Test: NEGATIVE

## 2022-08-20 LAB — CBC WITH DIFFERENTIAL/PLATELET
Basophils Absolute: 0 10*3/uL (ref 0.0–0.2)
Basos: 1 %
EOS (ABSOLUTE): 0.1 10*3/uL (ref 0.0–0.4)
Eos: 2 %
Hematocrit: 39.3 % (ref 34.0–46.6)
Hemoglobin: 12.3 g/dL (ref 11.1–15.9)
Immature Grans (Abs): 0 10*3/uL (ref 0.0–0.1)
Immature Granulocytes: 1 %
Lymphocytes Absolute: 2.8 10*3/uL (ref 0.7–3.1)
Lymphs: 43 %
MCH: 26.2 pg — ABNORMAL LOW (ref 26.6–33.0)
MCHC: 31.3 g/dL — ABNORMAL LOW (ref 31.5–35.7)
MCV: 84 fL (ref 79–97)
Monocytes Absolute: 0.6 10*3/uL (ref 0.1–0.9)
Monocytes: 10 %
Neutrophils Absolute: 2.7 10*3/uL (ref 1.4–7.0)
Neutrophils: 43 %
Platelets: 361 10*3/uL (ref 150–450)
RBC: 4.7 x10E6/uL (ref 3.77–5.28)
RDW: 13.1 % (ref 11.7–15.4)
WBC: 6.3 10*3/uL (ref 3.4–10.8)

## 2022-08-20 LAB — COMPREHENSIVE METABOLIC PANEL
ALT: 18 IU/L (ref 0–32)
AST: 17 IU/L (ref 0–40)
Albumin/Globulin Ratio: 1.7 (ref 1.2–2.2)
Albumin: 4.6 g/dL (ref 3.9–4.9)
Alkaline Phosphatase: 86 IU/L (ref 44–121)
BUN/Creatinine Ratio: 12 (ref 9–23)
BUN: 9 mg/dL (ref 6–24)
Bilirubin Total: 0.5 mg/dL (ref 0.0–1.2)
CO2: 18 mmol/L — ABNORMAL LOW (ref 20–29)
Calcium: 9.3 mg/dL (ref 8.7–10.2)
Chloride: 104 mmol/L (ref 96–106)
Creatinine, Ser: 0.77 mg/dL (ref 0.57–1.00)
Globulin, Total: 2.7 g/dL (ref 1.5–4.5)
Glucose: 86 mg/dL (ref 70–99)
Potassium: 4 mmol/L (ref 3.5–5.2)
Sodium: 142 mmol/L (ref 134–144)
Total Protein: 7.3 g/dL (ref 6.0–8.5)
eGFR: 95 mL/min/{1.73_m2} (ref >59)

## 2022-08-20 LAB — LIPID PANEL
Chol/HDL Ratio: 4.1 ratio (ref 0.0–4.4)
Cholesterol, Total: 204 mg/dL — ABNORMAL HIGH (ref 100–199)
HDL: 50 mg/dL (ref >39)
LDL Chol Calc (NIH): 134 mg/dL — ABNORMAL HIGH (ref 0–99)
Triglycerides: 109 mg/dL (ref 0–149)
VLDL Cholesterol Cal: 20 mg/dL (ref 5–40)

## 2022-08-20 LAB — HEMOGLOBIN A1C
Est. average glucose Bld gHb Est-mCnc: 105 mg/dL
Hgb A1c MFr Bld: 5.3 % (ref 4.8–5.6)

## 2022-08-20 LAB — VITAMIN D 25 HYDROXY (VIT D DEFICIENCY, FRACTURES): Vit D, 25-Hydroxy: 28.8 ng/mL — ABNORMAL LOW (ref 30.0–100.0)

## 2022-08-20 LAB — VITAMIN B12

## 2022-08-29 ENCOUNTER — Other Ambulatory Visit: Payer: Self-pay | Admitting: Family Medicine

## 2022-08-29 DIAGNOSIS — Z1231 Encounter for screening mammogram for malignant neoplasm of breast: Secondary | ICD-10-CM

## 2022-10-03 ENCOUNTER — Ambulatory Visit: Payer: Self-pay | Admitting: *Deleted

## 2022-10-03 ENCOUNTER — Ambulatory Visit: Payer: Commercial Managed Care - PPO | Admitting: Family Medicine

## 2022-10-03 ENCOUNTER — Encounter: Payer: Self-pay | Admitting: Family Medicine

## 2022-10-03 VITALS — BP 130/76 | HR 100 | Temp 97.7°F | Resp 16 | Ht 61.0 in | Wt 200.4 lb

## 2022-10-03 DIAGNOSIS — R1013 Epigastric pain: Secondary | ICD-10-CM

## 2022-10-03 DIAGNOSIS — K219 Gastro-esophageal reflux disease without esophagitis: Secondary | ICD-10-CM | POA: Diagnosis not present

## 2022-10-03 DIAGNOSIS — R110 Nausea: Secondary | ICD-10-CM | POA: Diagnosis not present

## 2022-10-03 MED ORDER — SUCRALFATE 1 G PO TABS: 1.0000 g | ORAL_TABLET | Freq: Three times a day (TID) | ORAL | 0 refills | Status: DC

## 2022-10-03 MED ORDER — FAMOTIDINE 40 MG PO TABS: 40.0000 mg | ORAL_TABLET | Freq: Every day | ORAL | 0 refills | Status: DC

## 2022-10-03 NOTE — Progress Notes (Unsigned)
Name: [name]   MRN: [id number]    DOB: 04-29-1976   Date:10/03/2022       Progress Note  Subjective  Chief Complaint  Abdominal Pain/ Nausea   HPI  Abdominal pain: she  continues to have epigastric pain, heartburn and indigestion , she has been taking PPI given by ENT without help. Recently also having some regurgitation and intermittent vomiting, seems worse in the mornings and not after meals. No fever or chills. She has an appointment scheduled to see GI  Patient Active Problem List   Diagnosis Date Noted   Polyp of ascending colon    OSA (obstructive sleep apnea) 11/17/2017   Hypersomnia with sleep apnea 11/17/2017   Chronic tension-type headache, not intractable 05/20/2017   Essential hypertension 05/20/2017   Intermittent low back pain 04/21/2017   GERD (gastroesophageal reflux disease) 03/12/2016   Goiter 03/12/2016   MVP (mitral valve prolapse) 02/15/2015   History of iron deficiency anemia 02/15/2015   Vitamin D deficiency 01/11/2015   Vitamin B12 deficiency 01/11/2015   Pelvic pain 10/26/2012   Heart murmur 12/29/2011   Fibroid uterus 12/29/2011   Family history of breast cancer 12/29/2011    Past Surgical History:  Procedure Laterality Date   BREAST BIOPSY Left 11/17/2019   BENIGN BREAST TISSUE WITH PSEUDOANGIOMATOUS X Clip   COLONOSCOPY WITH PROPOFOL N/A 03/01/2021   Procedure: COLONOSCOPY WITH PROPOFOL;  Surgeon: Virgel Manifold, MD;  Location: [facility] ENDOSCOPY;  Service: Endoscopy;  Laterality: N/A;   LEEP     WISDOM TOOTH EXTRACTION     x4    Family History  Problem Relation Age of Onset   Cancer Mother 35       deceased at age 63 (breast)   Diabetes Mother    Hypertension Mother    Breast cancer Mother 55   Cancer Maternal Aunt 49       breast   Breast cancer Maternal Aunt 31   Cancer Maternal Grandmother 70       pancreatic   Cancer Maternal Grandfather 67       throat   Hypertension Father    Cancer Father 4       prostate    Hypertension Brother    Heart disease Paternal Grandmother        heart attack   Heart disease Paternal Grandfather        heart attack.    Social History   Tobacco Use   Smoking status: Never   Smokeless tobacco: Never  Substance Use Topics   Alcohol use: Yes    Comment: occasionally     Current Outpatient Medications:    Cholecalciferol (VITAMIN D) 2000 units CAPS, Take by mouth., Disp: , Rfl:    famotidine (PEPCID) 40 MG tablet, Take 1 tablet (40 mg total) by mouth daily., Disp: 60 tablet, Rfl: 0   fluticasone (FLONASE) 50 MCG/ACT nasal spray, Place 1 spray into both nostrils as needed., Disp: 48 g, Rfl: 1   metoprolol succinate (TOPROL-XL) 25 MG 24 hr tablet, Take 1 tablet (25 mg total) by mouth daily., Disp: 90 tablet, Rfl: 1   sucralfate (CARAFATE) 1 g tablet, Take 1 tablet (1 g total) by mouth 4 (four) times daily -  with meals and at bedtime., Disp: 120 tablet, Rfl: 0   levothyroxine (SYNTHROID) 50 MCG tablet, Take 1 tablet by mouth daily at 12 noon., Disp: , Rfl:   No Known Allergies  I personally reviewed active problem list, medication list, allergies, family  history, social history, health maintenance with the patient/caregiver today.   ROS  Ten systems reviewed and is negative except as mentioned in HPI   Objective  Vitals:   10/03/22 1510  BP: 130/76  Pulse: 100  Resp: 16  Temp: 97.7 F (36.5 C)  TempSrc: Oral  SpO2: 100%  Weight: 200 lb 6.4 oz (90.9 kg)  Height: 5\' 1"  (1.549 m)    Body mass index is 37.87 kg/m.  Physical Exam  Constitutional: Patient appears well-developed and well-nourished. Obese  No distress.  HEENT: head atraumatic, normocephalic, pupils equal and reactive to light, neck supple Cardiovascular: Normal rate, regular rhythm and normal heart sounds.  No murmur heard. No BLE edema. Pulmonary/Chest: Effort normal and breath sounds normal. No respiratory distress. Abdominal: Soft.  There is epigastric   tenderness. Psychiatric: Patient has a normal mood and affect. behavior is normal. Judgment and thought content normal.    PHQ2/9:    10/03/2022    3:12 PM 05/28/2022   10:04 AM 02/12/2022   11:04 AM 08/14/2021   11:14 AM 09/17/2020    2:07 PM  Depression screen PHQ 2/9  Decreased Interest 0 0 0 0 0  Down, Depressed, Hopeless 0 0 0 0 0  PHQ - 2 Score 0 0 0 0 0  Altered sleeping 0 0 0 0   Tired, decreased energy 0 3 0 0   Change in appetite 0 0 0 0   Feeling bad or failure about yourself  0 0 0 0   Trouble concentrating 0 0 0 0   Moving slowly or fidgety/restless 0 0 0 0   Suicidal thoughts 0 0 0 0   PHQ-9 Score 0 3 0 0     phq 9 is negative   Fall Risk:    10/03/2022    3:12 PM 05/28/2022   10:04 AM 02/12/2022   11:04 AM 08/14/2021   11:14 AM 09/17/2020    2:07 PM  Fall Risk   Falls in the past year? 0 0 0 0 0  Number falls in past yr:  0 0 0 0  Injury with Fall?  0 0 0 0  Risk for fall due to : No Fall Risks No Fall Risks No Fall Risks No Fall Risks   Follow up Falls prevention discussed Falls prevention discussed Falls prevention discussed Falls prevention discussed       Functional Status Survey: Is the patient deaf or have difficulty hearing?: No Does the patient have difficulty seeing, even when wearing glasses/contacts?: No Does the patient have difficulty concentrating, remembering, or making decisions?: No Does the patient have difficulty walking or climbing stairs?: No Does the patient have difficulty dressing or bathing?: No Does the patient have difficulty doing errands alone such as visiting a doctor's office or shopping?: No    Assessment & Plan  1. Nausea  - sucralfate (CARAFATE) 1 g tablet; Take 1 tablet (1 g total) by mouth 4 (four) times daily -  with meals and at bedtime.  Dispense: 120 tablet; Refill: 0 - famotidine (PEPCID) 40 MG tablet; Take 1 tablet (40 mg total) by mouth daily.  Dispense: 60 tablet; Refill: 0  2. GERD without esophagitis  -  sucralfate (CARAFATE) 1 g tablet; Take 1 tablet (1 g total) by mouth 4 (four) times daily -  with meals and at bedtime.  Dispense: 120 tablet; Refill: 0 - famotidine (PEPCID) 40 MG tablet; Take 1 tablet (40 mg total) by mouth daily.  Dispense: 60 tablet;  Refill: 0  3. Epigastric pain  - sucralfate (CARAFATE) 1 g tablet; Take 1 tablet (1 g total) by mouth 4 (four) times daily -  with meals and at bedtime.  Dispense: 120 tablet; Refill: 0 - famotidine (PEPCID) 40 MG tablet; Take 1 tablet (40 mg total) by mouth daily.  Dispense: 60 tablet; Refill: 0

## 2022-10-03 NOTE — Telephone Encounter (Signed)
Reason for Disposition  [1] MODERATE pain (e.g., interferes with normal activities) AND [2] pain comes and goes (cramps) AND [3] present > 24 hours  (Exception: Pain with Vomiting or Diarrhea - see that Guideline.)  Answer Assessment - Initial Assessment Questions 1. LOCATION: "Where does it hurt?"      I'm having abd pain at the top of my stomach in the center.    No vomiting or diarrhea just nausea.    Pain been going on for a couple of months.   I mentioned this with my last visit with PCP.   It was burning then.    I was put on acid reflux medicine.   The burning is gone but now cramping.  Nausea is intermittent 2. RADIATION: "Does the pain shoot anywhere else?" (e.g., chest, back)     I've had pain in my back 3. ONSET: "When did the pain begin?" (e.g., minutes, hours or days ago)      A couple of months now 4. SUDDEN: "Gradual or sudden onset?"     Not asked 5. PATTERN "Does the pain come and go, or is it constant?"    - If it comes and goes: "How long does it last?" "Do you have pain now?"     (Note: Comes and goes means the pain is intermittent. It goes away completely between bouts.)    - If constant: "Is it getting better, staying the same, or getting worse?"      (Note: Constant means the pain never goes away completely; most serious pain is constant and gets worse.)      Intermittent 6. SEVERITY: "How bad is the pain?"  (e.g., Scale 1-10; mild, moderate, or severe)    - MILD (1-3): Doesn't interfere with normal activities, abdomen soft and not tender to touch.     - MODERATE (4-7): Interferes with normal activities or awakens from sleep, abdomen tender to touch.     - SEVERE (8-10): Excruciating pain, doubled over, unable to do any normal activities.       5/10  Moderate 7. RECURRENT SYMPTOM: "Have you ever had this type of stomach pain before?" If Yes, ask: "When was the last time?" and "What happened that time?"      No 8. CAUSE: "What do you think is causing the stomach  pain?"     Not asked 9. RELIEVING/AGGRAVATING FACTORS: "What makes it better or worse?" (e.g., antacids, bending or twisting motion, bowel movement)     nothing 10. OTHER SYMPTOMS: "Do you have any other symptoms?" (e.g., back pain, diarrhea, fever, urination pain, vomiting)       Early part of the week felt like I was coming  down with a cold.   I'm taking medicine for the cold.   My Covid is negative.   I'm not feeling well.   My whole body is tired.   Taking frequent naps. 11. PREGNANCY: "Is there any chance you are pregnant?" "When was your last menstrual period?"       No  Protocols used: Abdominal Pain - [facility]

## 2022-10-03 NOTE — Patient Instructions (Signed)
Food Choices for Gastroesophageal Reflux Disease, Adult When you have gastroesophageal reflux disease (GERD), the foods you eat and your eating habits are very important. Choosing the right foods can help ease the discomfort of GERD. Consider working with a dietitian to help you make healthy food choices. What are tips for following this plan? Reading food labels Look for foods that are low in saturated fat. Foods that have less than 5% of daily value (DV) of fat and 0 g of trans fats may help with your symptoms. Cooking Cook foods using methods other than frying. This may include baking, steaming, grilling, or broiling. These are all methods that do not need a lot of fat for cooking. To add flavor, try to use herbs that are low in spice and acidity. Meal planning  Choose healthy foods that are low in fat, such as fruits, vegetables, whole grains, low-fat dairy products, lean meats, fish, and poultry. Eat frequent, small meals instead of three large meals each day. Eat your meals slowly, in a relaxed setting. Avoid bending over or lying down until 2-3 hours after eating. Limit high-fat foods such as fatty meats or fried foods. Limit your intake of fatty foods, such as oils, butter, and shortening. Avoid the following as told by your health care provider: Foods that cause symptoms. These may be different for different people. Keep a food diary to keep track of foods that cause symptoms. Alcohol. Drinking large amounts of liquid with meals. Eating meals during the 2-3 hours before bed. Lifestyle Maintain a healthy weight. Ask your health care provider what weight is healthy for you. If you need to lose weight, work with your health care provider to do so safely. Exercise for at least 30 minutes on 5 or more days each week, or as told by your health care provider. Avoid wearing clothes that fit tightly around your waist and chest. Do not use any products that contain nicotine or tobacco. These  products include cigarettes, chewing tobacco, and vaping devices, such as e-cigarettes. If you need help quitting, ask your health care provider. Sleep with the head of your bed raised. Use a wedge under the mattress or blocks under the bed frame to raise the head of the bed. Chew sugar-free gum after mealtimes. What foods should I eat?  Eat a healthy, well-balanced diet of fruits, vegetables, whole grains, low-fat dairy products, lean meats, fish, and poultry. Each person is different. Foods that may trigger symptoms in one person may not trigger any symptoms in another person. Work with your health care provider to identify foods that are safe for you. The items listed above may not be a complete list of recommended foods and beverages. Contact a dietitian for more information. What foods should I avoid? Limiting some of these foods may help manage the symptoms of GERD. Everyone is different. Consult a dietitian or your health care provider to help you identify the exact foods to avoid, if any. Fruits Any fruits prepared with added fat. Any fruits that cause symptoms. For some people this may include citrus fruits, such as oranges, grapefruit, pineapple, and lemons. Vegetables Deep-fried vegetables. French fries. Any vegetables prepared with added fat. Any vegetables that cause symptoms. For some people, this may include tomatoes and tomato products, chili peppers, onions and garlic, and horseradish. Grains Pastries or quick breads with added fat. Meats and other proteins High-fat meats, such as fatty beef or pork, hot dogs, ribs, ham, sausage, salami, and bacon. Fried meat or protein, including   fried fish and fried chicken. Nuts and nut butters, in large amounts. Dairy Whole milk and chocolate milk. Sour cream. Cream. Ice cream. Cream cheese. Milkshakes. Fats and oils Butter. Margarine. Shortening. Ghee. Beverages Coffee and tea, with or without caffeine. Carbonated beverages. Sodas. Energy  drinks. Fruit juice made with acidic fruits, such as orange or grapefruit. Tomato juice. Alcoholic drinks. Sweets and desserts Chocolate and cocoa. Donuts. Seasonings and condiments Pepper. Peppermint and spearmint. Added salt. Any condiments, herbs, or seasonings that cause symptoms. For some people, this may include curry, hot sauce, or vinegar-based salad dressings. The items listed above may not be a complete list of foods and beverages to avoid. Contact a dietitian for more information. Questions to ask your health care provider Diet and lifestyle changes are usually the first steps that are taken to manage symptoms of GERD. If diet and lifestyle changes do not improve your symptoms, talk with your health care provider about taking medicines. Where to find more information [employer]: aboutgerd.org Summary When you have gastroesophageal reflux disease (GERD), food and lifestyle choices may be very helpful in easing the discomfort of GERD. Eat frequent, small meals instead of three large meals each day. Eat your meals slowly, in a relaxed setting. Avoid bending over or lying down until 2-3 hours after eating. Limit high-fat foods such as fatty meats or fried foods. This information is not intended to replace advice given to you by your health care provider. Make sure you discuss any questions you have with your health care provider. Document Revised: 01/02/2020 Document Reviewed: 01/02/2020 Elsevier Patient Education  2023 [employer] Inc.  

## 2022-10-03 NOTE — Telephone Encounter (Signed)
  Chief Complaint: abd pain and nausea Symptoms: Abd pain in top middle section and nausea.  Feeling very tired. Frequency: For 2 months Pertinent Negatives: Patient denies vomiting or diarrhea Disposition: [] ED /[] Urgent Care (no appt availability in office) / [x] Appointment(In office/virtual)/ []  [place] [facility]/ [] Home Care/ [] Refused Recommended Disposition /[] [facility]/ []  Follow-up with PCP Additional Notes: Appt. Made for today with Dr. Ancil Boozer at 3:00.

## 2022-10-07 ENCOUNTER — Encounter: Payer: Self-pay | Admitting: Emergency Medicine

## 2022-10-07 ENCOUNTER — Emergency Department
Admission: EM | Admit: 2022-10-07 | Discharge: 2022-10-07 | Disposition: A | Discharge status: Home / Self Care | Payer: Commercial Managed Care - PPO | Attending: Emergency Medicine | Admitting: Emergency Medicine

## 2022-10-07 ENCOUNTER — Emergency Department: Payer: Commercial Managed Care - PPO

## 2022-10-07 ENCOUNTER — Other Ambulatory Visit: Payer: Self-pay

## 2022-10-07 DIAGNOSIS — K2900 Acute gastritis without bleeding: Secondary | ICD-10-CM

## 2022-10-07 DIAGNOSIS — R1010 Upper abdominal pain, unspecified: Secondary | ICD-10-CM | POA: Diagnosis present

## 2022-10-07 DIAGNOSIS — R1013 Epigastric pain: Secondary | ICD-10-CM | POA: Insufficient documentation

## 2022-10-07 DIAGNOSIS — I10 Essential (primary) hypertension: Secondary | ICD-10-CM | POA: Diagnosis not present

## 2022-10-07 DIAGNOSIS — R112 Nausea with vomiting, unspecified: Secondary | ICD-10-CM | POA: Diagnosis not present

## 2022-10-07 LAB — CBC
HCT: 41.1 % (ref 36.0–46.0)
Hemoglobin: 12.5 g/dL (ref 12.0–15.0)
MCH: 25.8 pg — ABNORMAL LOW (ref 26.0–34.0)
MCHC: 30.4 g/dL (ref 30.0–36.0)
MCV: 84.9 fL (ref 80.0–100.0)
Platelets: 261 10*3/uL (ref 150–400)
RBC: 4.84 MIL/uL (ref 3.87–5.11)
RDW: 15.1 % (ref 11.5–15.5)
WBC: 6.6 10*3/uL (ref 4.0–10.5)
nRBC: 0 % (ref 0.0–0.2)

## 2022-10-07 LAB — URINALYSIS, ROUTINE W REFLEX MICROSCOPIC
Bacteria, UA: NONE SEEN
Bilirubin Urine: NEGATIVE
Glucose, UA: NEGATIVE mg/dL
Hgb urine dipstick: NEGATIVE
Ketones, ur: NEGATIVE mg/dL
Leukocytes,Ua: NEGATIVE
Nitrite: NEGATIVE
Protein, ur: NEGATIVE mg/dL
Specific Gravity, Urine: 1.006 (ref 1.005–1.030)
pH: 6 (ref 5.0–8.0)

## 2022-10-07 LAB — COMPREHENSIVE METABOLIC PANEL
ALT: 22 U/L (ref 0–44)
AST: 24 U/L (ref 15–41)
Albumin: 3.9 g/dL (ref 3.5–5.0)
Alkaline Phosphatase: 65 U/L (ref 38–126)
Anion gap: 10 (ref 5–15)
BUN: 10 mg/dL (ref 6–20)
CO2: 22 mmol/L (ref 22–32)
Calcium: 8.9 mg/dL (ref 8.9–10.3)
Chloride: 105 mmol/L (ref 98–111)
Creatinine, Ser: 0.82 mg/dL (ref 0.44–1.00)
GFR, Estimated: >60 mL/min (ref >60)
Glucose, Bld: 89 mg/dL (ref 70–99)
Potassium: 4 mmol/L (ref 3.5–5.1)
Sodium: 137 mmol/L (ref 135–145)
Total Bilirubin: 0.9 mg/dL (ref 0.3–1.2)
Total Protein: 7.3 g/dL (ref 6.5–8.1)

## 2022-10-07 LAB — LIPASE, BLOOD: Lipase: 33 U/L (ref 11–51)

## 2022-10-07 LAB — PREGNANCY, URINE: Preg Test, Ur: NEGATIVE

## 2022-10-07 MED ORDER — SODIUM CHLORIDE 0.9 % IV BOLUS
1000.0000 mL | Freq: Once | INTRAVENOUS | Status: AC
Start: 2022-10-07 — End: 2022-10-07
  Administered 2022-10-07: 1000 mL via INTRAVENOUS

## 2022-10-07 MED ORDER — ONDANSETRON HCL 4 MG/2ML IJ SOLN
4.0000 mg | Freq: Once | INTRAMUSCULAR | Status: AC
Start: 2022-10-07 — End: 2022-10-07
  Administered 2022-10-07: 4 mg via INTRAVENOUS
  Filled 2022-10-07: qty 2

## 2022-10-07 MED ORDER — PANTOPRAZOLE SODIUM 40 MG PO TBEC: 40.0000 mg | DELAYED_RELEASE_TABLET | Freq: Every day | ORAL | 1 refills | Status: DC

## 2022-10-07 MED ORDER — SUCRALFATE 1 G PO TABS: 1.0000 g | ORAL_TABLET | Freq: Four times a day (QID) | ORAL | 0 refills | Status: DC

## 2022-10-07 MED ORDER — IOHEXOL 300 MG/ML  SOLN
100.0000 mL | Freq: Once | INTRAMUSCULAR | Status: AC | PRN
  Administered 2022-10-07: 100 mL via INTRAVENOUS

## 2022-10-07 MED ORDER — ONDANSETRON 4 MG PO TBDP: 4.0000 mg | ORAL_TABLET | Freq: Three times a day (TID) | ORAL | 0 refills | Status: DC | PRN

## 2022-10-07 NOTE — ED Provider Notes (Signed)
[facility] Provider Note    Event Date/Time   First MD Initiated Contact with Patient 10/07/22 1531     (approximate)  History   Chief Complaint: Abdominal Pain  HPI  [name] is a 47 y.o. female with a past medical history of anemia, gastric reflux, hypertension, presents to the emergency department for abdominal pain.  According to the patient for the past 3 weeks now she has been experiencing upper abdominal discomfort as well as intermittent nausea and vomiting.  Patient describes more of a vague dull discomfort across the entire upper abdomen.  No fever.  No urinary symptoms.  No diarrhea.  Patient states no improvement in the symptoms so she came to the emergency department today for evaluation.  Physical Exam   Triage Vital Signs: ED Triage Vitals  Enc Vitals Group     BP 10/07/22 1336 (!) 162/86     Pulse Rate 10/07/22 1336 87     Resp 10/07/22 1336 17     Temp 10/07/22 1336 98.4 F (36.9 C)     Temp Source 10/07/22 1336 Oral     SpO2 10/07/22 1336 97 %     Weight 10/07/22 1337 198 lb (89.8 kg)     Height 10/07/22 1337 5\' 1"  (1.549 m)     Head Circumference --      Peak Flow --      Pain Score 10/07/22 1341 3     Pain Loc --      Pain Edu? --      Excl. in [place]? --     Most recent vital signs: Vitals:   10/07/22 1336  BP: (!) 162/86  Pulse: 87  Resp: 17  Temp: 98.4 F (36.9 C)  SpO2: 97%    General: Awake, no distress.  CV:  Good peripheral perfusion.  Regular rate and rhythm  Resp:  Normal effort.  Equal breath sounds bilaterally.  Abd:  No distention.  Soft, mild epigastric tenderness to palpation otherwise benign abdomen.  No rebound or guarding.  ED Results / Procedures / Treatments   RADIOLOGY  I have reviewed and interpreted CT images I do not see any obvious significant normality such as obstruction on my evaluation. Radiology is read the CT scan is negative.   MEDICATIONS ORDERED IN ED: Medications   sodium chloride 0.9 % bolus 1,000 mL (has no administration in time range)  ondansetron (ZOFRAN) injection 4 mg (has no administration in time range)     IMPRESSION / MDM / ASSESSMENT AND PLAN / ED COURSE  I reviewed the triage vital signs and the nursing notes.  Patient's presentation is most consistent with acute presentation with potential threat to life or bodily function.  Patient presents emergency department for abdominal discomfort and nausea.  Overall patient appears well does have mild epigastric tenderness to palpation otherwise reassuring physical exam.  Patient's lab work is resulted largely within normal limits with a normal CBC including normal white blood cell count, reassuring chemistry, normal lipase, negative pregnancy test, normal urinalysis.  However given the patient's 2 to 3 weeks of symptoms with upper abdominal discomfort we will proceed with CT imaging to evaluate for intra-abdominal pathology.  Differential would include gastritis, gastroenteritis, colitis, less likely diverticulitis, biliary disease.  Patient's lab work overall reassuring with a normal CBC and normal white blood cell count, reassuring chemistry, normal lipase, normal LFTs.  Pregnancy test is negative, urinalysis is normal.  CT scan has resulted showing no concerning findings.  Given the patient's reassuring workup I believe the patient is safe for discharge home with outpatient follow-up.  However given the patient's upper abdominal discomfort for the past 3 weeks we will treat like gastritis with sucralfate, Protonix have the patient follow-up with GI medicine.  FINAL CLINICAL IMPRESSION(S) / ED DIAGNOSES   Upper abdominal pain Nausea vomiting  Rx / DC Orders   Zofran  Note:  This document was prepared using Dragon voice recognition software and may include unintentional dictation errors.   Harvest Dark, MD 10/07/22 1758

## 2022-10-07 NOTE — ED Triage Notes (Signed)
Pt via POV from home. Pt c/o upper abd pain and lower back pain for the past 2 weeks. Denies any urinary symptoms but states that she has been having some nausea and vomiting. Pt is A&OX4 and NAD

## 2022-10-08 ENCOUNTER — Other Ambulatory Visit: Payer: Self-pay | Admitting: Family Medicine

## 2022-10-08 DIAGNOSIS — G44229 Chronic tension-type headache, not intractable: Secondary | ICD-10-CM

## 2022-10-08 DIAGNOSIS — I10 Essential (primary) hypertension: Secondary | ICD-10-CM

## 2022-10-15 ENCOUNTER — Encounter: Payer: Self-pay | Admitting: Gastroenterology

## 2022-10-15 ENCOUNTER — Ambulatory Visit: Payer: Commercial Managed Care - PPO | Admitting: Gastroenterology

## 2022-10-15 VITALS — BP 141/89 | HR 112 | Temp 98.3°F | Ht 61.0 in | Wt 204.4 lb

## 2022-10-15 DIAGNOSIS — R1013 Epigastric pain: Secondary | ICD-10-CM

## 2022-10-15 MED ORDER — ESOMEPRAZOLE MAGNESIUM 40 MG PO CPDR: 40.0000 mg | DELAYED_RELEASE_CAPSULE | Freq: Every day | ORAL | 3 refills | Status: DC

## 2022-10-15 NOTE — Progress Notes (Signed)
Wyline Mood MD, MRCP(U.K) [address]  [address]  [place], [place] [address]  Main: [phone]  Fax: [phone]   Gastroenterology Consultation  Referring Provider:     Alba Cory, MD Primary Care Physician:  Alba Cory, MD Primary Gastroenterologist:  Dr. Wyline Mood  Reason for Consultation:     GERD        HPI:   [name] is a 47 y.o. y/o female referred for consultation & management  by Dr. Carlynn Purl, Danna Hefty, MD.    She says been having epigastric discomfort for about a few months.  Burning in nature localized to upper part of the abdomen no clear aggravating factors such as meals.  Sometimes better with a bowel movement which she has 3 times a week denies being constipated.  Denies any NSAID use.  Denies unintentional weight loss.  Tried Protonix for a few days but caused her to get back pain and stopped tried Pepcid did not work.  Denies any gas or bloating.  Denies any prior endoscopy. She was seen in the emergency room on 10/07/2022 with upper abdominal pain going on for 3 weeks duration.  CT scan of the abdomen showed no acute abnormalities.  Hemoglobin and CMP, lipase were normal.  H. pylori breath test in January 2024 was negative.  She was prescribed Carafate and Protonix 40 mg od and asked to follow-up with GI.  She has had a colonoscopy in 2022 by Dr. Maximino Greenland.  Showed a 3 mm polyp in the ascending colon otherwise no other abnormalities.  The polyp was a tubular adenoma.    Past Medical History:  Diagnosis Date   Abnormal Pap smear    Anemia    during pregnancy   GERD (gastroesophageal reflux disease)    Heart murmur    Hypertension    boderline   MVP (mitral valve prolapse)    Obesity    Uterine fibroid     Past Surgical History:  Procedure Laterality Date   BREAST BIOPSY Left 11/17/2019   BENIGN BREAST TISSUE WITH PSEUDOANGIOMATOUS X Clip   COLONOSCOPY WITH PROPOFOL N/A 03/01/2021   Procedure: COLONOSCOPY WITH PROPOFOL;   Surgeon: Pasty Spillers, MD;  Location: [facility] ENDOSCOPY;  Service: Endoscopy;  Laterality: N/A;   LEEP     WISDOM TOOTH EXTRACTION     x4    Prior to Admission medications   Medication Sig Start Date End Date Taking? Authorizing Provider  Cholecalciferol (VITAMIN D) 2000 units CAPS Take by mouth.    [provider]  famotidine (PEPCID) 40 MG tablet Take 1 tablet (40 mg total) by mouth daily. 10/03/22   Alba Cory, MD  fluticasone (FLONASE) 50 MCG/ACT nasal spray Place 1 spray into both nostrils as needed. 08/14/21   Alba Cory, MD  levothyroxine (SYNTHROID) 50 MCG tablet Take 1 tablet by mouth daily at 12 noon. 09/10/20 10/30/21  Sherlon Handing, MD  metoprolol succinate (TOPROL-XL) 25 MG 24 hr tablet TAKE 1 TABLET (25 MG TOTAL) BY MOUTH DAILY. 10/08/22   Alba Cory, MD  ondansetron (ZOFRAN-ODT) 4 MG disintegrating tablet Take 1 tablet (4 mg total) by mouth every 8 (eight) hours as needed for nausea or vomiting. 10/07/22   Minna Antis, MD  pantoprazole (PROTONIX) 40 MG tablet Take 1 tablet (40 mg total) by mouth daily. 10/07/22 10/07/23  Minna Antis, MD  sucralfate (CARAFATE) 1 g tablet Take 1 tablet (1 g total) by mouth 4 (four) times daily. 10/07/22 10/07/23  Minna Antis, MD  Family History  Problem Relation Age of Onset   Cancer Mother 79       deceased at age 54 (breast)   Diabetes Mother    Hypertension Mother    Breast cancer Mother 60   Cancer Maternal Aunt 54       breast   Breast cancer Maternal Aunt 31   Cancer Maternal Grandmother 74       pancreatic   Cancer Maternal Grandfather 67       throat   Hypertension Father    Cancer Father 49       prostate   Hypertension Brother    Heart disease Paternal Grandmother        heart attack   Heart disease Paternal Grandfather        heart attack.     Social History   Tobacco Use   Smoking status: Never   Smokeless tobacco: Never  Vaping Use   Vaping Use: Never used  Substance  Use Topics   Alcohol use: Yes    Comment: occasionally   Drug use: No    Allergies as of 10/15/2022   (Not on File)    Review of Systems:    All systems reviewed and negative except where noted in HPI.   Physical Exam:  BP (!) 147/84   Pulse (!) 112   Temp 98.3 F (36.8 C) (Oral)   Ht 5\' 1"  (1.549 m)   Wt 204 lb 6.4 oz (92.7 kg)   LMP 10/02/2022 (Approximate)   BMI 38.62 kg/m  Patient's last menstrual period was 10/02/2022 (approximate). Psych:  Alert and cooperative. Normal mood and affect. General:   Alert,  Well-developed, well-nourished, pleasant and cooperative in NAD Head:  Normocephalic and atraumatic. Eyes:  Sclera clear, no icterus.   Conjunctiva pink. Abdomen:  Normal bowel sounds.  No bruits.  Soft, non-tender and non-distended without masses, hepatosplenomegaly or hernias noted.  No guarding or rebound tenderness.    Neurologic:  Alert and oriented x3;  grossly normal neurologically. Psych:  Alert and cooperative. Normal mood and affect.  Imaging Studies: CT ABDOMEN PELVIS W CONTRAST  Result Date: 10/07/2022 CLINICAL DATA:  Abdominal pain EXAM: CT ABDOMEN AND PELVIS WITH CONTRAST TECHNIQUE: Multidetector CT imaging of the abdomen and pelvis was performed using the standard protocol following bolus administration of intravenous contrast. RADIATION DOSE REDUCTION: This exam was performed according to the departmental dose-optimization program which includes automated exposure control, adjustment of the mA and/or kV according to patient size and/or use of iterative reconstruction technique. CONTRAST:  OMNIPAQUE IOHEXOL 300 MG/ML  SOLN COMPARISON:  CT abdomen and pelvis dated March 06, 2021 FINDINGS: Lower chest: No acute abnormality. Hepatobiliary: No focal liver abnormality is seen. No gallstones, gallbladder wall thickening, or biliary dilatation. Pancreas: Unremarkable. No pancreatic ductal dilatation or surrounding inflammatory changes. Spleen: Normal in size  without focal abnormality. Adrenals/Urinary Tract: Bilateral adrenal glands are unremarkable. No hydronephrosis or nephrolithiasis. Bladder is unremarkable. Stomach/Bowel: Stomach is within normal limits. Appendix appears normal. No evidence of bowel wall thickening, distention, or inflammatory changes. Vascular/Lymphatic: No significant vascular findings are present. No enlarged abdominal or pelvic lymph nodes. Reproductive: Heterogeneous and enlarged uterus, unchanged when compared with the prior exam. No adnexal mass. Other: No abdominal wall hernia or abnormality. No abdominopelvic ascites. Musculoskeletal: No acute or significant osseous findings. IMPRESSION: 1. No acute findings or pelvis. 2. Heterogeneous and enlarged uterus, unchanged when compared with the prior exam. Electronically Signed   By: Aliene Altes.D.  On: 10/07/2022 17:19    Assessment and Plan:   [name] is a 47 y.o. y/o female has been referred for abdominal discomfort her symptoms are suggestive of reflux/dyspepsia.  H. pylori negative not had a clear trial of PPI as pantoprazole caused her to get back pain.  Plan 1.  Trial of Nexium for 3 months if no better will need to proceed with upper endoscopy.  If better will need to gradually reduce the dose of PPI due to long-term side effects concerns 2.  Have discussed about lifestyle changes for acid reflux we will give her a patient information  Follow up in 3 to 4 months with APP or myself  Dr Wyline MoodKiran Tatem Fesler MD,MRCP(U.K)

## 2022-10-15 NOTE — Patient Instructions (Signed)

## 2022-11-10 ENCOUNTER — Ambulatory Visit: Payer: Commercial Managed Care - PPO | Admitting: Gastroenterology

## 2022-12-07 ENCOUNTER — Other Ambulatory Visit: Payer: Self-pay | Admitting: Family Medicine

## 2022-12-07 DIAGNOSIS — K219 Gastro-esophageal reflux disease without esophagitis: Secondary | ICD-10-CM

## 2022-12-07 DIAGNOSIS — R1013 Epigastric pain: Secondary | ICD-10-CM

## 2022-12-07 DIAGNOSIS — R110 Nausea: Secondary | ICD-10-CM

## 2022-12-19 NOTE — Progress Notes (Deleted)
Name: [name]   MRN: [id number]    DOB: 04-May-1976   Date:12/19/2022       Progress Note  Subjective  Chief Complaint  Neck Pain  HPI  *** Patient Active Problem List   Diagnosis Date Noted   Polyp of ascending colon    OSA (obstructive sleep apnea) 11/17/2017   Hypersomnia with sleep apnea 11/17/2017   Chronic tension-type headache, not intractable 05/20/2017   Essential hypertension 05/20/2017   Intermittent low back pain 04/21/2017   GERD (gastroesophageal reflux disease) 03/12/2016   Goiter 03/12/2016   MVP (mitral valve prolapse) 02/15/2015   History of iron deficiency anemia 02/15/2015   Vitamin D deficiency 01/11/2015   Vitamin B12 deficiency 01/11/2015   Pelvic pain 10/26/2012   Heart murmur 12/29/2011   Fibroid uterus 12/29/2011   Family history of breast cancer 12/29/2011    Past Surgical History:  Procedure Laterality Date   BREAST BIOPSY Left 11/17/2019   BENIGN BREAST TISSUE WITH PSEUDOANGIOMATOUS X Clip   COLONOSCOPY WITH PROPOFOL N/A 03/01/2021   Procedure: COLONOSCOPY WITH PROPOFOL;  Surgeon: Pasty Spillers, MD;  Location: [facility] ENDOSCOPY;  Service: Endoscopy;  Laterality: N/A;   LEEP     WISDOM TOOTH EXTRACTION     x4    Family History  Problem Relation Age of Onset   Cancer Mother 21       deceased at age 86 (breast)   Diabetes Mother    Hypertension Mother    Breast cancer Mother 69   Cancer Maternal Aunt 92       breast   Breast cancer Maternal Aunt 31   Cancer Maternal Grandmother 52       pancreatic   Cancer Maternal Grandfather 67       throat   Hypertension Father    Cancer Father 39       prostate   Hypertension Brother    Heart disease Paternal Grandmother        heart attack   Heart disease Paternal Grandfather        heart attack.    Social History   Tobacco Use   Smoking status: Never   Smokeless tobacco: Never  Substance Use Topics   Alcohol use: Yes    Comment: occasionally     Current  Outpatient Medications:    Cholecalciferol (VITAMIN D) 2000 units CAPS, Take by mouth., Disp: , Rfl:    esomeprazole (NEXIUM) 40 MG capsule, Take 1 capsule (40 mg total) by mouth daily at 12 noon., Disp: 90 capsule, Rfl: 3   famotidine (PEPCID) 40 MG tablet, TAKE 1 TABLET BY MOUTH EVERY DAY, Disp: 90 tablet, Rfl: 0   fluticasone (FLONASE) 50 MCG/ACT nasal spray, Place 1 spray into both nostrils as needed., Disp: 48 g, Rfl: 1   levothyroxine (SYNTHROID) 50 MCG tablet, Take 1 tablet by mouth daily at 12 noon., Disp: , Rfl:    metoprolol succinate (TOPROL-XL) 25 MG 24 hr tablet, TAKE 1 TABLET (25 MG TOTAL) BY MOUTH DAILY., Disp: 90 tablet, Rfl: 1   ondansetron (ZOFRAN-ODT) 4 MG disintegrating tablet, Take 1 tablet (4 mg total) by mouth every 8 (eight) hours as needed for nausea or vomiting., Disp: 20 tablet, Rfl: 0   sucralfate (CARAFATE) 1 g tablet, Take 1 tablet (1 g total) by mouth 4 (four) times daily., Disp: 60 tablet, Rfl: 0  Not on File  I personally reviewed active problem list, medication list, allergies, family history, social history, health maintenance with the patient/caregiver  today.   ROS  ***  Objective  There were no vitals filed for this visit.  There is no height or weight on file to calculate BMI.  Physical Exam ***   PHQ2/9:    10/03/2022    3:12 PM 05/28/2022   10:04 AM 02/12/2022   11:04 AM 08/14/2021   11:14 AM 09/17/2020    2:07 PM  Depression screen PHQ 2/9  Decreased Interest 0 0 0 0 0  Down, Depressed, Hopeless 0 0 0 0 0  PHQ - 2 Score 0 0 0 0 0  Altered sleeping 0 0 0 0   Tired, decreased energy 0 3 0 0   Change in appetite 0 0 0 0   Feeling bad or failure about yourself  0 0 0 0   Trouble concentrating 0 0 0 0   Moving slowly or fidgety/restless 0 0 0 0   Suicidal thoughts 0 0 0 0   PHQ-9 Score 0 3 0 0     phq 9 is {gen pos [id number]}   Fall Risk:    10/03/2022    3:12 PM 05/28/2022   10:04 AM 02/12/2022   11:04 AM 08/14/2021   11:14 AM  09/17/2020    2:07 PM  Fall Risk   Falls in the past year? 0 0 0 0 0  Number falls in past yr:  0 0 0 0  Injury with Fall?  0 0 0 0  Risk for fall due to : No Fall Risks No Fall Risks No Fall Risks No Fall Risks   Follow up Falls prevention discussed Falls prevention discussed Falls prevention discussed Falls prevention discussed       Functional Status Survey:      Assessment & Plan  *** There are no diagnoses linked to this encounter.

## 2022-12-22 ENCOUNTER — Ambulatory Visit: Payer: Commercial Managed Care - PPO | Admitting: Family Medicine

## 2023-01-19 ENCOUNTER — Ambulatory Visit: Payer: Commercial Managed Care - PPO | Admitting: Gastroenterology

## 2023-03-12 ENCOUNTER — Encounter: Payer: Self-pay | Admitting: Gastroenterology

## 2023-03-12 ENCOUNTER — Ambulatory Visit: Payer: Commercial Managed Care - PPO | Admitting: Gastroenterology

## 2023-03-12 DIAGNOSIS — R1013 Epigastric pain: Secondary | ICD-10-CM | POA: Diagnosis not present

## 2023-03-12 DIAGNOSIS — K219 Gastro-esophageal reflux disease without esophagitis: Secondary | ICD-10-CM

## 2023-03-12 DIAGNOSIS — R140 Abdominal distension (gaseous): Secondary | ICD-10-CM | POA: Diagnosis not present

## 2023-03-12 DIAGNOSIS — R110 Nausea: Secondary | ICD-10-CM

## 2023-03-12 MED ORDER — FAMOTIDINE 20 MG PO TABS: 20.0000 mg | ORAL_TABLET | Freq: Every day | ORAL | 3 refills | Status: DC

## 2023-03-12 NOTE — Progress Notes (Signed)
Wyline Mood MD, MRCP(U.K) [address]  [address]  [place], [place] [address]  Main: [phone]  Fax: [phone]   Primary Care Physician: Alba Cory, MD  Primary Gastroenterologist:  Dr. Wyline Mood   Chief Complaint  Patient presents with   Gastroesophageal Reflux    HPI: [name] is a 47 y.o. female  Summary of history : Seen back in April 2024 for epigastric pain.  Also had a degree of constipation. She was seen in the emergency room on 10/07/2022 with upper abdominal pain going on for 3 weeks duration.  CT scan of the abdomen showed no acute abnormalities.  Hemoglobin and CMP, lipase were normal.  H. pylori breath test in January 2024 was negative.  She was prescribed Carafate and Protonix 40 mg od and asked to follow-up with GI.  She has had a colonoscopy in 2022 by Dr. Maximino Greenland.  Showed a 3 mm polyp in the ascending colon otherwise no other abnormalities.  The polyp was a tubular adenoma.   Interval history   10/15/2022-03/12/2023 After her last visit she was supposed to start a PPI but did not do so as she was worried about getting back pain.  Was commenced on famotidine 40 mg a day which she has been taking and has significantly helped her with her abdominal discomfort but she also still gets occasional left upper quadrant discomfort once or twice a week lasting for a few minutes.  I did notice that she was chewing gum which she says she chews quite a bit.   Current Outpatient Medications  Medication Sig Dispense Refill   Cholecalciferol (VITAMIN D) 2000 units CAPS Take by mouth.     famotidine (PEPCID) 20 MG tablet Take 1 tablet (20 mg total) by mouth daily. 90 tablet 3   fluticasone (FLONASE) 50 MCG/ACT nasal spray Place 1 spray into both nostrils as needed. 48 g 1   levothyroxine (SYNTHROID) 75 MCG tablet Take 75 mcg by mouth daily before breakfast.     metoprolol succinate (TOPROL-XL) 25 MG 24 hr tablet TAKE 1 TABLET (25 MG TOTAL) BY MOUTH DAILY.  90 tablet 1   ondansetron (ZOFRAN-ODT) 4 MG disintegrating tablet Take 1 tablet (4 mg total) by mouth every 8 (eight) hours as needed for nausea or vomiting. 20 tablet 0   sucralfate (CARAFATE) 1 g tablet Take 1 tablet (1 g total) by mouth 4 (four) times daily. 60 tablet 0   No current facility-administered medications for this visit.    Allergies as of 03/12/2023   (No Known Allergies)     ROS:  General: Negative for anorexia, weight loss, fever, chills, fatigue, weakness. ENT: Negative for hoarseness, difficulty swallowing , nasal congestion. CV: Negative for chest pain, angina, palpitations, dyspnea on exertion, peripheral edema.  Respiratory: Negative for dyspnea at rest, dyspnea on exertion, cough, sputum, wheezing.  GI: See history of present illness. GU:  Negative for dysuria, hematuria, urinary incontinence, urinary frequency, nocturnal urination.  Endo: Negative for unusual weight change.    Physical Examination:   BP (!) 156/86   Pulse (!) 111   Temp 98.3 F (36.8 C) (Oral)   Ht 5\' 1"  (1.549 m)   Wt 208 lb (94.3 kg)   BMI 39.30 kg/m   General: Well-nourished, well-developed in no acute distress.  Eyes: No icterus. Conjunctivae pink. Neuro: Alert and oriented x 3.  Grossly intact. Skin: Warm and dry, no jaundice.   Psych: Alert and cooperative, normal mood and affect.  Imaging Studies: No results found.  Assessment and Plan:   [name] is a 47 y.o. y/o female here to follow-up for dyspepsia H. pylori negative.  Very likely her episodes of abdominal discomfort is related to aerophagia and bloating related to artificial sugars present and chewing gum which she is consuming to a fair degree.  She has had significant improvement with Pepcid.  Plan 1.  Reduce dose of Pepcid to 20 mg a day 2.  Stop chewing gum artificial sugars and sweeteners if better can try to wean off Pepcid if not better next up would be an EGD    Dr Wyline Mood  MD,MRCP  [facility]) Follow up in 3 to 4 months

## 2023-04-29 ENCOUNTER — Ambulatory Visit
Admission: RE | Admit: 2023-04-29 | Discharge: 2023-04-29 | Disposition: A | Discharge status: Home / Self Care | Payer: Commercial Managed Care - PPO | Source: Ambulatory Visit | Attending: Family Medicine | Admitting: Family Medicine

## 2023-04-29 DIAGNOSIS — Z1231 Encounter for screening mammogram for malignant neoplasm of breast: Secondary | ICD-10-CM | POA: Diagnosis present

## 2023-05-29 ENCOUNTER — Other Ambulatory Visit: Payer: Self-pay | Admitting: Family Medicine

## 2023-05-29 DIAGNOSIS — I10 Essential (primary) hypertension: Secondary | ICD-10-CM

## 2023-05-29 DIAGNOSIS — G44229 Chronic tension-type headache, not intractable: Secondary | ICD-10-CM

## 2023-08-23 ENCOUNTER — Other Ambulatory Visit: Payer: Self-pay | Admitting: Family Medicine

## 2023-08-23 DIAGNOSIS — I10 Essential (primary) hypertension: Secondary | ICD-10-CM

## 2023-08-23 DIAGNOSIS — G44229 Chronic tension-type headache, not intractable: Secondary | ICD-10-CM

## 2023-08-25 ENCOUNTER — Encounter: Payer: Self-pay | Admitting: Family Medicine

## 2023-08-25 ENCOUNTER — Telehealth: Payer: Self-pay

## 2023-08-25 ENCOUNTER — Ambulatory Visit: Payer: Commercial Managed Care - PPO | Admitting: Family Medicine

## 2023-08-25 VITALS — BP 134/78 | HR 97 | Temp 98.1°F | Resp 16 | Ht 61.0 in | Wt 206.9 lb

## 2023-08-25 DIAGNOSIS — I10 Essential (primary) hypertension: Secondary | ICD-10-CM

## 2023-08-25 DIAGNOSIS — G4733 Obstructive sleep apnea (adult) (pediatric): Secondary | ICD-10-CM

## 2023-08-25 DIAGNOSIS — K219 Gastro-esophageal reflux disease without esophagitis: Secondary | ICD-10-CM | POA: Diagnosis not present

## 2023-08-25 DIAGNOSIS — E559 Vitamin D deficiency, unspecified: Secondary | ICD-10-CM

## 2023-08-25 DIAGNOSIS — E538 Deficiency of other specified B group vitamins: Secondary | ICD-10-CM

## 2023-08-25 DIAGNOSIS — E785 Hyperlipidemia, unspecified: Secondary | ICD-10-CM

## 2023-08-25 DIAGNOSIS — E039 Hypothyroidism, unspecified: Secondary | ICD-10-CM

## 2023-08-25 DIAGNOSIS — G44229 Chronic tension-type headache, not intractable: Secondary | ICD-10-CM

## 2023-08-25 MED ORDER — HYDROCHLOROTHIAZIDE 12.5 MG PO TABS
12.5000 mg | ORAL_TABLET | Freq: Every day | ORAL | 0 refills | Status: DC
Start: 2023-08-25 — End: 2023-08-25

## 2023-08-25 MED ORDER — HYDROCHLOROTHIAZIDE 12.5 MG PO TABS
12.5000 mg | ORAL_TABLET | Freq: Every day | ORAL | 0 refills | Status: DC
Start: 2023-08-25 — End: 2023-11-16

## 2023-08-25 MED ORDER — BACLOFEN 10 MG PO TABS
10.0000 mg | ORAL_TABLET | Freq: Three times a day (TID) | ORAL | 0 refills | Status: DC | PRN
Start: 2023-08-25 — End: 2024-05-18

## 2023-08-25 NOTE — Telephone Encounter (Signed)
 refilled

## 2023-08-25 NOTE — Progress Notes (Signed)
 Name: [name]   MRN: [id number]    DOB: 26-May-1976   Date:08/25/2023       Progress Note  Subjective  Chief Complaint  Chief Complaint  Patient presents with   Medical Management of Chronic Issues   Discussed the use of AI scribe software for clinical note transcription with the patient, who gave verbal consent to proceed.  History of Present Illness   [name] is a 48 year old female with gastroesophageal reflux disease and hypertension who presents for a regular follow-up visit.  She has a history of gastroesophageal reflux disease (GERD) and was previously treated with omeprazole and Nexium. Despite dietary modifications, including reducing gum, coffee, caffeine, and spicy foods, she experiences ongoing symptoms of acid reflux. Currently, she is taking famotidine 40 mg  as she attempts to wean off prescription medications.  She also has a history of H. pylori infection, which was treated, and a subsequent test in 2023 was negative. In April 2024, she experienced severe gastric pain and nausea, leading to an emergency room visit where a CT scan was performed. Since than she has seen GI twice   She reports fluctuating blood pressure readings at home, with values ranging from 120s to 150s systolic. She is currently taking metoprolol for hypertension but notes that her blood pressure has been inconsistent. No specific triggers for the fluctuations are identified, and she typically rechecks her blood pressure later in the day if it is high. No chest pain, palpitations, or feeling like her heart is skipping a beat.    She experiences chronic tension-type headaches, occurring daily for the past week, and attributes some of the pain to ear pressure and nasal congestion. She uses fluticasone for allergies and takes allergy pills for symptom relief. She has not tried any specific headache medications recently.  She has a history of obstructive sleep apnea and uses a CPAP machine  nightly. She reports improved daytime sleepiness after resuming vitamin D supplementation, which she had previously stopped.  She also has a history of hypothyroidism, managed with levothyroxine 75 mcg, and sees an endocrinologist for this condition but no labs since Summer 2024       Patient Active Problem List   Diagnosis Date Noted   Polyp of ascending colon    OSA (obstructive sleep apnea) 11/17/2017   Hypersomnia with sleep apnea 11/17/2017   Chronic tension-type headache, not intractable 05/20/2017   Essential hypertension 05/20/2017   Intermittent low back pain 04/21/2017   GERD (gastroesophageal reflux disease) 03/12/2016   Goiter 03/12/2016   MVP (mitral valve prolapse) 02/15/2015   History of iron deficiency anemia 02/15/2015   Vitamin D deficiency 01/11/2015   Vitamin B12 deficiency 01/11/2015   Pelvic pain 10/26/2012   Heart murmur 12/29/2011   Fibroid uterus 12/29/2011   Family history of breast cancer 12/29/2011    Past Surgical History:  Procedure Laterality Date   BREAST BIOPSY Left 11/17/2019   BENIGN BREAST TISSUE WITH PSEUDOANGIOMATOUS X Clip   COLONOSCOPY WITH PROPOFOL N/A 03/01/2021   Procedure: COLONOSCOPY WITH PROPOFOL;  Surgeon: Pasty Spillers, MD;  Location: [facility] ENDOSCOPY;  Service: Endoscopy;  Laterality: N/A;   LEEP     WISDOM TOOTH EXTRACTION     x4    Family History  Problem Relation Age of Onset   Cancer Mother 28       deceased at age 22 (breast)   Diabetes Mother    Hypertension Mother    Breast cancer Mother 28  Cancer Maternal Aunt 32       breast   Breast cancer Maternal Aunt 31   Cancer Maternal Grandmother 70       pancreatic   Cancer Maternal Grandfather 67       throat   Hypertension Father    Cancer Father 60       prostate   Hypertension Brother    Heart disease Paternal Grandmother        heart attack   Heart disease Paternal Grandfather        heart attack.    Social History   Tobacco Use   Smoking  status: Never   Smokeless tobacco: Never  Substance Use Topics   Alcohol use: Yes    Comment: occasionally     Current Outpatient Medications:    Cholecalciferol (VITAMIN D) 2000 units CAPS, Take by mouth., Disp: , Rfl:    fluticasone (FLONASE) 50 MCG/ACT nasal spray, Place 1 spray into both nostrils as needed., Disp: 48 g, Rfl: 1   levothyroxine (SYNTHROID) 75 MCG tablet, Take 75 mcg by mouth daily before breakfast., Disp: , Rfl:    metoprolol succinate (TOPROL-XL) 25 MG 24 hr tablet, TAKE 1 TABLET (25 MG TOTAL) BY MOUTH DAILY., Disp: 90 tablet, Rfl: 0   sucralfate (CARAFATE) 1 g tablet, Take 1 tablet (1 g total) by mouth 4 (four) times daily., Disp: 60 tablet, Rfl: 0   famotidine (PEPCID) 20 MG tablet, Take 1 tablet (20 mg total) by mouth daily. (Patient not taking: Reported on 08/25/2023), Disp: 90 tablet, Rfl: 3   ondansetron (ZOFRAN-ODT) 4 MG disintegrating tablet, Take 1 tablet (4 mg total) by mouth every 8 (eight) hours as needed for nausea or vomiting. (Patient not taking: Reported on 08/25/2023), Disp: 20 tablet, Rfl: 0  No Known Allergies  I personally reviewed active problem list, medication list, allergies, family history with the patient/caregiver today.   ROS  Ten systems reviewed and is negative except as mentioned in HPI    Objective  Vitals:   08/25/23 1442  BP: 134/78  Pulse: 97  Resp: 16  Temp: 98.1 F (36.7 C)  TempSrc: Oral  SpO2: 98%  Weight: 206 lb 14.4 oz (93.8 kg)  Height: 5\' 1"  (1.549 m)    Body mass index is 39.09 kg/m.  Physical Exam  Constitutional: Patient appears well-developed and well-nourished. Obese  No distress.  HEENT: head atraumatic, normocephalic, pupils equal and reactive to light, neck supple Cardiovascular: Normal rate, regular rhythm and normal heart sounds.  No murmur heard. No BLE edema. Pulmonary/Chest: Effort normal and breath sounds normal. No respiratory distress. Abdominal: Soft.  There is no tenderness. Psychiatric:  Patient has a normal mood and affect. behavior is normal. Judgment and thought content normal.   Diabetic Foot Exam:     PHQ2/9:    08/25/2023    2:40 PM 10/03/2022    3:12 PM 05/28/2022   10:04 AM 02/12/2022   11:04 AM 08/14/2021   11:14 AM  Depression screen PHQ 2/9  Decreased Interest 0 0 0 0 0  Down, Depressed, Hopeless 0 0 0 0 0  PHQ - 2 Score 0 0 0 0 0  Altered sleeping 0 0 0 0 0  Tired, decreased energy 0 0 3 0 0  Change in appetite 0 0 0 0 0  Feeling bad or failure about yourself  0 0 0 0 0  Trouble concentrating 0 0 0 0 0  Moving slowly or fidgety/restless 0 0 0 0 0  Suicidal thoughts 0 0 0 0 0  PHQ-9 Score 0 0 3 0 0  Difficult doing work/chores Not difficult at all        phq 9 is negative  Fall Risk:    08/25/2023    2:40 PM 10/03/2022    3:12 PM 05/28/2022   10:04 AM 02/12/2022   11:04 AM 08/14/2021   11:14 AM  Fall Risk   Falls in the past year? 0 0 0 0 0  Number falls in past yr: 0  0 0 0  Injury with Fall? 0  0 0 0  Risk for fall due to : No Fall Risks No Fall Risks No Fall Risks No Fall Risks No Fall Risks  Follow up Falls prevention discussed;Education provided;Falls evaluation completed Falls prevention discussed Falls prevention discussed Falls prevention discussed Falls prevention discussed     Assessment & Plan     Gastroesophageal Reflux Disease (GERD) Persistent symptoms despite lifestyle modifications and Famotidine use. Patient is under the care of a gastroenterologist (Dr. Tobi Bastos). -Continue Famotidine as needed. -Follow up with Dr. Tobi Bastos for further management.  Hypertension Blood pressure readings at home are inconsistent and occasionally elevated, despite Metoprolol use. -Add Hydrochlorothiazide 12.5mg  daily to current regimen. -Check blood pressure at home and follow up in 1 month.  Tension Headaches Daily headaches for the past week, possibly related to tension or stress. -Start Baclofen as needed for muscle relaxation. -Encourage  non-pharmacological interventions such as cold compresses, adequate sleep, neck massages, and topical analgesics.  Hypothyroidism On Levothyroxine , last TSH checked in June. -Check TSH level to ensure adequate control.  Vitamin D and B12 Deficiency Previously diagnosed, patient recently restarted Vitamin D supplementation due to excessive daytime sleepiness. -Check Vitamin D and B12 levels.  Dyslipidemia History of elevated cholesterol and triglycerides. -Check lipid panel.  General Health Maintenance -Continue CPAP use for obstructive sleep apnea. -Continue Fluticasone as needed for allergy symptoms. -Schedule physical exam. -Check CBC, liver function tests, and glucose level.

## 2023-09-23 ENCOUNTER — Ambulatory Visit: Payer: Commercial Managed Care - PPO | Admitting: Family Medicine

## 2023-09-27 ENCOUNTER — Other Ambulatory Visit: Payer: Self-pay | Admitting: Family Medicine

## 2023-09-27 DIAGNOSIS — I10 Essential (primary) hypertension: Secondary | ICD-10-CM

## 2023-10-07 ENCOUNTER — Ambulatory Visit: Admitting: Family Medicine

## 2023-10-08 ENCOUNTER — Ambulatory Visit: Admitting: Family Medicine

## 2023-10-26 ENCOUNTER — Encounter: Payer: Self-pay | Admitting: Family Medicine

## 2023-11-14 ENCOUNTER — Other Ambulatory Visit: Payer: Self-pay | Admitting: Family Medicine

## 2023-11-14 DIAGNOSIS — I10 Essential (primary) hypertension: Secondary | ICD-10-CM

## 2023-11-16 ENCOUNTER — Encounter: Payer: Self-pay | Admitting: Family Medicine

## 2023-11-16 ENCOUNTER — Ambulatory Visit: Payer: Commercial Managed Care - PPO | Admitting: Family Medicine

## 2023-11-16 VITALS — BP 122/74 | HR 98 | Resp 16 | Ht 61.0 in | Wt 204.5 lb

## 2023-11-16 DIAGNOSIS — I10 Essential (primary) hypertension: Secondary | ICD-10-CM

## 2023-11-16 DIAGNOSIS — G4733 Obstructive sleep apnea (adult) (pediatric): Secondary | ICD-10-CM

## 2023-11-16 DIAGNOSIS — Z1231 Encounter for screening mammogram for malignant neoplasm of breast: Secondary | ICD-10-CM

## 2023-11-16 DIAGNOSIS — Z0000 Encounter for general adult medical examination without abnormal findings: Secondary | ICD-10-CM

## 2023-11-16 DIAGNOSIS — Z0001 Encounter for general adult medical examination with abnormal findings: Secondary | ICD-10-CM

## 2023-11-16 DIAGNOSIS — G44229 Chronic tension-type headache, not intractable: Secondary | ICD-10-CM | POA: Diagnosis not present

## 2023-11-16 DIAGNOSIS — M546 Pain in thoracic spine: Secondary | ICD-10-CM | POA: Diagnosis not present

## 2023-11-16 MED ORDER — CELECOXIB 100 MG PO CAPS
100.0000 mg | ORAL_CAPSULE | Freq: Two times a day (BID) | ORAL | 0 refills | Status: DC
Start: 2023-11-16 — End: 2023-12-14

## 2023-11-16 MED ORDER — HYDROCHLOROTHIAZIDE 12.5 MG PO TABS
12.5000 mg | ORAL_TABLET | Freq: Every day | ORAL | 1 refills | Status: DC
Start: 2023-11-16 — End: 2024-05-18

## 2023-11-16 MED ORDER — METOPROLOL SUCCINATE ER 25 MG PO TB24: 25.0000 mg | ORAL_TABLET | Freq: Every day | ORAL | 1 refills | Status: DC

## 2023-11-16 NOTE — Progress Notes (Signed)
 Name: [name]   MRN: [id number]    DOB: 1976-02-25   Date:11/16/2023       Progress Note  Subjective  Chief Complaint  Chief Complaint  Patient presents with   Annual Exam    HPI  Patient presents for annual CPE.  Discussed the use of AI scribe software for clinical note transcription with the patient, who gave verbal consent to proceed.  History of Present Illness [name] is a 48 year old female who presents with right-sided flank pain for one week.  She has been experiencing sharp, intermittent right-sided flank pain for approximately one week, located at the bottom of her ribcage. The pain occurs regardless of her activity level, including when lying down, sitting, or at work. No burning sensation during urination, nausea, vomiting, or rashes in the area. She has not tried any medications, heat, or cold treatments for the pain.  She has a history of tension headaches, for which she was prescribed baclofen , resulting in improvement. She is currently taking metoprolol  25 mg and HCTZ 12.5 mg once daily for hypertension and uses a CPAP machine regularly for sleep apnea.  Her menstrual cycles are regular, with heavy flow on the second and third days, requiring pad changes every three hours, and lasting six days. She experiences occasional stress incontinence, particularly when coughing or sneezing, and reports occasional vaginal dryness during intercourse, especially when not in the mood or tired.  Her family history includes breast cancer, but genetic testing was negative. She is taking vitamin D  2000 units daily.  She engages in physical activity by walking on a treadmill at least twice a week for about 34 minutes and follows a keto-type diet to reduce carbohydrate and sugar intake.     Diet: cutting down on carbohydrates  Exercise:  walks on treadmill at least 2 times a week for 35 minutes  Last Eye Exam: completed Last Dental Exam: completed  Flowsheet  Row Office Visit from 11/16/2023 in [facility]  AUDIT-C Score 1      Depression: Phq 9 is  negative    11/16/2023    2:30 PM 08/25/2023    2:40 PM 10/03/2022    3:12 PM 05/28/2022   10:04 AM 02/12/2022   11:04 AM  Depression screen PHQ 2/9  Decreased Interest 0 0 0 0 0  Down, Depressed, Hopeless 0 0 0 0 0  PHQ - 2 Score 0 0 0 0 0  Altered sleeping  0 0 0 0  Tired, decreased energy  0 0 3 0  Change in appetite  0 0 0 0  Feeling bad or failure about yourself   0 0 0 0  Trouble concentrating  0 0 0 0  Moving slowly or fidgety/restless  0 0 0 0  Suicidal thoughts  0 0 0 0  PHQ-9 Score  0 0 3 0  Difficult doing work/chores  Not difficult at all      Hypertension: BP Readings from Last 3 Encounters:  11/16/23 122/74  08/25/23 134/78  03/12/23 (!) 156/86   Obesity: Wt Readings from Last 3 Encounters:  11/16/23 204 lb 8 oz (92.8 kg)  08/25/23 206 lb 14.4 oz (93.8 kg)  03/12/23 208 lb (94.3 kg)   BMI Readings from Last 3 Encounters:  11/16/23 38.64 kg/m  08/25/23 39.09 kg/m  03/12/23 39.30 kg/m     Vaccines: reviewed with the patient.   Hep C Screening: completed STD testing and prevention (HIV/chl/gon/syphilis): N/A Intimate  partner violence: negative screen  Sexual History : one partner, sometimes vaginal dryness  Menstrual History/LMP/Abnormal Bleeding: regular cycles , heavy for two days, lasts 6 days  Discussed importance of follow up if any post-menopausal bleeding: not applicable  Incontinence Symptoms: occasionally has stress incontinence   Breast cancer:  - Last Mammogram: up to date - BRCA gene screening: family history of breast cancer, had genetic test done by gyn and negative   Osteoporosis Prevention : Discussed high calcium and vitamin D  supplementation, weight bearing exercises Bone density :not applicable   Cervical cancer screening: up-to-date  Skin cancer: Discussed monitoring for atypical lesions  Colorectal cancer:  repeat 2029   Lung cancer:  Low Dose CT Chest recommended if Age 22-80 years, 20 pack-year currently smoking OR have quit w/in 15years. Patient does not qualify for screen   ECG: 2018  Advanced Care Planning: A voluntary discussion about advance care planning including the explanation and discussion of advance directives.  Discussed health care proxy and Living will, and the patient was able to identify a health care proxy as husband.  Patient does not have a living will and power of attorney of health care   Patient Active Problem List   Diagnosis Date Noted   Polyp of ascending colon    OSA (obstructive sleep apnea) 11/17/2017   Hypersomnia with sleep apnea 11/17/2017   Chronic tension-type headache, not intractable 05/20/2017   Essential hypertension 05/20/2017   Intermittent low back pain 04/21/2017   GERD (gastroesophageal reflux disease) 03/12/2016   Goiter 03/12/2016   MVP (mitral valve prolapse) 02/15/2015   History of iron deficiency anemia 02/15/2015   Vitamin D  deficiency 01/11/2015   Vitamin B12 deficiency 01/11/2015   Pelvic pain 10/26/2012   Heart murmur 12/29/2011   Fibroid uterus 12/29/2011   Family history of breast cancer 12/29/2011    Past Surgical History:  Procedure Laterality Date   BREAST BIOPSY Left 11/17/2019   BENIGN BREAST TISSUE WITH PSEUDOANGIOMATOUS X Clip   COLONOSCOPY WITH PROPOFOL  N/A 03/01/2021   Procedure: COLONOSCOPY WITH PROPOFOL ;  Surgeon: Irby Mannan, MD;  Location: [facility] ENDOSCOPY;  Service: Endoscopy;  Laterality: N/A;   LEEP     WISDOM TOOTH EXTRACTION     x4    Family History  Problem Relation Age of Onset   Cancer Mother 6       deceased at age 23 (breast)   Diabetes Mother    Hypertension Mother    Breast cancer Mother 91   Cancer Maternal Aunt 58       breast   Breast cancer Maternal Aunt 31   Cancer Maternal Grandmother 54       pancreatic   Cancer Maternal Grandfather 67       throat   Hypertension Father     Cancer Father 5       prostate   Hypertension Brother    Heart disease Paternal Grandmother        heart attack   Heart disease Paternal Grandfather        heart attack.    Social History   Socioeconomic History   Marital status: Married    Spouse name: Erick   Number of children: 5   Years of education: Not on file   Highest education level: Not on file  Occupational History   Occupation: billing   Tobacco Use   Smoking status: Never   Smokeless tobacco: Never  Vaping Use   Vaping status: Never Used  Substance and  Sexual Activity   Alcohol use: Yes    Comment: occasionally   Drug use: No   Sexual activity: Yes    Partners: Male    Birth control/protection: Surgical    Comment: vasectomy  Other Topics Concern   Not on file  Social History Narrative   Married, 5 children, one is grown and out of the house.    Youngest born 2008.   Works full time at [employer] to school for health care information - online, graduates in 2019   Social Drivers of Health   Financial Resource Strain: Low Risk  (11/16/2023)   Overall Financial Resource Strain (CARDIA)    Difficulty of Paying Living Expenses: Not hard at all  Food Insecurity: No Food Insecurity (11/16/2023)   Hunger Vital Sign    Worried About Running Out of Food in the Last Year: Never true    Ran Out of Food in the Last Year: Never true  Transportation Needs: No Transportation Needs (11/16/2023)   PRAPARE - Administrator, Civil Service (Medical): No    Lack of Transportation (Non-Medical): No  Physical Activity: Insufficiently Active (11/16/2023)   Exercise Vital Sign    Days of Exercise per Week: 2 days    Minutes of Exercise per Session: 30 min  Stress: No Stress Concern Present (11/16/2023)   [employer] of Occupational Health - Occupational Stress Questionnaire    Feeling of Stress : Only a little  Social Connections: Moderately Isolated (11/16/2023)   Social Connection and Isolation Panel  [NHANES]    Frequency of Communication with Friends and Family: More than three times a week    Frequency of Social Gatherings with Friends and Family: More than three times a week    Attends Religious Services: Never    Database administrator or Organizations: No    Attends Banker Meetings: Never    Marital Status: Married  Catering manager Violence: Not At Risk (11/16/2023)   Humiliation, Afraid, Rape, and Kick questionnaire    Fear of Current or Ex-Partner: No    Emotionally Abused: No    Physically Abused: No    Sexually Abused: No     Current Outpatient Medications:    baclofen  (LIORESAL ) 10 MG tablet, Take 1 tablet (10 mg total) by mouth 3 (three) times daily as needed for muscle spasms., Disp: 30 each, Rfl: 0   Cholecalciferol (VITAMIN D ) 2000 units CAPS, Take by mouth., Disp: , Rfl:    famotidine  (PEPCID ) 20 MG tablet, Take 1 tablet (20 mg total) by mouth daily., Disp: 90 tablet, Rfl: 3   fluticasone  (FLONASE ) 50 MCG/ACT nasal spray, Place 1 spray into both nostrils as needed., Disp: 48 g, Rfl: 1   hydrochlorothiazide  (HYDRODIURIL ) 12.5 MG tablet, Take 1 tablet (12.5 mg total) by mouth daily., Disp: 30 tablet, Rfl: 0   levothyroxine (SYNTHROID) 75 MCG tablet, Take 75 mcg by mouth daily before breakfast., Disp: , Rfl:    metoprolol  succinate (TOPROL -XL) 25 MG 24 hr tablet, TAKE 1 TABLET (25 MG TOTAL) BY MOUTH DAILY., Disp: 90 tablet, Rfl: 0  No Known Allergies   ROS  Constitutional: Negative for fever or weight change.  Respiratory: Negative for cough and shortness of breath.   Cardiovascular: Negative for chest pain or palpitations.  Gastrointestinal: Negative for abdominal pain, no bowel changes.  Musculoskeletal: Negative for gait problem or joint swelling.  Skin: Negative for rash.  Neurological: Negative for dizziness or headache.  No other specific  complaints in a complete review of systems (except as listed in HPI above).   Objective  Vitals:    11/16/23 1432  BP: 122/74  Pulse: 98  Resp: 16  SpO2: 95%  Weight: 204 lb 8 oz (92.8 kg)  Height: 5\' 1"  (1.549 m)    Body mass index is 38.64 kg/m.  Physical Exam  Constitutional: Patient appears well-developed and well-nourished. No distress.  HENT: Head: Normocephalic and atraumatic. Ears: B TMs ok, no erythema or effusion; Nose: Nose normal. Mouth/Throat: Oropharynx is clear and moist. No oropharyngeal exudate.  Eyes: Conjunctivae and EOM are normal. Pupils are equal, round, and reactive to light. No scleral icterus.  Neck: Normal range of motion. Neck supple. No JVD present. No thyromegaly present.  Cardiovascular: Normal rate, regular rhythm and normal heart sounds.  No murmur heard. No BLE edema. Pulmonary/Chest: Effort normal and breath sounds normal. No respiratory distress. Abdominal: Soft. Bowel sounds are normal, no distension. There is no tenderness. no masses Breast: not done, sees gyn  FEMALE GENITALIA:  Not done - sees gyn  RECTAL: not done  Musculoskeletal: Normal range of motion, no joint effusions. No gross deformities. Tender during palpation of right mid to lower back Neurological: he is alert and oriented to person, place, and time. No cranial nerve deficit. Coordination, balance, strength, speech and gait are normal.  Skin: Skin is warm and dry. No rash noted. No erythema.  Psychiatric: Patient has a normal mood and affect. behavior is normal. Judgment and thought content normal.     Assessment & Plan   Assessment & Plan General Health Maintenance/Well adult exam Discussed vaccinations, screenings, lifestyle modifications, and sexual health. - Discussed annual flu vaccination. - Encouraged blood work today for thyroid  monitoring. - Reviewed mammogram schedule; next due October 2025. - Reviewed Pap smear schedule; next due 2026. - Encouraged vitamin D  supplementation. - Reviewed colorectal cancer screening schedule; next due 2029. - Encouraged  increased physical activity and reduced carbohydrate intake.  Acute right-sided back pain Likely musculoskeletal. No kidney involvement. - Prescribe Celebrex 200 mg, 1-2 tablets daily for 5 days. - Advise use of Icy Hot patches. - Use baclofen  as needed. - Follow up if unresolved after 5 days.  Hypertension Blood pressure controlled with metoprolol . Discussed HCTZ for additional control. Advised monitoring for hypotension symptoms. - Continue metoprolol  25 mg daily. - Prescribe HCTZ 12.5 mg daily for 90 days with one refill. - Monitor blood pressure at home and report low readings or dizziness.  Hypothyroidism Thyroid  function to be monitored. Follow-up with Dr. Esta Heidelberg. - Order thyroid  function tests today. - Follow up with Dr. Esta Heidelberg.  Tension headache Improved with baclofen . - Continue baclofen  as needed.  Obstructive sleep apnea Good compliance with CPAP.  Gastroesophageal reflux disease (GERD) Well-controlled.   Goals of Care Discussed advanced directives and living will. Husband designated for medical decisions. - Encourage discussion with husband about medical care wishes and advanced directives.     -USPSTF grade A and B recommendations reviewed with patient; age-appropriate recommendations, preventive care, screening tests, etc discussed and encouraged; healthy living encouraged; see AVS for patient education given to patient -Discussed importance of 150 minutes of physical activity weekly, eat two servings of fish weekly, eat one serving of tree nuts ( cashews, pistachios, pecans, almonds.Aaron Aas) every other day, eat 6 servings of fruit/vegetables daily and drink plenty of water and avoid sweet beverages.   -Reviewed Health Maintenance: Yes.

## 2023-11-19 ENCOUNTER — Other Ambulatory Visit: Payer: Self-pay

## 2023-11-19 ENCOUNTER — Ambulatory Visit: Payer: Self-pay | Admitting: Family Medicine

## 2023-11-19 DIAGNOSIS — D649 Anemia, unspecified: Secondary | ICD-10-CM

## 2023-11-19 LAB — COMPREHENSIVE METABOLIC PANEL WITH GFR
ALT: 13 IU/L (ref 0–32)
AST: 11 IU/L (ref 0–40)
Albumin: 4.2 g/dL (ref 3.9–4.9)
Alkaline Phosphatase: 84 IU/L (ref 44–121)
BUN/Creatinine Ratio: 18 (ref 9–23)
BUN: 14 mg/dL (ref 6–24)
Bilirubin Total: 0.5 mg/dL (ref 0.0–1.2)
CO2: 22 mmol/L (ref 20–29)
Calcium: 9.4 mg/dL (ref 8.7–10.2)
Chloride: 100 mmol/L (ref 96–106)
Creatinine, Ser: 0.8 mg/dL (ref 0.57–1.00)
Globulin, Total: 2.9 g/dL (ref 1.5–4.5)
Glucose: 95 mg/dL (ref 70–99)
Potassium: 3.8 mmol/L (ref 3.5–5.2)
Sodium: 138 mmol/L (ref 134–144)
Total Protein: 7.1 g/dL (ref 6.0–8.5)
eGFR: 90 mL/min/{1.73_m2} (ref >59)

## 2023-11-19 LAB — CBC WITH DIFFERENTIAL/PLATELET
Basophils Absolute: 0 10*3/uL (ref 0.0–0.2)
Basos: 0 %
EOS (ABSOLUTE): 0.1 10*3/uL (ref 0.0–0.4)
Eos: 2 %
Hematocrit: 35.6 % (ref 34.0–46.6)
Hemoglobin: 11.1 g/dL (ref 11.1–15.9)
Immature Grans (Abs): 0 10*3/uL (ref 0.0–0.1)
Immature Granulocytes: 1 %
Lymphocytes Absolute: 2.6 10*3/uL (ref 0.7–3.1)
Lymphs: 42 %
MCH: 25.3 pg — ABNORMAL LOW (ref 26.6–33.0)
MCHC: 31.2 g/dL — ABNORMAL LOW (ref 31.5–35.7)
MCV: 81 fL (ref 79–97)
Monocytes Absolute: 0.7 10*3/uL (ref 0.1–0.9)
Monocytes: 11 %
Neutrophils Absolute: 2.7 10*3/uL (ref 1.4–7.0)
Neutrophils: 44 %
Platelets: 347 10*3/uL (ref 150–450)
RBC: 4.39 x10E6/uL (ref 3.77–5.28)
RDW: 14.2 % (ref 11.7–15.4)
WBC: 6.1 10*3/uL (ref 3.4–10.8)

## 2023-11-19 LAB — LIPID PANEL
Chol/HDL Ratio: 4.3 ratio (ref 0.0–4.4)
Cholesterol, Total: 218 mg/dL — ABNORMAL HIGH (ref 100–199)
HDL: 51 mg/dL (ref >39)
LDL Chol Calc (NIH): 140 mg/dL — ABNORMAL HIGH (ref 0–99)
Triglycerides: 150 mg/dL — ABNORMAL HIGH (ref 0–149)
VLDL Cholesterol Cal: 27 mg/dL (ref 5–40)

## 2023-11-19 LAB — VITAMIN D 25 HYDROXY (VIT D DEFICIENCY, FRACTURES): Vit D, 25-Hydroxy: 28.8 ng/mL — ABNORMAL LOW (ref 30.0–100.0)

## 2023-11-19 LAB — B12 AND FOLATE PANEL
Folate: 8.6 ng/mL (ref >3.0)
Vitamin B-12: 622 pg/mL (ref 232–1245)

## 2023-11-19 LAB — TSH: TSH: 3.58 u[IU]/mL (ref 0.450–4.500)

## 2023-11-20 ENCOUNTER — Other Ambulatory Visit: Payer: Self-pay

## 2023-11-20 DIAGNOSIS — D649 Anemia, unspecified: Secondary | ICD-10-CM

## 2023-12-14 ENCOUNTER — Other Ambulatory Visit: Payer: Self-pay | Admitting: Family Medicine

## 2023-12-14 DIAGNOSIS — M546 Pain in thoracic spine: Secondary | ICD-10-CM

## 2023-12-25 ENCOUNTER — Other Ambulatory Visit: Payer: Self-pay | Admitting: Family Medicine

## 2023-12-25 ENCOUNTER — Encounter: Payer: Self-pay | Admitting: Family Medicine

## 2023-12-25 DIAGNOSIS — R109 Unspecified abdominal pain: Secondary | ICD-10-CM

## 2023-12-25 DIAGNOSIS — R1012 Left upper quadrant pain: Secondary | ICD-10-CM

## 2024-01-04 ENCOUNTER — Ambulatory Visit
Admission: RE | Admit: 2024-01-04 | Discharge: 2024-01-04 | Disposition: A | Discharge status: Home / Self Care | Source: Ambulatory Visit | Attending: Family Medicine | Admitting: Family Medicine

## 2024-01-04 DIAGNOSIS — R1012 Left upper quadrant pain: Secondary | ICD-10-CM | POA: Diagnosis present

## 2024-01-04 DIAGNOSIS — R109 Unspecified abdominal pain: Secondary | ICD-10-CM | POA: Insufficient documentation

## 2024-01-06 ENCOUNTER — Ambulatory Visit: Payer: Self-pay | Admitting: Family Medicine

## 2024-01-14 ENCOUNTER — Ambulatory Visit: Admitting: Family Medicine

## 2024-03-24 ENCOUNTER — Other Ambulatory Visit: Payer: Self-pay | Admitting: Neurology

## 2024-03-24 DIAGNOSIS — R413 Other amnesia: Secondary | ICD-10-CM

## 2024-04-02 ENCOUNTER — Ambulatory Visit: Admission: RE | Admit: 2024-04-02 | Source: Ambulatory Visit

## 2024-04-07 ENCOUNTER — Ambulatory Visit
Admission: RE | Admit: 2024-04-07 | Discharge: 2024-04-07 | Disposition: A | Discharge status: Home / Self Care | Source: Ambulatory Visit | Attending: Neurology | Admitting: Neurology

## 2024-04-07 DIAGNOSIS — R413 Other amnesia: Secondary | ICD-10-CM | POA: Diagnosis present

## 2024-04-07 MED ORDER — GADOBUTROL 1 MMOL/ML IV SOLN
9.0000 mL | Freq: Once | INTRAVENOUS | Status: AC | PRN
  Administered 2024-04-07: 9 mL via INTRAVENOUS

## 2024-05-14 ENCOUNTER — Other Ambulatory Visit: Payer: Self-pay | Admitting: Family Medicine

## 2024-05-14 DIAGNOSIS — R110 Nausea: Secondary | ICD-10-CM

## 2024-05-14 DIAGNOSIS — R1013 Epigastric pain: Secondary | ICD-10-CM

## 2024-05-14 DIAGNOSIS — K219 Gastro-esophageal reflux disease without esophagitis: Secondary | ICD-10-CM

## 2024-05-18 ENCOUNTER — Ambulatory Visit: Admitting: Family Medicine

## 2024-05-18 ENCOUNTER — Encounter: Payer: Self-pay | Admitting: Family Medicine

## 2024-05-18 VITALS — BP 126/70 | HR 88 | Resp 16 | Ht 61.0 in | Wt 204.2 lb

## 2024-05-18 DIAGNOSIS — G4733 Obstructive sleep apnea (adult) (pediatric): Secondary | ICD-10-CM

## 2024-05-18 DIAGNOSIS — I10 Essential (primary) hypertension: Secondary | ICD-10-CM

## 2024-05-18 DIAGNOSIS — E039 Hypothyroidism, unspecified: Secondary | ICD-10-CM

## 2024-05-18 DIAGNOSIS — J302 Other seasonal allergic rhinitis: Secondary | ICD-10-CM | POA: Diagnosis not present

## 2024-05-18 DIAGNOSIS — K219 Gastro-esophageal reflux disease without esophagitis: Secondary | ICD-10-CM | POA: Diagnosis not present

## 2024-05-18 DIAGNOSIS — Z23 Encounter for immunization: Secondary | ICD-10-CM | POA: Diagnosis not present

## 2024-05-18 DIAGNOSIS — G44229 Chronic tension-type headache, not intractable: Secondary | ICD-10-CM

## 2024-05-18 MED ORDER — FAMOTIDINE 20 MG PO TABS
20.0000 mg | ORAL_TABLET | Freq: Every day | ORAL | 3 refills | Status: AC
Start: 2024-05-18 — End: ?

## 2024-05-18 MED ORDER — METOPROLOL SUCCINATE ER 25 MG PO TB24
25.0000 mg | ORAL_TABLET | Freq: Every day | ORAL | 3 refills | Status: AC
Start: 2024-05-18 — End: ?

## 2024-05-18 MED ORDER — FLUTICASONE PROPIONATE 50 MCG/ACT NA SUSP
1.0000 | NASAL | 1 refills | Status: AC | PRN
Start: 2024-05-18 — End: ?

## 2024-05-18 MED ORDER — HYDROCHLOROTHIAZIDE 12.5 MG PO TABS
12.5000 mg | ORAL_TABLET | Freq: Every day | ORAL | 3 refills | Status: AC
Start: 2024-05-18 — End: ?

## 2024-05-18 NOTE — Progress Notes (Signed)
 Name: [name]   MRN: [id number]    DOB: Jan 05, 1976   Date:05/18/2024       Progress Note  Subjective  Chief Complaint  Chief Complaint  Patient presents with   Medical Management of Chronic Issues   Discussed the use of AI scribe software for clinical note transcription with the patient, who gave verbal consent to proceed.  History of Present Illness [name] is a 48 year old female with hypertension and hyperlipidemia who presents for a follow-up visit and baseline EKG.  She is here for a follow-up visit and to establish a new baseline EKG. Her previous EKG showed some inverted T waves, which were also present in past EKGs.  She has a history of hyperlipidemia, with her LDL cholesterol increasing from 119 to 140, while her HDL cholesterol improved from 47 to 51. She has reduced her intake of fried foods.  She is currently taking famotidine  for reflux, which she uses daily. She experiences recent burning in her stomach and is currently out of her medication. She avoids spicy foods, sodas, and caffeine.  She takes fluticasone  as needed for allergies and requests a refill. Her allergies are seasonal, with symptoms more prevalent in the fall.  For hypertension, she is on hydrochlorothiazide  12.5 mg and metoprolol  25 mg. No chest pain, palpitations, or shortness of breath with activity. She also reports no daily headaches, which she used to experience.  She has a history of thyroid  issues and is on levothyroxine 75 mcg, managed by another provider. Her last thyroid  level check was in May, showing a TSH of 3.61.  She has a family history of early onset Alzheimer's disease and has noticed some memory changes. She has seen a neurologist and had an MRI of her brain. She has not yet undergone cognitive testing.  She has obstructive sleep apnea and uses a CPAP machine nightly, which she feels helps her focus.  She is currently participating in Weight Watchers to address  her weight. She has cut out sodas from her diet.    Patient Active Problem List   Diagnosis Date Noted   Polyp of ascending colon    OSA (obstructive sleep apnea) 11/17/2017   Hypersomnia with sleep apnea 11/17/2017   Chronic tension-type headache, not intractable 05/20/2017   Essential hypertension 05/20/2017   Intermittent low back pain 04/21/2017   GERD (gastroesophageal reflux disease) 03/12/2016   Goiter 03/12/2016   MVP (mitral valve prolapse) 02/15/2015   History of iron deficiency anemia 02/15/2015   Vitamin D  deficiency 01/11/2015   Vitamin B12 deficiency 01/11/2015   Pelvic pain 10/26/2012   Heart murmur 12/29/2011   Fibroid uterus 12/29/2011   Family history of breast cancer 12/29/2011    Past Surgical History:  Procedure Laterality Date   BREAST BIOPSY Left 11/17/2019   BENIGN BREAST TISSUE WITH PSEUDOANGIOMATOUS X Clip   COLONOSCOPY WITH PROPOFOL  N/A 03/01/2021   Procedure: COLONOSCOPY WITH PROPOFOL ;  Surgeon: Janalyn Keene NOVAK, MD;  Location: [facility] ENDOSCOPY;  Service: Endoscopy;  Laterality: N/A;   LEEP     WISDOM TOOTH EXTRACTION     x4    Family History  Problem Relation Age of Onset   Cancer Mother 17       deceased at age 44 (breast)   Diabetes Mother    Hypertension Mother    Breast cancer Mother 7   Cancer Maternal Aunt 81       breast   Breast cancer Maternal Aunt 31   Cancer  Maternal Grandmother 66       pancreatic   Cancer Maternal Grandfather 67       throat   Hypertension Father    Cancer Father 76       prostate   Hypertension Brother    Heart disease Paternal Grandmother        heart attack   Heart disease Paternal Grandfather        heart attack.    Social History   Tobacco Use   Smoking status: Never   Smokeless tobacco: Never  Substance Use Topics   Alcohol use: Yes    Comment: occasionally     Current Outpatient Medications:    Cholecalciferol (VITAMIN D ) 2000 units CAPS, Take by mouth., Disp: , Rfl:     famotidine  (PEPCID ) 20 MG tablet, Take 1 tablet (20 mg total) by mouth daily., Disp: 90 tablet, Rfl: 3   fluticasone  (FLONASE ) 50 MCG/ACT nasal spray, Place 1 spray into both nostrils as needed., Disp: 48 g, Rfl: 1   hydrochlorothiazide  (HYDRODIURIL ) 12.5 MG tablet, Take 1 tablet (12.5 mg total) by mouth daily., Disp: 90 tablet, Rfl: 1   levothyroxine (SYNTHROID) 75 MCG tablet, Take 75 mcg by mouth daily before breakfast., Disp: , Rfl:    metoprolol  succinate (TOPROL -XL) 25 MG 24 hr tablet, Take 1 tablet (25 mg total) by mouth daily., Disp: 90 tablet, Rfl: 1  No Known Allergies  I personally reviewed active problem list, medication list, allergies, family history with the patient/caregiver today.   ROS  Ten systems reviewed and is negative except as mentioned in HPI    Objective Physical Exam VITALS: BP- 126/70 MEASUREMENTS: BMI- 35.0. CONSTITUTIONAL: Patient appears well-developed and well-nourished.  No distress. HEENT: Head atraumatic, normocephalic, neck supple. CARDIOVASCULAR: Normal rate, regular rhythm and normal heart sounds.  No murmur heard. No BLE edema. PULMONARY: Effort normal and breath sounds normal. No respiratory distress. ABDOMINAL: There is no tenderness or distention. MUSCULOSKELETAL: Normal gait. Without gross motor or sensory deficit. PSYCHIATRIC: Patient has a normal mood and affect. behavior is normal. Judgment and thought content normal.  Vitals:   05/18/24 1434  BP: 126/70  Pulse: 88  Resp: 16  SpO2: 99%  Weight: 204 lb 3.2 oz (92.6 kg)  Height: 5' 1 (1.549 m)    Body mass index is 38.58 kg/m.   PHQ2/9:    05/18/2024    2:29 PM 11/16/2023    2:30 PM 08/25/2023    2:40 PM 10/03/2022    3:12 PM 05/28/2022   10:04 AM  Depression screen PHQ 2/9  Decreased Interest 0 0 0 0 0  Down, Depressed, Hopeless 0 0 0 0 0  PHQ - 2 Score 0 0 0 0 0  Altered sleeping   0 0 0  Tired, decreased energy   0 0 3  Change in appetite   0 0 0  Feeling bad or  failure about yourself    0 0 0  Trouble concentrating   0 0 0  Moving slowly or fidgety/restless   0 0 0  Suicidal thoughts   0 0 0  PHQ-9 Score   0  0  3   Difficult doing work/chores   Not difficult at all       Data saved with a previous flowsheet row definition    phq 9 is negative   Fall Risk:    05/18/2024    2:29 PM 11/16/2023    2:26 PM 08/25/2023    2:40 PM 10/03/2022  3:12 PM 05/28/2022   10:04 AM  Fall Risk   Falls in the past year? 0 0 0 0 0  Number falls in past yr: 0 0 0  0  Injury with Fall? 0 0 0  0  Risk for fall due to : No Fall Risks No Fall Risks No Fall Risks No Fall Risks No Fall Risks  Follow up Falls evaluation completed Falls prevention discussed;Education provided;Falls evaluation completed Falls prevention discussed;Education provided;Falls evaluation completed Falls prevention discussed Falls prevention discussed      Data saved with a previous flowsheet row definition      Assessment & Plan Gastroesophageal reflux disease Intermittent burning sensation likely due to GERD, managed with famotidine . - Prescribed famotidine : daily for a week, then every other day, then as needed. - Advised avoiding spicy foods, sodas, caffeine.  Morbid obesity BMI over 35. Discussed weight impact on hypertension and cholesterol. Started Weight Watchers. - Encouraged continuation of Weight Watchers. - Discussed weight loss benefits on blood pressure and cholesterol.  Essential hypertension Blood pressure controlled at 126/70 mmHg with current regimen. - Prescribed hydrochlorothiazide  and metoprolol  for one year.  Pure hypercholesterolemia LDL increased to 140 mg/dL, HDL improved to 51 mg/dL, triglycerides normal. - Advised dietary changes: avoid fried foods, increase fruits, vegetables, fish, reduce red meat.  Obstructive sleep apnea Managed with CPAP. Discussed modafinil  for residual symptoms, declined. - Continue CPAP therapy.  Seasonal allergic  rhinitis Managed with fluticasone  as needed. - Prescribed six-month supply of fluticasone .  Vitamin D  deficiency Vitamin D  slightly below normal. - Continue vitamin D  2000 units daily.  Memory changes, under evaluation Memory changes with family history of Alzheimer's. Neurologist does not suspect Alzheimer's. - Encouraged physical activity and healthy lifestyle. - Provided cognitive testing referral to [facility].  General Health Maintenance Due for pneumonia vaccine and flu shot. Discussed vaccination importance. - Administered flu shot. - Offered pneumonia vaccine.

## 2024-11-18 ENCOUNTER — Encounter: Admitting: Family Medicine

## 2025-05-19 ENCOUNTER — Ambulatory Visit: Admitting: Family Medicine
